# Patient Record
Sex: Female | Born: 1955
Health system: Southern US, Community
[De-identification: ages and names within clinical notes are randomized; demographics above are authoritative.]

## PROBLEM LIST (undated history)

## (undated) DIAGNOSIS — H3500 Unspecified background retinopathy: Secondary | ICD-10-CM

## (undated) DIAGNOSIS — I10 Essential (primary) hypertension: Secondary | ICD-10-CM

## (undated) DIAGNOSIS — E785 Hyperlipidemia, unspecified: Secondary | ICD-10-CM

## (undated) DIAGNOSIS — E119 Type 2 diabetes mellitus without complications: Secondary | ICD-10-CM

## (undated) DIAGNOSIS — M1990 Unspecified osteoarthritis, unspecified site: Secondary | ICD-10-CM

## (undated) DIAGNOSIS — Z8489 Family history of other specified conditions: Secondary | ICD-10-CM

## (undated) DIAGNOSIS — E049 Nontoxic goiter, unspecified: Secondary | ICD-10-CM

## (undated) DIAGNOSIS — Z9889 Other specified postprocedural states: Secondary | ICD-10-CM

## (undated) DIAGNOSIS — R112 Nausea with vomiting, unspecified: Secondary | ICD-10-CM

## (undated) HISTORY — PX: EYE SURGERY: SHX253

## (undated) HISTORY — PX: TUBAL LIGATION: SHX77

## (undated) HISTORY — PX: TONSILLECTOMY: SUR1361

## (undated) HISTORY — PX: ABDOMINAL HYSTERECTOMY: SHX81

## (undated) HISTORY — PX: BACK SURGERY: SHX140

## (undated) HISTORY — DX: Hyperlipidemia, unspecified: E78.5

---

## 2002-04-25 ENCOUNTER — Encounter: Admission: RE | Admit: 2002-04-25 | Discharge: 2002-04-25 | Payer: Self-pay | Admitting: Unknown Physician Specialty

## 2002-04-25 ENCOUNTER — Encounter: Payer: Self-pay | Admitting: Unknown Physician Specialty

## 2002-05-08 ENCOUNTER — Ambulatory Visit (HOSPITAL_COMMUNITY): Admission: RE | Admit: 2002-05-08 | Discharge: 2002-05-08 | Payer: Self-pay | Admitting: Neurosurgery

## 2002-05-08 ENCOUNTER — Encounter: Payer: Self-pay | Admitting: Neurosurgery

## 2012-04-03 HISTORY — PX: CATARACT EXTRACTION: SUR2

## 2013-11-03 ENCOUNTER — Other Ambulatory Visit (HOSPITAL_COMMUNITY): Payer: Self-pay

## 2013-11-14 ENCOUNTER — Inpatient Hospital Stay: Admit: 2013-11-14 | Payer: Self-pay | Admitting: Orthopedic Surgery

## 2013-11-14 SURGERY — ARTHROPLASTY, HIP, TOTAL,POSTERIOR APPROACH
Anesthesia: General | Laterality: Right

## 2013-11-20 ENCOUNTER — Other Ambulatory Visit (HOSPITAL_COMMUNITY): Payer: Self-pay | Admitting: Internal Medicine

## 2013-11-20 DIAGNOSIS — Z139 Encounter for screening, unspecified: Secondary | ICD-10-CM

## 2013-11-20 DIAGNOSIS — Z13820 Encounter for screening for osteoporosis: Secondary | ICD-10-CM

## 2013-11-26 ENCOUNTER — Other Ambulatory Visit (HOSPITAL_COMMUNITY): Payer: Self-pay

## 2014-04-02 NOTE — H&P (Signed)
Braylee Lal/[facility] [address] [place], [place] [address] [phone] A Division of [facility]  Ninetta Lights, M.D.   Robert A. Noemi Chapel, M.D.   Faythe Casa, M.D.   Johnny Bridge, M.D.   Almedia Balls, M.D Ernesta Amble. Percell Miller, M.D.  Joseph Pierini, M.D.  Lanier Prude, M.D.    Verner Chol, M.D. Mary L. Fenton Malling, PA-C  Kirstin A. Shepperson, PA-C  Josh Little [place], PA-C [place], [place]                                                                    RE: [name], [name]                                    [id number]      DOB: October 26, 1955 PROGRESS NOTE: 03-23-14 History of present illness: Patient presents for follow up of right hip and right low back pain.  She is a 58 year-old female with a history of low back diskectomy in 2004.  In the last several months she has been having right hip pain which started about a year ago.  In May she saw Dr. French Ana who referred for intraarticular right hip injections to treat and help control pain for her severe osteoarthritis of the hip.  Patient reports good clinical response to the injection in May with reduction of pain and improvement in function for at least three months.  She had a repeat injection in early November that she reports she did not get any clinical benefit from.  She had a reduction in some of the sharp pain in her right hip with this injection, but continues to have a dull ache which she localizes to her buttocks and lateral hip joint.  Patient is interested in pursuing total hip replacement as this was the plan over the summer and this was postponed.   Review of systems: Otherwise negative.   Please see associated documentation for this clinic visit for further past medical, family, surgical and social history, review of systems, and exam findings as this was reviewed by me.  EXAMINATION: Well-developed, well-nourished female.  Alert and oriented x 3.   Examination of her right lower extremity reveals pain with hip grind.  Mild decreased range of motion in flexion of the hip.  Normal in extension.  She has a negative straight leg raise with no radiculopathy.  No numbness or tingling into the leg.  Patient has global weakness bilaterally with hip flexion, extension, internal and external rotation, about 4/5.  No significant tenderness to palpation over the greater trochanter, buttocks or ischial tuberosity.  Neurovascularly intact with normal dorsal pedal pulses and sensation.  Although her muscles are globally weak from deconditioning, she has symmetric strength   X-RAYS: Review of patient' previous x-rays of her right hip reveal severe joint space narrowing and degenerative joint changes with CAM and pincer deformities.    ASSESSMENT: Subsequent visit for chronic non-traumatic primary right hip end stage degenerative joint disease.    PLAN: Discussed at length with patient that based on her clinical history and clinical diagnostic improvement from previous hip injection, this is  likely the source of her pain.  We discussed the process of going through hip replacement surgery and recovery.  Patient verbalized understanding of the risks and benefits of this procedure.  She will obtain clearance from her primary care physician before proceeding with surgery.  We will setup a surgery date today and patient will follow up for her pre-op visit in the next couple of months.    Ernesta Amble.  Percell Miller, M.D.  Electronically verified by Ernesta Amble. Percell Miller, M.D. TDM(DMD):jjh D 03-24-14 T 03-25-14

## 2014-04-10 ENCOUNTER — Encounter (HOSPITAL_COMMUNITY): Payer: Self-pay

## 2014-04-10 ENCOUNTER — Encounter (HOSPITAL_COMMUNITY)
Admission: RE | Admit: 2014-04-10 | Discharge: 2014-04-10 | Disposition: A | Discharge status: Home / Self Care | Payer: 59 | Source: Ambulatory Visit | Attending: Orthopedic Surgery | Admitting: Orthopedic Surgery

## 2014-04-10 DIAGNOSIS — I252 Old myocardial infarction: Secondary | ICD-10-CM | POA: Diagnosis not present

## 2014-04-10 DIAGNOSIS — R000 Tachycardia, unspecified: Secondary | ICD-10-CM | POA: Insufficient documentation

## 2014-04-10 DIAGNOSIS — E119 Type 2 diabetes mellitus without complications: Secondary | ICD-10-CM | POA: Diagnosis not present

## 2014-04-10 DIAGNOSIS — I10 Essential (primary) hypertension: Secondary | ICD-10-CM | POA: Diagnosis not present

## 2014-04-10 DIAGNOSIS — Z01818 Encounter for other preprocedural examination: Secondary | ICD-10-CM | POA: Diagnosis present

## 2014-04-10 DIAGNOSIS — N390 Urinary tract infection, site not specified: Secondary | ICD-10-CM | POA: Insufficient documentation

## 2014-04-10 HISTORY — DX: Essential (primary) hypertension: I10

## 2014-04-10 HISTORY — DX: Other specified postprocedural states: R11.2

## 2014-04-10 HISTORY — DX: Other specified postprocedural states: Z98.890

## 2014-04-10 HISTORY — DX: Unspecified osteoarthritis, unspecified site: M19.90

## 2014-04-10 HISTORY — DX: Nontoxic goiter, unspecified: E04.9

## 2014-04-10 HISTORY — DX: Type 2 diabetes mellitus without complications: E11.9

## 2014-04-10 LAB — PROTIME-INR
INR: 0.96 (ref 0.00–1.49)
Prothrombin Time: 12.8 seconds (ref 11.6–15.2)

## 2014-04-10 LAB — CBC
HEMATOCRIT: 41.7 % (ref 36.0–46.0)
Hemoglobin: 13.7 g/dL (ref 12.0–15.0)
MCH: 29.3 pg (ref 26.0–34.0)
MCHC: 32.9 g/dL (ref 30.0–36.0)
MCV: 89.3 fL (ref 78.0–100.0)
PLATELETS: 341 10*3/uL (ref 150–400)
RBC: 4.67 MIL/uL (ref 3.87–5.11)
RDW: 13.8 % (ref 11.5–15.5)
WBC: 10.1 10*3/uL (ref 4.0–10.5)

## 2014-04-10 LAB — URINALYSIS, ROUTINE W REFLEX MICROSCOPIC
Bilirubin Urine: NEGATIVE
GLUCOSE, UA: NEGATIVE mg/dL
HGB URINE DIPSTICK: NEGATIVE
Ketones, ur: 15 mg/dL — AB
NITRITE: POSITIVE — AB
PROTEIN: NEGATIVE mg/dL
Specific Gravity, Urine: 1.019 (ref 1.005–1.030)
Urobilinogen, UA: 0.2 mg/dL (ref 0.0–1.0)
pH: 5.5 (ref 5.0–8.0)

## 2014-04-10 LAB — BASIC METABOLIC PANEL
ANION GAP: 14 (ref 5–15)
BUN: 14 mg/dL (ref 6–23)
CO2: 27 mmol/L (ref 19–32)
Calcium: 9.9 mg/dL (ref 8.4–10.5)
Chloride: 97 mEq/L (ref 96–112)
Creatinine, Ser: 0.8 mg/dL (ref 0.50–1.10)
GFR calc Af Amer: >90 mL/min (ref >90)
GFR calc non Af Amer: 79 mL/min — ABNORMAL LOW (ref >90)
Glucose, Bld: 104 mg/dL — ABNORMAL HIGH (ref 70–99)
POTASSIUM: 3.6 mmol/L (ref 3.5–5.1)
Sodium: 138 mmol/L (ref 135–145)

## 2014-04-10 LAB — URINE MICROSCOPIC-ADD ON

## 2014-04-10 LAB — SURGICAL PCR SCREEN
MRSA, PCR: NEGATIVE
STAPHYLOCOCCUS AUREUS: NEGATIVE

## 2014-04-10 LAB — TYPE AND SCREEN
ABO/RH(D): A NEG
Antibody Screen: NEGATIVE

## 2014-04-10 LAB — APTT: aPTT: 30 seconds (ref 24–37)

## 2014-04-10 LAB — ABO/RH: ABO/RH(D): A NEG

## 2014-04-10 NOTE — Progress Notes (Signed)
Pcp is Dr Delphina Cahill Denies seeing a Cardiologist. Denies having a stress test, echo, or card cath. Denies having a recent Ekg or CXR Reports her cbg's run 88-120 fasting

## 2014-04-10 NOTE — Pre-Procedure Instructions (Signed)
[name]  04/10/2014   Your procedure is scheduled on:  Jan 19 at 730  Report to [facility] Admitting at 530 AM.  Call this number if you have problems the morning of surgery: [phone]   Remember:   Do not eat food or drink liquids after midnight.   Take these medicines the morning of surgery with A SIP OF WATER: Tylenol if needed for pain  Stop taking aspirin, Aleve, Ibuprofen, BC's, Goody's, Herbal medications, and Fish Oil   Do not wear jewelry, make-up or nail polish.  Do not wear lotions, powders, or perfumes. You may wear deodorant.  Do not shave 48 hours prior to surgery. Men may shave face and neck.  Do not bring valuables to the hospital.  [facility] is not responsible  for any belongings or valuables.               Contacts, dentures or bridgework may not be worn into surgery.  Leave suitcase in the car. After surgery it may be brought to your room.  For patients admitted to the hospital, discharge time is determined by your treatment team.               Patients discharged the day of surgery will not be allowed to drive home.    Special Instructions: [place] - Preparing for Surgery  Before surgery, you can play an important role.  Because skin is not sterile, your skin needs to be as free of germs as possible.  You can reduce the number of germs on you skin by washing with CHG (chlorahexidine gluconate) soap before surgery.  CHG is an antiseptic cleaner which kills germs and bonds with the skin to continue killing germs even after washing.  Please DO NOT use if you have an allergy to CHG or antibacterial soaps.  If your skin becomes reddened/irritated stop using the CHG and inform your nurse when you arrive at Short Stay.  Do not shave (including legs and underarms) for at least 48 hours prior to the first CHG shower.  You may shave your face.  Please follow these instructions carefully:   1.  Shower with CHG Soap the night before surgery and  the  morning of Surgery.  2.  If you choose to wash your hair, wash your hair first as usual with your normal shampoo.  3.  After you shampoo, rinse your hair and body thoroughly to remove the Shampoo.  4.  Use CHG as you would any other liquid soap.  You can apply chg directly to the skin and wash gently with scrungie or a clean washcloth.  5.  Apply the CHG Soap to your body ONLY FROM THE NECK DOWN.  Do not use on open wounds or open sores.  Avoid contact with your eyes, ears, mouth and genitals (private parts).  Wash genitals (private parts)  with your normal soap.  6.  Wash thoroughly, paying special attention to the area where your surgery  will be performed.  7.  Thoroughly rinse your body with warm water from the neck down.  8.  DO NOT shower/wash with your normal soap after using and rinsing off  the CHG Soap.  9.  Pat yourself dry with a clean towel.            10.  Wear clean pajamas.            11.  Place clean sheets on your bed the night of  your first shower and do not sleep with pets.  Day of Surgery  Do not apply any lotions/deoderants the morning of surgery.  Please wear clean clothes to the hospital/surgery center.      Please read over the following fact sheets that you were given: Pain Booklet, Coughing and Deep Breathing, Blood Transfusion Information, MRSA Information and Surgical Site Infection Prevention

## 2014-04-13 NOTE — Progress Notes (Addendum)
Anesthesia Chart Review:  Pt is 59 year old female scheduled for R total hip arthroplasty on 04/21/2014 with Dr. Alain Marion.   PCP is Dr. Delphina Cahill in [place].   PMH includes: DM, HTN.   Medication includes: lisinopril, glipizide, Janumet  Preoperative labs reviewed.  Pt appears to have UTI. Notified Kelly in Dr. Debroah Loop office.   EKG: Sinus tachycardia. Possible Left atrial enlargement. Possible Inferior infarct , age undetermined. No previous available in our system for comparison. Have requested EKG from PCP's office.   Willeen Cass, FNP-BC [facility] Short Stay Surgical Center/Anesthesiology Phone: [phone] 04/13/2014 4:36 PM  Addendum:   Called and spoke with pt. She denies any SOB or chest tightness/pain with activity.   No old EKG is available.   Pt has medical clearance from PCP on chart.   Discussed with Dr. Glennon Mac.  If no changes, I anticipate pt can proceed with surgery as scheduled.   Willeen Cass, FNP-BC [facility] Short Stay Surgical Center/Anesthesiology Phone: [phone] 04/15/2014 4:26 PM  Addendum:  Received 01/31/11 EKG from the [facility].  EKG from 01/31/11 also showed possible inferior infarct and poor anterior r wave progression.  George Hugh [facility] Short Stay Center/Anesthesiology Phone [phone] 04/17/2014 11:46 AM

## 2014-04-14 ENCOUNTER — Encounter (HOSPITAL_COMMUNITY): Payer: Self-pay

## 2014-04-20 MED ORDER — ACETAMINOPHEN 500 MG PO TABS
1000.0000 mg | ORAL_TABLET | Freq: Once | ORAL | Status: AC
  Administered 2014-04-21: 1000 mg via ORAL
  Filled 2014-04-20: qty 2

## 2014-04-20 MED ORDER — DEXTROSE-NACL 5-0.45 % IV SOLN: 100.0000 mL/h | INTRAVENOUS | Status: DC

## 2014-04-20 MED ORDER — CEFAZOLIN SODIUM-DEXTROSE 2-3 GM-% IV SOLR
2.0000 g | INTRAVENOUS | Status: AC
Start: 2014-04-21 — End: 2014-04-21
  Administered 2014-04-21: 2 g via INTRAVENOUS
  Filled 2014-04-20: qty 50

## 2014-04-20 NOTE — Anesthesia Preprocedure Evaluation (Addendum)
Anesthesia Evaluation  Patient identified by MRN, date of birth, ID band Patient awake    Reviewed: Allergy & Precautions, NPO status , Patient's Chart, lab work & pertinent test results, reviewed documented beta blocker date and time   History of Anesthesia Complications (+) PONV  Airway Mallampati: III  TM Distance: <3 FB Neck ROM: Limited  Mouth opening: Limited Mouth Opening  Dental  (+) Dental Advisory Given Pt with large goiter that causes tracheal deviation of the right- she has not received any work up or treatment since it was first diagnosed in 1998 as per note from [place] [facility] department pt declined soft tissue untrasound of neck 06/2013- discussed AW and anesthetic concerns with Dr Tresa Moore and Dr Percell Miller- both agree to proceed with case:   Pulmonary neg pulmonary ROS,          Cardiovascular hypertension, Pt. on medications Rhythm:Regular     Neuro/Psych negative neurological ROS     GI/Hepatic Neg liver ROS,   Endo/Other  diabetes, Type 2  Renal/GU UA possible UTI     Musculoskeletal   Abdominal (+)  Abdomen: soft.    Peds  Hematology   Anesthesia Other Findings   Reproductive/Obstetrics                          Anesthesia Physical Anesthesia Plan  ASA: II  Anesthesia Plan: Regional   Post-op Pain Management:    Induction:   Airway Management Planned:   Additional Equipment:   Intra-op Plan:   Post-operative Plan:   Informed Consent: I have reviewed the patients History and Physical, chart, labs and discussed the procedure including the risks, benefits and alternatives for the proposed anesthesia with the patient or authorized representative who has indicated his/her understanding and acceptance.     Plan Discussed with:   Anesthesia Plan Comments: (Pre op lab possible for UTI, need to see if it has been addressed- spinal as concerns -  difficult AW due to Goiter- pt reports finished ABX for UTI)       Anesthesia Quick Evaluation

## 2014-04-21 ENCOUNTER — Inpatient Hospital Stay (HOSPITAL_COMMUNITY)
Admission: RE | Admit: 2014-04-21 | Discharge: 2014-04-22 | DRG: 470 | Disposition: A | Discharge status: Home Health | Payer: 59 | Source: Ambulatory Visit | Attending: Orthopedic Surgery | Admitting: Orthopedic Surgery

## 2014-04-21 ENCOUNTER — Inpatient Hospital Stay (HOSPITAL_COMMUNITY): Payer: 59 | Admitting: Certified Registered"

## 2014-04-21 ENCOUNTER — Encounter (HOSPITAL_COMMUNITY): Payer: Self-pay | Admitting: *Deleted

## 2014-04-21 ENCOUNTER — Inpatient Hospital Stay (HOSPITAL_COMMUNITY): Payer: 59 | Admitting: Emergency Medicine

## 2014-04-21 ENCOUNTER — Encounter (HOSPITAL_COMMUNITY)
Admission: RE | Disposition: A | Discharge status: Home Health | Payer: Self-pay | Source: Ambulatory Visit | Attending: Orthopedic Surgery

## 2014-04-21 ENCOUNTER — Inpatient Hospital Stay (HOSPITAL_COMMUNITY): Payer: 59

## 2014-04-21 DIAGNOSIS — Z7982 Long term (current) use of aspirin: Secondary | ICD-10-CM

## 2014-04-21 DIAGNOSIS — M1611 Unilateral primary osteoarthritis, right hip: Principal | ICD-10-CM | POA: Diagnosis present

## 2014-04-21 DIAGNOSIS — I10 Essential (primary) hypertension: Secondary | ICD-10-CM | POA: Diagnosis present

## 2014-04-21 DIAGNOSIS — M1990 Unspecified osteoarthritis, unspecified site: Secondary | ICD-10-CM | POA: Diagnosis present

## 2014-04-21 DIAGNOSIS — Z79899 Other long term (current) drug therapy: Secondary | ICD-10-CM | POA: Diagnosis not present

## 2014-04-21 DIAGNOSIS — Z9889 Other specified postprocedural states: Secondary | ICD-10-CM

## 2014-04-21 DIAGNOSIS — E119 Type 2 diabetes mellitus without complications: Secondary | ICD-10-CM | POA: Diagnosis present

## 2014-04-21 DIAGNOSIS — M25551 Pain in right hip: Secondary | ICD-10-CM | POA: Diagnosis present

## 2014-04-21 HISTORY — PX: TOTAL HIP ARTHROPLASTY: SHX124

## 2014-04-21 HISTORY — DX: Unspecified background retinopathy: H35.00

## 2014-04-21 LAB — GLUCOSE, CAPILLARY
GLUCOSE-CAPILLARY: 135 mg/dL — AB (ref 70–99)
GLUCOSE-CAPILLARY: 308 mg/dL — AB (ref 70–99)
GLUCOSE-CAPILLARY: 230 mg/dL — AB (ref 70–99)
Glucose-Capillary: 144 mg/dL — ABNORMAL HIGH (ref 70–99)
Glucose-Capillary: 220 mg/dL — ABNORMAL HIGH (ref 70–99)

## 2014-04-21 SURGERY — ARTHROPLASTY, HIP, TOTAL, ANTERIOR APPROACH
Anesthesia: Spinal | Site: Hip | Laterality: Right

## 2014-04-21 MED ORDER — MEPERIDINE HCL 25 MG/ML IJ SOLN: 6.2500 mg | INTRAMUSCULAR | Status: DC | PRN

## 2014-04-21 MED ORDER — METOCLOPRAMIDE HCL 10 MG PO TABS: 5.0000 mg | ORAL_TABLET | Freq: Three times a day (TID) | ORAL | Status: DC | PRN

## 2014-04-21 MED ORDER — DEXAMETHASONE SODIUM PHOSPHATE 10 MG/ML IJ SOLN
10.0000 mg | Freq: Once | INTRAMUSCULAR | Status: AC
  Administered 2014-04-22: 10 mg via INTRAVENOUS
  Filled 2014-04-21: qty 1

## 2014-04-21 MED ORDER — METHOCARBAMOL 1000 MG/10ML IJ SOLN
500.0000 mg | INTRAVENOUS | Status: DC
  Filled 2014-04-21: qty 5

## 2014-04-21 MED ORDER — MENTHOL 3 MG MT LOZG: 1.0000 | LOZENGE | OROMUCOSAL | Status: DC | PRN

## 2014-04-21 MED ORDER — EPHEDRINE SULFATE 50 MG/ML IJ SOLN
INTRAMUSCULAR | Status: AC
  Filled 2014-04-21: qty 1

## 2014-04-21 MED ORDER — ASPIRIN EC 325 MG PO TBEC
325.0000 mg | DELAYED_RELEASE_TABLET | Freq: Every day | ORAL | Status: DC
  Administered 2014-04-22: 325 mg via ORAL
  Filled 2014-04-21 (×2): qty 1

## 2014-04-21 MED ORDER — METFORMIN HCL 500 MG PO TABS
1000.0000 mg | ORAL_TABLET | Freq: Two times a day (BID) | ORAL | Status: DC
  Administered 2014-04-21 – 2014-04-22 (×2): 1000 mg via ORAL
  Filled 2014-04-21 (×4): qty 2

## 2014-04-21 MED ORDER — LACTATED RINGERS IV SOLN
INTRAVENOUS | Status: DC | PRN
  Administered 2014-04-21 (×3): via INTRAVENOUS

## 2014-04-21 MED ORDER — SODIUM CHLORIDE 0.9 % IV SOLN
10.0000 mg | INTRAVENOUS | Status: DC | PRN
  Administered 2014-04-21: 20 ug/min via INTRAVENOUS

## 2014-04-21 MED ORDER — STERILE WATER FOR INJECTION IJ SOLN
INTRAMUSCULAR | Status: AC
  Filled 2014-04-21: qty 10

## 2014-04-21 MED ORDER — SCOPOLAMINE 1 MG/3DAYS TD PT72
1.0000 | MEDICATED_PATCH | TRANSDERMAL | Status: DC
  Administered 2014-04-21: 1.5 mg via TRANSDERMAL

## 2014-04-21 MED ORDER — PROPOFOL INFUSION 10 MG/ML OPTIME
INTRAVENOUS | Status: DC | PRN
  Administered 2014-04-21: 25 ug/kg/min via INTRAVENOUS

## 2014-04-21 MED ORDER — ROCURONIUM BROMIDE 50 MG/5ML IV SOLN
INTRAVENOUS | Status: AC
  Filled 2014-04-21: qty 1

## 2014-04-21 MED ORDER — LIDOCAINE HCL (CARDIAC) 20 MG/ML IV SOLN
INTRAVENOUS | Status: AC
  Filled 2014-04-21: qty 5

## 2014-04-21 MED ORDER — SODIUM CHLORIDE 0.9 % IV SOLN
1000.0000 mg | INTRAVENOUS | Status: DC
  Filled 2014-04-21: qty 10

## 2014-04-21 MED ORDER — 0.9 % SODIUM CHLORIDE (POUR BTL) OPTIME
TOPICAL | Status: DC | PRN
  Administered 2014-04-21: 1000 mL

## 2014-04-21 MED ORDER — DOCUSATE SODIUM 100 MG PO CAPS: 100.0000 mg | ORAL_CAPSULE | Freq: Two times a day (BID) | ORAL | Status: DC

## 2014-04-21 MED ORDER — MELOXICAM 15 MG PO TABS: 15.0000 mg | ORAL_TABLET | Freq: Every day | ORAL | Status: DC

## 2014-04-21 MED ORDER — MIDAZOLAM HCL 5 MG/5ML IJ SOLN
INTRAMUSCULAR | Status: DC | PRN
  Administered 2014-04-21 (×2): 1 mg via INTRAVENOUS

## 2014-04-21 MED ORDER — MIDAZOLAM HCL 2 MG/2ML IJ SOLN
INTRAMUSCULAR | Status: AC
  Filled 2014-04-21: qty 2

## 2014-04-21 MED ORDER — SUCCINYLCHOLINE CHLORIDE 20 MG/ML IJ SOLN
INTRAMUSCULAR | Status: AC
  Filled 2014-04-21: qty 1

## 2014-04-21 MED ORDER — LINAGLIPTIN 5 MG PO TABS
5.0000 mg | ORAL_TABLET | Freq: Every day | ORAL | Status: DC
  Filled 2014-04-21 (×2): qty 1

## 2014-04-21 MED ORDER — HYDROCODONE-ACETAMINOPHEN 5-325 MG PO TABS
1.0000 | ORAL_TABLET | ORAL | Status: DC | PRN
  Administered 2014-04-21: 2 via ORAL
  Administered 2014-04-21: 1 via ORAL
  Administered 2014-04-22 (×4): 2 via ORAL
  Filled 2014-04-21: qty 1
  Filled 2014-04-21 (×5): qty 2

## 2014-04-21 MED ORDER — ONDANSETRON HCL 4 MG PO TABS: 4.0000 mg | ORAL_TABLET | Freq: Four times a day (QID) | ORAL | Status: DC | PRN

## 2014-04-21 MED ORDER — FENTANYL CITRATE 0.05 MG/ML IJ SOLN
INTRAMUSCULAR | Status: DC | PRN
  Administered 2014-04-21 (×10): 25 ug via INTRAVENOUS

## 2014-04-21 MED ORDER — SITAGLIPTIN PHOS-METFORMIN HCL 50-1000 MG PO TABS: 1.0000 | ORAL_TABLET | Freq: Two times a day (BID) | ORAL | Status: DC

## 2014-04-21 MED ORDER — METOCLOPRAMIDE HCL 5 MG/ML IJ SOLN
5.0000 mg | Freq: Three times a day (TID) | INTRAMUSCULAR | Status: DC | PRN
Start: 2014-04-21 — End: 2014-04-22

## 2014-04-21 MED ORDER — LISINOPRIL 40 MG PO TABS
40.0000 mg | ORAL_TABLET | Freq: Every day | ORAL | Status: DC
  Administered 2014-04-21 – 2014-04-22 (×2): 40 mg via ORAL
  Filled 2014-04-21 (×2): qty 1

## 2014-04-21 MED ORDER — ONDANSETRON HCL 4 MG/2ML IJ SOLN: 4.0000 mg | Freq: Four times a day (QID) | INTRAMUSCULAR | Status: DC | PRN

## 2014-04-21 MED ORDER — ALBUMIN HUMAN 5 % IV SOLN
INTRAVENOUS | Status: DC | PRN
  Administered 2014-04-21: 10:00:00 via INTRAVENOUS

## 2014-04-21 MED ORDER — SODIUM CHLORIDE 0.9 % IJ SOLN
INTRAMUSCULAR | Status: DC | PRN
  Administered 2014-04-21: 20 mL

## 2014-04-21 MED ORDER — DEXAMETHASONE SODIUM PHOSPHATE 10 MG/ML IJ SOLN
INTRAMUSCULAR | Status: DC | PRN
  Administered 2014-04-21: 10 mg via INTRAVENOUS

## 2014-04-21 MED ORDER — PROPOFOL 10 MG/ML IV BOLUS
INTRAVENOUS | Status: AC
  Filled 2014-04-21: qty 20

## 2014-04-21 MED ORDER — ACETAMINOPHEN 325 MG PO TABS: 650.0000 mg | ORAL_TABLET | Freq: Four times a day (QID) | ORAL | Status: DC | PRN

## 2014-04-21 MED ORDER — FENTANYL CITRATE 0.05 MG/ML IJ SOLN
INTRAMUSCULAR | Status: AC
  Administered 2014-04-21: 50 ug via INTRAVENOUS
  Filled 2014-04-21: qty 2

## 2014-04-21 MED ORDER — FENTANYL CITRATE 0.05 MG/ML IJ SOLN
INTRAMUSCULAR | Status: AC
  Filled 2014-04-21: qty 5

## 2014-04-21 MED ORDER — CHLORHEXIDINE GLUCONATE 4 % EX LIQD
60.0000 mL | Freq: Once | CUTANEOUS | Status: DC
  Filled 2014-04-21: qty 60

## 2014-04-21 MED ORDER — FENTANYL CITRATE 0.05 MG/ML IJ SOLN
25.0000 ug | INTRAMUSCULAR | Status: DC | PRN
  Administered 2014-04-21 (×2): 50 ug via INTRAVENOUS

## 2014-04-21 MED ORDER — BUPIVACAINE LIPOSOME 1.3 % IJ SUSP
20.0000 mL | INTRAMUSCULAR | Status: AC
  Administered 2014-04-21: 20 mL
  Filled 2014-04-21: qty 20

## 2014-04-21 MED ORDER — PHENOL 1.4 % MT LIQD: 1.0000 | OROMUCOSAL | Status: DC | PRN

## 2014-04-21 MED ORDER — DEXTROSE 5 % IV SOLN: 500.0000 mg | Freq: Four times a day (QID) | INTRAVENOUS | Status: DC | PRN

## 2014-04-21 MED ORDER — CEFAZOLIN SODIUM-DEXTROSE 2-3 GM-% IV SOLR
2.0000 g | Freq: Four times a day (QID) | INTRAVENOUS | Status: AC
  Administered 2014-04-21 (×2): 2 g via INTRAVENOUS
  Filled 2014-04-21 (×2): qty 50

## 2014-04-21 MED ORDER — METHOCARBAMOL 500 MG PO TABS
ORAL_TABLET | ORAL | Status: AC
  Administered 2014-04-21: 500 mg via ORAL
  Filled 2014-04-21: qty 1

## 2014-04-21 MED ORDER — ONDANSETRON HCL 4 MG/2ML IJ SOLN
INTRAMUSCULAR | Status: DC | PRN
  Administered 2014-04-21: 4 mg via INTRAVENOUS

## 2014-04-21 MED ORDER — ONDANSETRON HCL 4 MG PO TABS: 4.0000 mg | ORAL_TABLET | Freq: Three times a day (TID) | ORAL | Status: DC | PRN

## 2014-04-21 MED ORDER — ACETAMINOPHEN 650 MG RE SUPP: 650.0000 mg | Freq: Four times a day (QID) | RECTAL | Status: DC | PRN

## 2014-04-21 MED ORDER — METHOCARBAMOL 500 MG PO TABS
500.0000 mg | ORAL_TABLET | Freq: Four times a day (QID) | ORAL | Status: DC | PRN
  Administered 2014-04-21 – 2014-04-22 (×2): 500 mg via ORAL
  Filled 2014-04-21: qty 1

## 2014-04-21 MED ORDER — MORPHINE SULFATE 2 MG/ML IJ SOLN: 2.0000 mg | INTRAMUSCULAR | Status: DC | PRN

## 2014-04-21 MED ORDER — PROMETHAZINE HCL 25 MG/ML IJ SOLN: 6.2500 mg | INTRAMUSCULAR | Status: DC | PRN

## 2014-04-21 MED ORDER — ASPIRIN EC 325 MG PO TBEC: 325.0000 mg | DELAYED_RELEASE_TABLET | Freq: Every day | ORAL | Status: DC

## 2014-04-21 MED ORDER — LINAGLIPTIN 5 MG PO TABS
5.0000 mg | ORAL_TABLET | Freq: Every day | ORAL | Status: DC
  Administered 2014-04-21: 5 mg via ORAL
  Filled 2014-04-21 (×2): qty 1

## 2014-04-21 MED ORDER — ONDANSETRON HCL 4 MG/2ML IJ SOLN
INTRAMUSCULAR | Status: AC
  Filled 2014-04-21: qty 2

## 2014-04-21 MED ORDER — DOCUSATE SODIUM 100 MG PO CAPS
100.0000 mg | ORAL_CAPSULE | Freq: Two times a day (BID) | ORAL | Status: DC
Start: 2014-04-21 — End: 2014-04-22
  Administered 2014-04-21 – 2014-04-22 (×3): 100 mg via ORAL
  Filled 2014-04-21 (×3): qty 1

## 2014-04-21 MED ORDER — TRANEXAMIC ACID 100 MG/ML IV SOLN
2000.0000 mg | Freq: Once | INTRAVENOUS | Status: AC
  Administered 2014-04-21: 2000 mg via TOPICAL
  Filled 2014-04-21: qty 20

## 2014-04-21 MED ORDER — GLIPIZIDE ER 2.5 MG PO TB24
2.5000 mg | ORAL_TABLET | Freq: Two times a day (BID) | ORAL | Status: DC
  Administered 2014-04-21 – 2014-04-22 (×3): 2.5 mg via ORAL
  Filled 2014-04-21 (×4): qty 1

## 2014-04-21 MED ORDER — SCOPOLAMINE 1 MG/3DAYS TD PT72
MEDICATED_PATCH | TRANSDERMAL | Status: AC
  Administered 2014-04-21: 1.5 mg via TRANSDERMAL
  Filled 2014-04-21: qty 1

## 2014-04-21 MED ORDER — HYDROCODONE-ACETAMINOPHEN 5-325 MG PO TABS: 1.0000 | ORAL_TABLET | ORAL | Status: DC | PRN

## 2014-04-21 MED ORDER — DEXTROSE-NACL 5-0.45 % IV SOLN
INTRAVENOUS | Status: DC
  Administered 2014-04-21: 18:00:00 via INTRAVENOUS

## 2014-04-21 SURGICAL SUPPLY — 64 items
BAG DECANTER FOR FLEXI CONT (MISCELLANEOUS) ×3 IMPLANT
BLADE SAW SGTL 18X1.27X75 (BLADE) ×2 IMPLANT
BLADE SAW SGTL 18X1.27X75MM (BLADE) ×1
CAPT HIP TOTAL 2 ×3 IMPLANT
COVER SURGICAL LIGHT HANDLE (MISCELLANEOUS) ×3 IMPLANT
DRAPE C-ARM 42X72 X-RAY (DRAPES) ×3 IMPLANT
DRAPE IMP U-DRAPE 54X76 (DRAPES) ×6 IMPLANT
DRAPE INCISE IOBAN 66X45 STRL (DRAPES) ×3 IMPLANT
DRAPE ORTHO SPLIT 77X108 STRL (DRAPES) ×4
DRAPE PROXIMA HALF (DRAPES) ×3 IMPLANT
DRAPE SURG 17X23 STRL (DRAPES) ×3 IMPLANT
DRAPE SURG ORHT 6 SPLT 77X108 (DRAPES) ×2 IMPLANT
DRAPE U-SHAPE 47X51 STRL (DRAPES) ×6 IMPLANT
DRSG AQUACEL AG ADV 3.5X10 (GAUZE/BANDAGES/DRESSINGS) ×3 IMPLANT
DURAPREP 26ML APPLICATOR (WOUND CARE) ×3 IMPLANT
ELECT BLADE 4.0 EZ CLEAN MEGAD (MISCELLANEOUS) ×6
ELECT CAUTERY BLADE 6.4 (BLADE) ×3 IMPLANT
ELECT REM PT RETURN 9FT ADLT (ELECTROSURGICAL) ×3
ELECTRODE BLDE 4.0 EZ CLN MEGD (MISCELLANEOUS) ×2 IMPLANT
ELECTRODE REM PT RTRN 9FT ADLT (ELECTROSURGICAL) ×1 IMPLANT
FACESHIELD WRAPAROUND (MASK) ×6 IMPLANT
GLOVE BIO SURGEON STRL SZ7 (GLOVE) ×3 IMPLANT
GLOVE BIO SURGEON STRL SZ7.5 (GLOVE) ×6 IMPLANT
GLOVE BIOGEL PI IND STRL 7.5 (GLOVE) ×1 IMPLANT
GLOVE BIOGEL PI IND STRL 8 (GLOVE) ×3 IMPLANT
GLOVE BIOGEL PI INDICATOR 7.5 (GLOVE) ×2
GLOVE BIOGEL PI INDICATOR 8 (GLOVE) ×6
GLOVE BIOGEL PI ORTHO PRO SZ7 (GLOVE) ×2
GLOVE BIOGEL PI ORTHO PRO SZ8 (GLOVE) ×2
GLOVE PI ORTHO PRO STRL SZ7 (GLOVE) ×1 IMPLANT
GLOVE PI ORTHO PRO STRL SZ8 (GLOVE) ×1 IMPLANT
GLOVE SURG ORTHO 8.0 STRL STRW (GLOVE) ×3 IMPLANT
GLOVE SURG SS PI 7.5 STRL IVOR (GLOVE) ×6 IMPLANT
GLOVE SURG SS PI 8.0 STRL IVOR (GLOVE) ×6 IMPLANT
GOWN STRL REUS W/ TWL LRG LVL3 (GOWN DISPOSABLE) ×3 IMPLANT
GOWN STRL REUS W/ TWL XL LVL3 (GOWN DISPOSABLE) ×1 IMPLANT
GOWN STRL REUS W/TWL LRG LVL3 (GOWN DISPOSABLE) ×6
GOWN STRL REUS W/TWL XL LVL3 (GOWN DISPOSABLE) ×2
KIT BASIN OR (CUSTOM PROCEDURE TRAY) ×3 IMPLANT
KIT ROOM TURNOVER OR (KITS) ×3 IMPLANT
LIQUID BAND (GAUZE/BANDAGES/DRESSINGS) ×3 IMPLANT
MANIFOLD NEPTUNE II (INSTRUMENTS) ×3 IMPLANT
MARKER SKIN DUAL TIP RULER LAB (MISCELLANEOUS) ×3 IMPLANT
NEEDLE 18GX1X1/2 (RX/OR ONLY) (NEEDLE) ×3 IMPLANT
NS IRRIG 1000ML POUR BTL (IV SOLUTION) ×3 IMPLANT
PACK TOTAL JOINT (CUSTOM PROCEDURE TRAY) ×3 IMPLANT
PACK UNIVERSAL I (CUSTOM PROCEDURE TRAY) ×3 IMPLANT
PAD ARMBOARD 7.5X6 YLW CONV (MISCELLANEOUS) ×6 IMPLANT
PENCIL BUTTON HOLSTER BLD 10FT (ELECTRODE) ×3 IMPLANT
SPONGE LAP 18X18 X RAY DECT (DISPOSABLE) IMPLANT
SUT MNCRL AB 4-0 PS2 18 (SUTURE) ×3 IMPLANT
SUT MON AB 2-0 CT1 36 (SUTURE) ×3 IMPLANT
SUT VIC AB 0 CT1 27 (SUTURE) ×2
SUT VIC AB 0 CT1 27XBRD ANBCTR (SUTURE) ×1 IMPLANT
SUT VIC AB 1 CT1 27 (SUTURE) ×2
SUT VIC AB 1 CT1 27XBRD ANBCTR (SUTURE) ×1 IMPLANT
SYR 50ML LL SCALE MARK (SYRINGE) ×3 IMPLANT
TOWEL OR 17X24 6PK STRL BLUE (TOWEL DISPOSABLE) ×3 IMPLANT
TOWEL OR 17X26 10 PK STRL BLUE (TOWEL DISPOSABLE) ×3 IMPLANT
TOWEL OR NON WOVEN STRL DISP B (DISPOSABLE) ×3 IMPLANT
TRAY CATH 16FR W/PLASTIC CATH (SET/KITS/TRAYS/PACK) ×3 IMPLANT
TUBE CONNECTING 12'X1/4 (SUCTIONS) ×1
TUBE CONNECTING 12X1/4 (SUCTIONS) ×2 IMPLANT
YANKAUER SUCT BULB TIP NO VENT (SUCTIONS) ×3 IMPLANT

## 2014-04-21 NOTE — Op Note (Signed)
04/21/2014  10:16 AM  PATIENT:  [name]   MRN: [id number]  PRE-OPERATIVE DIAGNOSIS:  Osteoarthritis Right HIp  POST-OPERATIVE DIAGNOSIS:  Osteoarthritis Right HIp  PROCEDURE:  Procedure(s): RIGHT TOTAL HIP ARTHROPLASTY ANTERIOR APPROACH  PREOPERATIVE INDICATIONS:    [name] is an 59 y.o. female who has a diagnosis of <principal problem not specified> and elected for surgical management after failing conservative treatment.  The risks benefits and alternatives were discussed with the patient including but not limited to the risks of nonoperative treatment, versus surgical intervention including infection, bleeding, nerve injury, periprosthetic fracture, the need for revision surgery, dislocation, leg length discrepancy, blood clots, cardiopulmonary complications, morbidity, mortality, among others, and they were willing to proceed.     OPERATIVE REPORT     SURGEON:   MURPHY, TIMOTHY, D, MD    ASSISTANT:  Brandon Parry, OPA, He was necessary for efficiency and safety of the case.     ANESTHESIA:  General    COMPLICATIONS:  None.     COMPONENTS:  Stryker acolade fit femur size 2 with a 36 mm -2.5 head ball and a PSL acetabular shell size 48 with a  polyethylene liner    PROCEDURE IN DETAIL:   The patient was met in the holding area and  identified.  The appropriate hip was identified and marked at the operative site.  The patient was then transported to the OR  and  placed under general anesthesia.  At that point, the patient was  placed in the supine position and  secured to the operating room table and all bony prominences padded. He received pre-operative antibiotics    The operative lower extremity was prepped from the iliac crest to the distal leg.  Sterile draping was performed.  Time out was performed prior to incision.      Skin incision was made just 2 cm lateral to the ASIS  extending in line with the tensor fascia lata. Electrocautery was used to control  all bleeders. I dissected down sharply to the fascia of the tensor fascia lata was confirmed that the muscle fibers beneath were running posteriorly. I then incised the fascia over the superficial tensor fascia lata in line with the incision. The fascia was elevated off the anterior aspect of the muscle the muscle was retracted posteriorly and protected throughout the case. I then used electrocautery to incise the tensor fascia lata fascia control and all bleeders. Immediately visible was the fat over top of the anterior neck and capsule.  I removed the anterior fat from the capsule and elevated the rectus muscle off of the anterior capsule. I then removed a large time of capsule. The retractors were then placed over the anterior acetabulum as well as around the superior and inferior neck.  I then removed a section of the femoral neck and a napkin ring fashion. Then used the power course to remove the femoral head from the acetabulum and thoroughly irrigated the acetabulum. I sized the femoral head.    I then exposed the deep acetabulum, cleared out any tissue including the ligamentum teres.   After adequate visualization, I excised the labrum, and then sequentially reamed.  I placed the trial acetabulum, which seated nicely, and then impacted the real cup into place.  Appropriate version and inclination was confirmed clinically matching their bony anatomy, and also with the use transverse acetabular ligament.  I placed 2 16 mm screw in the posterior/superio position with an excellent bite.    I then   placed the polyethylene liner in place  I then abducted the leg and released the external rotators from the posterior femur allowing it to be easily delivered up lateral and anterior to the acetabulum for preparation of the femoral canal.    I then prepared the proximal femur using the cookie-cutter and then sequentially reamed and broached.  A trial broach, neck, and head was utilized, and I reduced the  hip and it was found to have excellent stability with functional range of motion..  I then impacted the real femoral prosthesis into place into the appropriate version, slightly anteverted to the normal anatomy, and I impacted the real head ball into place. The hip was then reduced and taken through functional range of motion and found to have excellent stability. Leg lengths were restored.   I placed a TXA soaked sponge in her wound for 35mn then removed it  I then irrigated the hip copiously again with, and repaired the fascia with Vicryl, followed by monocryl for the subcutaneous tissue, Monocryl for the skin, Steri-Strips and sterile gauze. The wounds were injected. The patient was then awakened and returned to PACU in stable and satisfactory condition. There were no complications.  POST OPERATIVE PLAN: WBAT, DVT px: SCD's/TED and ASA 325  TEdmonia Lynch MD Orthopedic Surgeon [phone]  This note was generated using a template and dragon dictation system. In light of that, I have reviewed the note and all aspects of it are applicable to this case. Any dictation errors are due to the computerized dictation system.

## 2014-04-21 NOTE — Anesthesia Procedure Notes (Signed)
Spinal Patient location during procedure: OR Start time: 04/21/2014 8:17 AM End time: 04/21/2014 8:27 AM Staffing Anesthesiologist: Alexis Frock Performed by: anesthesiologist  Spinal Block Patient position: sitting Prep: Betadine Patient monitoring: continuous pulse ox, heart rate, cardiac monitor and blood pressure Location: L2-3 Injection technique: single-shot Needle Needle type: Pencil-Tip  Needle gauge: 24 G Needle length: 10 cm Assessment Sensory level: T10 Additional Notes Marcaine with dextrose 13mg 

## 2014-04-21 NOTE — OR Nursing (Signed)
Ky Barban as RNFA today.Marland Kitchen

## 2014-04-21 NOTE — Discharge Instructions (Signed)
Keep your dressings on and dry until follow up  [place] weight as tolerated

## 2014-04-21 NOTE — Progress Notes (Signed)
Utilization Review Completed.Santresa Levett T1/19/2016  

## 2014-04-21 NOTE — Interval H&P Note (Signed)
History and Physical Interval Note:  04/21/2014 7:43 AM  [name]  has presented today for surgery, with the diagnosis of OA RIGHT HIP  The various methods of treatment have been discussed with the patient and family. After consideration of risks, benefits and other options for treatment, the patient has consented to  Procedure(s): RIGHT TOTAL HIP ARTHROPLASTY ANTERIOR APPROACH (Right) as a surgical intervention .  The patient's history has been reviewed, patient examined, no change in status, stable for surgery.  I have reviewed the patient's chart and labs.  Questions were answered to the patient's satisfaction.     Veleria Barnhardt, D

## 2014-04-21 NOTE — Transfer of Care (Signed)
Immediate Anesthesia Transfer of Care Note  Patient: [name]  Procedure(s) Performed: Procedure(s): RIGHT TOTAL HIP ARTHROPLASTY ANTERIOR APPROACH (Right)  Patient Location: PACU  Anesthesia Type:Spinal  Level of Consciousness: awake and alert   Airway & Oxygen Therapy: Patient Spontanous Breathing and Patient connected to nasal cannula oxygen  Post-op Assessment: Report given to PACU RN and Post -op Vital signs reviewed and stable  Post vital signs: Reviewed and stable  Complications: No apparent anesthesia complications

## 2014-04-21 NOTE — Anesthesia Postprocedure Evaluation (Signed)
  Anesthesia Post-op Note  Patient: [name]  Procedure(s) Performed: Procedure(s): RIGHT TOTAL HIP ARTHROPLASTY ANTERIOR APPROACH (Right)  Patient Location: PACU  Anesthesia Type:General  Level of Consciousness: awake and alert   Airway and Oxygen Therapy: Patient Spontanous Breathing and Patient connected to nasal cannula oxygen  Post-op Pain: mild  Post-op Assessment: Post-op Vital signs reviewed, Patient's Cardiovascular Status Stable, Respiratory Function Stable, Patent Airway and No signs of Nausea or vomiting  Post-op Vital Signs: Reviewed and stable  Last Vitals:  Filed Vitals:   04/21/14 1155  BP: 147/66  Pulse: 96  Temp:   Resp: 13    Complications: No apparent anesthesia complications

## 2014-04-21 NOTE — Evaluation (Signed)
Physical Therapy Evaluation Patient Details Name: [name] MRN: [id number] DOB: 1955-07-18 Today's Date: 04/21/2014   History of Present Illness  59 y.o. female admitted to [facility] on 04/21/14 for elective R direct anterior THA.  Pt with significant PMHx of HTN, goiter, retinopathy, DM, and back surgery.    Clinical Impression  Pt is POD#0 and is moving well, overall min assist.  She will likely progress well enough to d/c home with family's assist and HHPT at discharge.   PT to follow acutely for deficits listed below.       Follow Up Recommendations Home health PT    Equipment Recommendations  Rolling walker with 5" wheels    Recommendations for Other Services   NA    Precautions / Restrictions Restrictions Weight Bearing Restrictions: Yes RLE Weight Bearing: Weight bearing as tolerated      Mobility  Bed Mobility Overal bed mobility: Needs Assistance Bed Mobility: Supine to Sit     Supine to sit: Min assist     General bed mobility comments: min assist to help progress right leg to EOB. Pt using bed rail for leverage to get to sitting.   Transfers Overall transfer level: Needs assistance Equipment used: Rolling walker (2 wheeled) Transfers: Sit to/from Stand Sit to Stand: Min assist         General transfer comment: Min assist to support trunk during transitions.  Verbal cues for safe hand placement.   Ambulation/Gait Ambulation/Gait assistance: Min assist Ambulation Distance (Feet): 20 Feet Assistive device: Rolling walker (2 wheeled) Gait Pattern/deviations: Step-to pattern;Antalgic     General Gait Details: Pt with normal post op antalgic gait pattern. Verbal cues for upright posture and correct LE sequencing.          Balance Overall balance assessment: Needs assistance Sitting-balance support: Feet supported;No upper extremity supported Sitting balance-Leahy Scale: Good     Standing balance support: No upper extremity supported;Bilateral  upper extremity supported;Single extremity supported Standing balance-Leahy Scale: Fair                               Pertinent Vitals/Pain Pain Assessment: 0-10 Pain Score: 3  Pain Location: right hip Pain Descriptors / Indicators: Aching Pain Intervention(s): Limited activity within patient's tolerance;Monitored during session;Repositioned    Home Living Family/patient expects to be discharged to:: Private residence Living Arrangements: Spouse/significant other Available Help at Discharge: Family;Available 24 hours/day Type of Home: House Home Access: Stairs to enter Entrance Stairs-Rails: None Entrance Stairs-Number of Steps: 2 (1, landing, 1) Home Layout: One level Home Equipment: Cane - single point      Prior Function Level of Independence: Independent         Comments: wants to recover qickly to be able to help her daughter with her triplets when they are born      Hand Dominance   Dominant Hand: Right    Extremity/Trunk Assessment   Upper Extremity Assessment: Overall WFL for tasks assessed           Lower Extremity Assessment: RLE deficits/detail RLE Deficits / Details: right leg with normal post op pain and weakness.  Pt with 4/5 ankle, 3/5 knee, 2+/5 hip    Cervical / Trunk Assessment: Other exceptions  Communication   Communication: No difficulties  Cognition Arousal/Alertness: Awake/alert Behavior During Therapy: WFL for tasks assessed/performed Overall Cognitive Status: Within Functional Limits for tasks assessed  Exercises Total Joint Exercises Ankle Circles/Pumps: AROM;Both;20 reps;Supine Quad Sets: AROM;Right;10 reps;Supine Heel Slides: AAROM;Right;10 reps;Supine      Assessment/Plan    PT Assessment Patient needs continued PT services  PT Diagnosis Abnormality of gait;Difficulty walking;Generalized weakness;Acute pain   PT Problem List Decreased strength;Decreased range of  motion;Decreased activity tolerance;Decreased balance;Decreased mobility;Decreased knowledge of use of DME;Pain  PT Treatment Interventions DME instruction;Gait training;Stair training;Functional mobility training;Therapeutic activities;Therapeutic exercise;Balance training;Neuromuscular re-education;Patient/family education;Manual techniques;Modalities   PT Goals (Current goals can be found in the Care Plan section) Acute Rehab PT Goals Patient Stated Goal: to get better so she can help take care of her daughter's triplets when they get here.  PT Goal Formulation: With patient Time For Goal Achievement: 04/28/14 Potential to Achieve Goals: Good    Frequency 7X/week           End of Session Equipment Utilized During Treatment: Gait belt Activity Tolerance: Patient tolerated treatment well Patient left: in chair;with call bell/phone within reach           Time: 1450-1525 PT Time Calculation (min) (ACUTE ONLY): 35 min   Charges:   PT Evaluation $Initial PT Evaluation Tier I: 1 Procedure PT Treatments $Gait Training: 8-22 mins        [name] B. [place], [place], DPT [phone]   04/21/2014, 4:11 PM

## 2014-04-22 ENCOUNTER — Encounter (HOSPITAL_COMMUNITY): Payer: Self-pay | Admitting: Orthopedic Surgery

## 2014-04-22 LAB — GLUCOSE, CAPILLARY
Glucose-Capillary: 169 mg/dL — ABNORMAL HIGH (ref 70–99)
Glucose-Capillary: 197 mg/dL — ABNORMAL HIGH (ref 70–99)

## 2014-04-22 NOTE — Plan of Care (Signed)
Problem: Consults Goal: Diagnosis- Total Joint Replacement Primary Total Hip Right     

## 2014-04-22 NOTE — Progress Notes (Signed)
Physical Therapy Treatment Patient Details Name: [name] MRN: [id number] DOB: [birth year] Today's Date: 04/22/2014    History of Present Illness 59 y.o. female admitted to [facility] on 04/21/14 for elective R direct anterior THA.  Pt with significant PMHx of HTN, goiter, retinopathy, DM, and back surgery.      PT Comments    Overall moving quite well; stair training complete; OK for dc home from PT standpoint   Follow Up Recommendations  Home health PT     Equipment Recommendations  Rolling walker with 5" wheels    Recommendations for Other Services       Precautions / Restrictions Precautions Precautions: Fall Restrictions Weight Bearing Restrictions: Yes RLE Weight Bearing: Weight bearing as tolerated    Mobility  Bed Mobility               General bed mobility comments: not assessed  Transfers Overall transfer level: Needs assistance Equipment used: Rolling walker (2 wheeled) Transfers: Sit to/from Stand Sit to Stand: Supervision         General transfer comment: cues to reinforce technique.  Ambulation/Gait Ambulation/Gait assistance: Supervision Ambulation Distance (Feet): 120 Feet Assistive device: Rolling walker (2 wheeled) Gait Pattern/deviations: Step-through pattern     General Gait Details: Continued cues for more normal gait pattern; managing quite well   Stairs Stairs: Yes Stairs assistance: Min guard Stair Management: No rails;Step to pattern;Forwards;Backwards;With walker Number of Stairs: 1 (x3) General stair comments: Cues for sequence and technique; simulated getting into their [facility] by going up on step backwards  Wheelchair Mobility    Modified Rankin (Stroke Patients Only)       Balance             Standing balance-Leahy Scale: Fair                      Cognition Arousal/Alertness: Awake/alert Behavior During Therapy: WFL for tasks assessed/performed Overall Cognitive Status: Within Functional Limits  for tasks assessed                      Exercises Total Joint Exercises Quad Sets: AROM;Right;10 reps;Supine Gluteal Sets: AROM;Both;10 reps Towel Squeeze: AROM;Both;10 reps    General Comments        Pertinent Vitals/Pain Pain Assessment: 0-10 Pain Score: 7  Pain Location: R hip Pain Descriptors / Indicators: Sore Pain Intervention(s): Repositioned    Home Living Family/patient expects to be discharged to:: Private residence Living Arrangements: Spouse/significant other Available Help at Discharge: Family;Available 24 hours/day Type of Home: House Home Access: Stairs to enter Entrance Stairs-Rails: None Home Layout: One level Home Equipment: Cane - single point      Prior Function Level of Independence: Independent      Comments: wants to recover qickly to be able to help her daughter with her triplets when they are born    PT Goals (current goals can now be found in the care plan section) Acute Rehab PT Goals Patient Stated Goal: to get better so she can help take care of her daughter's triplets when they get here.  PT Goal Formulation: With patient Time For Goal Achievement: 04/28/14 Potential to Achieve Goals: Good Progress towards PT goals: Progressing toward goals    Frequency  7X/week    PT Plan Current plan remains appropriate    Co-evaluation             End of Session Equipment Utilized During Treatment: Gait belt Activity Tolerance: Patient tolerated treatment  well Patient left: in chair;with call bell/phone within reach     Time: 0951-1010 PT Time Calculation (min) (ACUTE ONLY): 19 min  Charges:  $Gait Training: 8-22 mins                    G Codes:      [name] 04/22/2014, 12:13 PM  Roney Marion, [place] Pager [phone] Office [phone]

## 2014-04-22 NOTE — Progress Notes (Signed)
04/22/14 Set up with [facility] for HHPT by MD office.Spoke with patient, no change in d/c plan. Patient stated that she does not need a 3N1 but does need rolling walker. Contacted Frank with [employer] and requested rolling walker be delivered to patient's room.

## 2014-04-22 NOTE — Discharge Summary (Signed)
Physician Discharge Summary  Patient ID: [name] MRN: [id number] DOB/AGE: [birth year] 59 y.o.  Admit date: 04/21/2014 Discharge date: 04/22/2014  Admission Diagnoses:  <principal problem not specified>  Discharge Diagnoses:  Active Problems:   DJD (degenerative joint disease)   Past Medical History  Diagnosis Date  . PONV (postoperative nausea and vomiting)   . Hypertension   . Goiter   . Retinopathy   . Diabetes mellitus without complication     TYPE 2  . Arthritis     OSTEO    Surgeries: Procedure(s): RIGHT TOTAL HIP ARTHROPLASTY ANTERIOR APPROACH on 04/21/2014   Consultants (if any):    Discharged Condition: Improved  Hospital Course: [name] is an 59 y.o. female who was admitted 04/21/2014 with a diagnosis of <principal problem not specified> and went to the operating room on 04/21/2014 and underwent the above named procedures.    She was given perioperative antibiotics:  Anti-infectives    Start     Dose/Rate Route Frequency Ordered Stop   04/21/14 1400  ceFAZolin (ANCEF) IVPB 2 g/50 mL premix     2 g100 mL/hr over 30 Minutes Intravenous Every 6 hours 04/21/14 1238 04/21/14 2049   04/21/14 0600  ceFAZolin (ANCEF) IVPB 2 g/50 mL premix     2 g100 mL/hr over 30 Minutes Intravenous On call to O.R. 04/20/14 1324 04/21/14 0900    .  She was given sequential compression devices, early ambulation, and ASA 325 for DVT prophylaxis.  She benefited maximally from the hospital stay and there were no complications.    Recent vital signs:  Filed Vitals:   04/22/14 0515  BP: 91/47  Pulse: 94  Temp: 98.2 F (36.8 C)  Resp: 19    Recent laboratory studies:  Lab Results  Component Value Date   HGB 13.7 04/10/2014   Lab Results  Component Value Date   WBC 10.1 04/10/2014   PLT 341 04/10/2014   Lab Results  Component Value Date   INR 0.96 04/10/2014   Lab Results  Component Value Date   NA 138 04/10/2014   K 3.6 04/10/2014   CL 97  04/10/2014   CO2 27 04/10/2014   BUN 14 04/10/2014   CREATININE 0.80 04/10/2014   GLUCOSE 104* 04/10/2014    Discharge Medications:     Medication List    STOP taking these medications        acetaminophen 500 MG tablet  Commonly known as:  TYLENOL      TAKE these medications        aspirin EC 325 MG tablet  Take 1 tablet (325 mg total) by mouth daily.     docusate sodium 100 MG capsule  Commonly known as:  COLACE  Take 1 capsule (100 mg total) by mouth 2 (two) times daily. Continue this while taking narcotics to help with bowel movements     glipiZIDE 2.5 MG 24 hr tablet  Commonly known as:  GLUCOTROL XL  Take 2.5 mg by mouth 2 (two) times daily.     HYDROcodone-acetaminophen 5-325 MG per tablet  Commonly known as:  NORCO  Take 1-2 tablets by mouth every 4 (four) hours as needed for moderate pain.     lisinopril 40 MG tablet  Commonly known as:  PRINIVIL,ZESTRIL  Take 40 mg by mouth daily.     meloxicam 15 MG tablet  Commonly known as:  MOBIC  Take 1 tablet (15 mg total) by mouth daily.     ondansetron 4 MG  tablet  Commonly known as:  ZOFRAN  Take 1 tablet (4 mg total) by mouth every 8 (eight) hours as needed for nausea.     sitaGLIPtin-metformin 50-1000 MG per tablet  Commonly known as:  JANUMET  Take 1 tablet by mouth 2 (two) times daily with a meal.        Diagnostic Studies: Dg Pelvis Portable  04/21/2014   CLINICAL DATA:  Postop right total hip replacement.  EXAM: PORTABLE PELVIS 1-2 VIEWS  COMPARISON:  None.  FINDINGS: Examination demonstrates evidence of patient's recent right total hip arthroplasty with the prosthesis intact and normally located. Mild calcified plaque over the femoral arteries bilaterally.  IMPRESSION: Right total hip arthroplasty intact without complicating features.   Electronically Signed   By: Marin Olp M.D.   On: 04/21/2014 13:10    Disposition:         Follow-up Information    Follow up with Renette Butters, MD.    Specialty:  Orthopedic Surgery   Why:  as scheduled   Contact information:   [address] [place] [address] [phone]        Signed: Edmonia Lynch, D 04/22/2014, 8:11 AM

## 2014-04-22 NOTE — Evaluation (Signed)
Occupational Therapy Evaluation Patient Details Name: [name] MRN: [id number] DOB: 1955/10/24 Today's Date: 04/22/2014    History of Present Illness 59 y.o. female admitted to [facility] on 04/21/14 for elective R direct anterior THA.  Pt with significant PMHx of HTN, goiter, retinopathy, DM, and back surgery.     Clinical Impression   Pt s/p above. Education provided to pt. Pt moving well and feel pt is safe to d/c home, from OT standpoint, with spouse available to assist.     Follow Up Recommendations  No OT follow up;Supervision - Intermittent    Equipment Recommendations  None recommended by OT    Recommendations for Other Services       Precautions / Restrictions Precautions Precautions: Fall Restrictions Weight Bearing Restrictions: Yes RLE Weight Bearing: Weight bearing as tolerated      Mobility Bed Mobility               General bed mobility comments: not assessed  Transfers Overall transfer level: Needs assistance Equipment used: Rolling walker (2 wheeled) Transfers: Sit to/from Stand Sit to Stand: Supervision         General transfer comment: cues to reinforce technique.         ADL Overall ADL's : Needs assistance/impaired     Grooming: Wash/dry hands;Standing;Supervision/safety (would be supervision to gather items)       Lower Body Bathing: Supervison/ safety;Sit to/from stand       Lower Body Dressing: Supervision/safety;Sit to/from stand   Toilet Transfer: Supervision/safety;Ambulation;Comfort height toilet;RW   Toileting- Clothing Manipulation and Hygiene: Supervision/safety;Sit to/from stand   Tub/ Shower Transfer: Walk-in shower;Min guard;Ambulation;Rolling walker   Functional mobility during ADLs: Supervision/safety;Min guard;Rolling walker (Min guard for shower transfer)-cues for sequencing with ambulation General ADL Comments: Educated on LB dressing technique. Educated on shower and tub transfer techniques-pt plans to  use the walk in shower so practiced simulated shower transfer. Recommended someone be with her for shower transfer. Educated on safety such as sitting for LB ADLs, use of bag on walker, safe shoewear, rugs/items on floor. Discussed options for shower chair. Educated on use of reacher. Explained benefit of getting up and moving. Pt simulated LB bathing while standing, but unable to reach feet-states her husband can help-talked about using chair.     Vision  Retinopathy; pt wears glasses for reading at times                   Perception     Praxis      Pertinent Vitals/Pain Pain Assessment: 0-10 Pain Score: 4  Pain Location: Rt hip Pain Descriptors / Indicators: Aching;Sore Pain Intervention(s): Repositioned;Monitored during session     Hand Dominance Right   Extremity/Trunk Assessment Upper Extremity Assessment Upper Extremity Assessment: Overall WFL for tasks assessed   Lower Extremity Assessment Lower Extremity Assessment: Defer to PT evaluation       Communication Communication Communication: No difficulties   Cognition Arousal/Alertness: Awake/alert Behavior During Therapy: WFL for tasks assessed/performed Overall Cognitive Status: Within Functional Limits for tasks assessed                     General Comments       Exercises       Shoulder Instructions      Home Living Family/patient expects to be discharged to:: Private residence Living Arrangements: Spouse/significant other Available Help at Discharge: Family;Available 24 hours/day Type of Home: House Home Access: Stairs to enter [employer] of Steps: 2 (1, landing,  1) Entrance Stairs-Rails: None Home Layout: One level     Bathroom Shower/Tub: Tub only;Walk-in shower   Bathroom Toilet: Handicapped height     Home Equipment: Cane - single point          Prior Functioning/Environment Level of Independence: Independent        Comments: wants to recover qickly to be  able to help her daughter with her triplets when they are born     OT Diagnosis: Acute pain   OT Problem List:     OT Treatment/Interventions:      OT Goals(Current goals can be found in the care plan section)    OT Frequency:     Barriers to D/C:            Co-evaluation              End of Session Equipment Utilized During Treatment: Gait belt;Rolling walker  Activity Tolerance: Patient tolerated treatment well Patient left: in chair;with call bell/phone within reach   Time: 0926-0950 OT Time Calculation (min): 24 min Charges:  OT General Charges $OT Visit: 1 Procedure OT Evaluation $Initial OT Evaluation Tier I: 1 Procedure OT Treatments $Self Care/Home Management : 8-22 mins G-CodesBenito Mccreedy OTR/L [phone] 04/22/2014, 10:08 AM

## 2014-05-19 ENCOUNTER — Ambulatory Visit (HOSPITAL_COMMUNITY): Payer: 59 | Attending: Orthopedic Surgery | Admitting: Physical Therapy

## 2014-05-19 DIAGNOSIS — Z471 Aftercare following joint replacement surgery: Secondary | ICD-10-CM | POA: Insufficient documentation

## 2014-05-19 DIAGNOSIS — Z96641 Presence of right artificial hip joint: Secondary | ICD-10-CM | POA: Insufficient documentation

## 2014-05-22 ENCOUNTER — Ambulatory Visit (HOSPITAL_COMMUNITY): Payer: 59 | Admitting: Physical Therapy

## 2014-05-22 DIAGNOSIS — Z471 Aftercare following joint replacement surgery: Secondary | ICD-10-CM | POA: Diagnosis not present

## 2014-05-22 DIAGNOSIS — R29898 Other symptoms and signs involving the musculoskeletal system: Secondary | ICD-10-CM

## 2014-05-22 DIAGNOSIS — R262 Difficulty in walking, not elsewhere classified: Secondary | ICD-10-CM

## 2014-05-22 DIAGNOSIS — Z96641 Presence of right artificial hip joint: Secondary | ICD-10-CM | POA: Diagnosis not present

## 2014-05-22 DIAGNOSIS — R269 Unspecified abnormalities of gait and mobility: Secondary | ICD-10-CM

## 2014-05-22 NOTE — Therapy (Signed)
[place], [place], [address] Phone: [phone]   Fax:  [phone]  Physical Therapy Treatment  Patient Details  Name: [name] MRN: [id number] Date of Birth: May 22, 1955 Referring Provider:  Renette Butters, MD  Encounter Date: 05/22/2014      PT End of Session - 05/22/14 1854    Visit Number 1   Number of Visits 16   Date for PT Re-Evaluation 06/21/14   Authorization Type Medicare/Medicaid   Authorization - Visit Number 1   Authorization - Number of Visits 16   PT Start Time 1600   PT Stop Time 1645   PT Time Calculation (min) 45 min   Activity Tolerance Patient tolerated treatment well   Behavior During Therapy [facility] for tasks assessed/performed      Past Medical History  Diagnosis Date  . PONV (postoperative nausea and vomiting)   . Hypertension   . Goiter   . Retinopathy   . Diabetes mellitus without complication     TYPE 2  . Arthritis     OSTEO    Past Surgical History  Procedure Laterality Date  . Tonsillectomy    . Abdominal hysterectomy    . Cesarean section      x 2  . Eye surgery    . Back surgery    . Total hip arthroplasty Right 04/21/2014    dr Percell Miller  . Total hip arthroplasty Right 04/21/2014    Procedure: RIGHT TOTAL HIP ARTHROPLASTY ANTERIOR APPROACH;  Surgeon: Renette Butters, MD;  Location: [place];  Service: Orthopedics;  Laterality: Right;    There were no vitals taken for this visit.  Visit Diagnosis:  Weakness of right hip  Difficulty walking up stairs  Abnormality of gait      Subjective Assessment - 05/22/14 1615    Symptoms no pain just weakness.    Pertinent History Rt hip replacement 04/21/14, with HHPT for 3 weeks 3x a week. Patient had anterior approach, no precautions indicated. patient is a Psychiatric nurse.    How long can you walk comfortably? no difficulty just requires rest following prolonged actviity.    Patient Stated Goals strength and to be  able to walk normal.           [facility] PT Assessment - 05/22/14 0001    Assessment   Medical Diagnosis Rt TKA with anterior approach. difficulty walking and Rt hip weakness.    Onset Date 04/21/14   Next MD Visit [place] 06/03/14   Prior Therapy yes HHPT 3x 3 weeks.    Balance Screen   Has the patient fallen in the past 6 months No   Has the patient had a decrease in activity level because of a fear of falling?  No   Is the patient reluctant to leave their home because of a fear of falling?  No   Prior Function   Level of Independence Independent with basic ADLs   Observation/Other Assessments   Focus on Therapeutic Outcomes (FOTO)  58% limited.    Functional Tests   Functional tests Other;Other2   Other:   Other/ Comments Gait: limited toe in, excessive toe out, early heel rise   Other:   Other/Comments sit to stand 5x : 9.48 seconds from 20 inches unable to perform from 18"    AROM   Right Hip External Rotation  60   Right Hip Internal Rotation  40   Left Hip External Rotation  45  Left Hip Internal Rotation  20   Right Ankle Dorsiflexion -3   Left Ankle Dorsiflexion -2   Strength   Right Hip Flexion 2+/5   Right Hip Extension 4/5   Right Hip ABduction 2+/5   Left Hip Flexion 4/5   Left Hip Extension 4+/5   Left Hip ABduction 3+/5   Right Knee Flexion 3+/5   Right Knee Extension 4-/5   Left Knee Flexion 4/5   Left Knee Extension 5/5   Right Ankle Dorsiflexion 4/5   Left Ankle Dorsiflexion 4+/5           [facility] Adult PT Treatment/Exercise - 05/22/14 0001    Knee/Hip Exercises: Standing   Forward Lunges Limitations 10x to 6"   Functional Squat Limitations Neutral and split stance 5x each.            PT Education - 05/22/14 1652    Education provided Yes   Education Details Squats and anterior lunge   Person(s) Educated Patient   Methods Explanation;Demonstration;Handout   Comprehension Verbalized understanding;Returned demonstration          PT Short  Term Goals - 05/22/14 1858    PT SHORT TERM GOAL #1   Title Patient will be bale to perform sit to stand for a 18" surface withtou UE assistance.   Baseline requires UE assistance   Time 4   Period Weeks   Status New   PT SHORT TERM GOAL #2   Title Patient will be able to dmeosntrate increased bilateral ankle dorsiflexion of 5 degrees to increase stride length   Time 4   Period Weeks   Status New   PT SHORT TERM GOAL #3   Title Patient will demosntrate increased Lt hip internal toration to 40 degrees to improve deceleration mechanics.    Baseline 20 degrees   Time 4   Period Weeks   Status New   PT SHORT TERM GOAL #4   Title Patient will dmeonstrate increased hip flexion strength of 3+/5 to be able to ambulate up stairs   Time 4   Period Weeks   Status New   PT SHORT TERM GOAL #5   Title Patient will dmeosntrate increased Rt knee flexion strength of 4+/5 to be able to lunge on floor to pick up a bag of groceries.    Time 4   Period Weeks   Status New           PT Long Term Goals - 05/22/14 1901    PT LONG TERM GOAL #1   Title Patuient will demosntrate increased Rt hip abduction of 3+/5 MMT to be able to single leg stand indicating increased balance.   Baseline 2-/5   Time 8   Period Weeks   Status New   PT LONG TERM GOAL #2   Title Patient will dmeonstrate increased hip Extension of 4/5 MMT and be able to perform 5x sit to stand in 15 seconds indicating patient not at high risk of falls due to LE power.    Time 8   Period Weeks   Status New   PT LONG TERM GOAL #3   Title Patient will be able to dmeosntrate quadriceps and hamstring strength 5/5/ MMT to be able to ambulate up and down stairs without UE assistance.    Time 8   Period Weeks   Status New   PT LONG TERM GOAL #4   Title Patient will be independent with HEP.   Time 8   Period Weeks  Status New           Plan - 2014/05/28 1855    Clinical Impression Statement Patient displasy Rt LE weakness s/p Rt  THA resultign in difficulty walking up and down stairs, squatting to lift from the floor, and subsequent stiffness in Lt hip due to disuse. Patient will benefit from skilled physica therapy to returnt o prior level of fucntion including standing from a chair without UE assistance.    Pt will benefit from skilled therapeutic intervention in order to improve on the following deficits Abnormal gait;Decreased strength;Difficulty walking;Decreased activity tolerance;Decreased range of motion;Improper body mechanics   Rehab Potential Good   PT Frequency 2x / week   PT Duration 8 weeks   PT Treatment/Interventions Therapeutic exercise;Gait training;Therapeutic activities;Balance training   PT Next Visit Plan Introduce standign LE strengtheing exercises with focus on glutes, hamstrings and increasing flexibility of calfs and Lt hip internal rotation.    PT Home Exercise Plan squats and lunges   Consulted and Agree with Plan of Care Patient          G-Codes - 05/28/14 1904    Functional Assessment Tool Used FOTO 58% limited   Functional Limitation Mobility: Walking and moving around   Mobility: Walking and Moving Around Current Status [id number]) At least 40 percent but less than 60 percent impaired, limited or restricted   Mobility: Walking and Moving Around Goal Status [phone]) At least 20 percent but less than 40 percent impaired, limited or restricted      Problem List Patient Active Problem List   Diagnosis Date Noted  . DJD (degenerative joint disease) 04/21/2014    Devona Konig PT DPT [place], [place], [address] Phone: [phone]   Fax:  [phone]

## 2014-05-25 ENCOUNTER — Ambulatory Visit (HOSPITAL_COMMUNITY): Payer: 59 | Admitting: Physical Therapy

## 2014-05-25 ENCOUNTER — Other Ambulatory Visit: Payer: Self-pay | Admitting: Ophthalmology

## 2014-05-25 ENCOUNTER — Encounter (HOSPITAL_COMMUNITY): Payer: Self-pay | Admitting: *Deleted

## 2014-05-25 DIAGNOSIS — R262 Difficulty in walking, not elsewhere classified: Secondary | ICD-10-CM

## 2014-05-25 DIAGNOSIS — R29898 Other symptoms and signs involving the musculoskeletal system: Secondary | ICD-10-CM

## 2014-05-25 DIAGNOSIS — R269 Unspecified abnormalities of gait and mobility: Secondary | ICD-10-CM

## 2014-05-25 DIAGNOSIS — Z471 Aftercare following joint replacement surgery: Secondary | ICD-10-CM | POA: Diagnosis not present

## 2014-05-25 NOTE — Progress Notes (Signed)
Mrs Mak's PCP is Delphina Cahill , he manages her diabetes.  Patient reports last hgbA1C was 6.8 drawn in Dec. 2015, fasting CBG this am was 68.  "CBG has been low some lately, can I take Glucose tabs if CBG is low".  I instructed to take Glucose tabs if needed and to report it to  RN in am.

## 2014-05-25 NOTE — Therapy (Signed)
[place], [place], [address] Phone: [phone]   Fax:  [phone]  Physical Therapy Treatment  Patient Details  Name: [name] MRN: [id number] Date of Birth: [birth year] Referring Provider:  Renette Butters, MD  Encounter Date: 05/25/2014      PT End of Session - 05/25/14 1621    Visit Number 2   Number of Visits 16   Date for PT Re-Evaluation 06/21/14   Authorization Type Medicare/Medicaid   Authorization - Visit Number 2   Authorization - Number of Visits 16   PT Start Time 1430   PT Stop Time 1512   PT Time Calculation (min) 42 min   Activity Tolerance Patient tolerated treatment well   Behavior During Therapy [facility] for tasks assessed/performed      Past Medical History  Diagnosis Date  . PONV (postoperative nausea and vomiting)   . Hypertension   . Goiter   . Retinopathy   . Diabetes mellitus without complication     TYPE 2  . Arthritis     OSTEO    Past Surgical History  Procedure Laterality Date  . Tonsillectomy    . Abdominal hysterectomy    . Cesarean section      x 2  . Eye surgery    . Back surgery    . Total hip arthroplasty Right 04/21/2014    dr Percell Miller  . Total hip arthroplasty Right 04/21/2014    Procedure: RIGHT TOTAL HIP ARTHROPLASTY ANTERIOR APPROACH;  Surgeon: Renette Butters, MD;  Location: [place];  Service: Orthopedics;  Laterality: Right;    There were no vitals taken for this visit.  Visit Diagnosis:  Weakness of right hip  Difficulty walking up stairs  Abnormality of gait      Subjective Assessment - 05/25/14 1431    Symptoms No hip pain but patient does c/o general muscle soreness from walking around the store for a long time the other day   Pertinent History Rt hip replacement 04/21/14, with HHPT for 3 weeks 3x a week. Patient had anterior approach, no precautions indicated. patient is a Secondary school teacher Adult PT  Treatment/Exercise - 05/25/14 0001    Knee/Hip Exercises: Stretches   Active Hamstring Stretch 3 reps;30 seconds   Active Hamstring Stretch Limitations 14" box   Gastroc Stretch 3 reps;30 seconds   Gastroc Stretch Limitations slantboard   Knee/Hip Exercises: Standing   Heel Raises 15 reps   Forward Lunges 10 reps;1 set;Right   Forward Lunges Limitations 6 inch box   Side Lunges Both;1 set;10 reps   Side Lunges Limitations difficulty lunging to R due to weakness, no pain during activity   Functional Squat 1 set;10 reps   Functional Squat Limitations Sit to stand from low surface   Rocker Board 4 minutes   Other Standing Knee Exercises Sit to stand with overhead reach with green ball and bilateral ankle PF 1x10   Knee/Hip Exercises: Seated   Long Arc Quad Both;1 set;10 reps   Long Arc Quad Weight 0 lbs.   Knee/Hip Exercises: Supine   Bridges Both;2 sets;10 reps   Bridges Limitations To neutral   Straight Leg Raises AROM;Both;1 set;10 reps   Knee/Hip Exercises: Sidelying   Hip ABduction 1 set;10 reps;Both   Hip ABduction Limitations manual facilitation for proper form  PT Short Term Goals - 05/22/14 1858    PT SHORT TERM GOAL #1   Title Patient will be bale to perform sit to stand for a 18" surface withtou UE assistance.   Baseline requires UE assistance   Time 4   Period Weeks   Status New   PT SHORT TERM GOAL #2   Title Patient will be able to dmeosntrate increased bilateral ankle dorsiflexion of 5 degrees to increase stride length   Time 4   Period Weeks   Status New   PT SHORT TERM GOAL #3   Title Patient will demosntrate increased Lt hip internal toration to 40 degrees to improve deceleration mechanics.    Baseline 20 degrees   Time 4   Period Weeks   Status New   PT SHORT TERM GOAL #4   Title Patient will dmeonstrate increased hip flexion strength of 3+/5 to be able to ambulate up stairs   Time 4   Period Weeks   Status New   PT SHORT  TERM GOAL #5   Title Patient will dmeosntrate increased Rt knee flexion strength of 4+/5 to be able to lunge on floor to pick up a bag of groceries.    Time 4   Period Weeks   Status New           PT Long Term Goals - 05/22/14 1901    PT LONG TERM GOAL #1   Title Patuient will demosntrate increased Rt hip abduction of 3+/5 MMT to be able to single leg stand indicating increased balance.   Baseline 2-/5   Time 8   Period Weeks   Status New   PT LONG TERM GOAL #2   Title Patient will dmeonstrate increased hip Extension of 4/5 MMT and be able to perform 5x sit to stand in 15 seconds indicating patient not at high risk of falls due to LE power.    Time 8   Period Weeks   Status New   PT LONG TERM GOAL #3   Title Patient will be able to dmeosntrate quadriceps and hamstring strength 5/5/ MMT to be able to ambulate up and down stairs without UE assistance.    Time 8   Period Weeks   Status New   PT LONG TERM GOAL #4   Title Patient will be independent with HEP.   Time 8   Period Weeks   Status New               Plan - 05/25/14 1621    Clinical Impression Statement Patient demonstrates general weakness bilateral lower extremities especially with extended activity. Patient did show muscle fatigue quickly with supine and sidelying exercises on mat table, and did also require cues to prevent R hip from reaching an increased level of extension at this time in order to maintain general universal antterior hip precautions. Training regarding mobilty to prevent R hip from falling into extensive extension to protect hip. Impaired proprioception and thereby impaired balance and balance recovery skills noted.    Pt will benefit from skilled therapeutic intervention in order to improve on the following deficits Abnormal gait;Decreased strength;Difficulty walking;Decreased activity tolerance;Decreased range of motion;Improper body mechanics   Rehab Potential Good   PT Frequency 2x / week    PT Duration 8 weeks   PT Treatment/Interventions Therapeutic exercise;Gait training;Therapeutic activities;Balance training   PT Next Visit Plan Continue functional strengthening and functional stretches; continue to guide patient towards mobility strategies that will prevent excessive R hip  extension and protect hip; balance work and SLS   Consulted and Agree with Plan of Care Patient        Problem List Patient Active Problem List   Diagnosis Date Noted  . DJD (degenerative joint disease) 04/21/2014    Deniece Ree PT, DPT [facility] [address] [place], [place], [address] Phone: [phone]   Fax:  [phone]

## 2014-05-26 ENCOUNTER — Ambulatory Visit (HOSPITAL_COMMUNITY): Payer: 59 | Admitting: Anesthesiology

## 2014-05-26 ENCOUNTER — Encounter (HOSPITAL_COMMUNITY)
Admission: RE | Disposition: A | Discharge status: Home / Self Care | Payer: Self-pay | Source: Ambulatory Visit | Attending: Ophthalmology

## 2014-05-26 ENCOUNTER — Encounter (HOSPITAL_COMMUNITY): Payer: Self-pay | Admitting: *Deleted

## 2014-05-26 ENCOUNTER — Ambulatory Visit (HOSPITAL_COMMUNITY)
Admission: RE | Admit: 2014-05-26 | Discharge: 2014-05-26 | Disposition: A | Discharge status: Home / Self Care | Payer: 59 | Source: Ambulatory Visit | Attending: Ophthalmology | Admitting: Ophthalmology

## 2014-05-26 DIAGNOSIS — H4311 Vitreous hemorrhage, right eye: Secondary | ICD-10-CM | POA: Insufficient documentation

## 2014-05-26 DIAGNOSIS — E119 Type 2 diabetes mellitus without complications: Secondary | ICD-10-CM | POA: Insufficient documentation

## 2014-05-26 DIAGNOSIS — Z886 Allergy status to analgesic agent status: Secondary | ICD-10-CM | POA: Insufficient documentation

## 2014-05-26 DIAGNOSIS — I10 Essential (primary) hypertension: Secondary | ICD-10-CM | POA: Diagnosis not present

## 2014-05-26 DIAGNOSIS — E049 Nontoxic goiter, unspecified: Secondary | ICD-10-CM | POA: Diagnosis not present

## 2014-05-26 DIAGNOSIS — M1990 Unspecified osteoarthritis, unspecified site: Secondary | ICD-10-CM | POA: Insufficient documentation

## 2014-05-26 DIAGNOSIS — Z96641 Presence of right artificial hip joint: Secondary | ICD-10-CM | POA: Diagnosis not present

## 2014-05-26 DIAGNOSIS — E11359 Type 2 diabetes mellitus with proliferative diabetic retinopathy without macular edema: Secondary | ICD-10-CM | POA: Diagnosis not present

## 2014-05-26 HISTORY — DX: Family history of other specified conditions: Z84.89

## 2014-05-26 HISTORY — PX: PARS PLANA VITRECTOMY: SHX2166

## 2014-05-26 HISTORY — PX: LASER PHOTO ABLATION: SHX5942

## 2014-05-26 HISTORY — PX: INJECTION OF SILICONE OIL: SHX6422

## 2014-05-26 LAB — CBC
HCT: 33 % — ABNORMAL LOW (ref 36.0–46.0)
Hemoglobin: 10.4 g/dL — ABNORMAL LOW (ref 12.0–15.0)
MCH: 28.7 pg (ref 26.0–34.0)
MCHC: 31.5 g/dL (ref 30.0–36.0)
MCV: 91.2 fL (ref 78.0–100.0)
PLATELETS: 277 10*3/uL (ref 150–400)
RBC: 3.62 MIL/uL — ABNORMAL LOW (ref 3.87–5.11)
RDW: 14.6 % (ref 11.5–15.5)
WBC: 6.6 10*3/uL (ref 4.0–10.5)

## 2014-05-26 LAB — BASIC METABOLIC PANEL
ANION GAP: 6 (ref 5–15)
BUN: 24 mg/dL — ABNORMAL HIGH (ref 6–23)
CHLORIDE: 107 mmol/L (ref 96–112)
CO2: 26 mmol/L (ref 19–32)
CREATININE: 0.69 mg/dL (ref 0.50–1.10)
Calcium: 9.5 mg/dL (ref 8.4–10.5)
GFR calc non Af Amer: >90 mL/min (ref >90)
Glucose, Bld: 123 mg/dL — ABNORMAL HIGH (ref 70–99)
Potassium: 4.1 mmol/L (ref 3.5–5.1)
SODIUM: 139 mmol/L (ref 135–145)

## 2014-05-26 LAB — GLUCOSE, CAPILLARY
GLUCOSE-CAPILLARY: 115 mg/dL — AB (ref 70–99)
Glucose-Capillary: 100 mg/dL — ABNORMAL HIGH (ref 70–99)

## 2014-05-26 SURGERY — PARS PLANA VITRECTOMY 25 GAUGE FOR ENDOPHTHALMITIS
Anesthesia: Monitor Anesthesia Care | Site: Eye | Laterality: Right

## 2014-05-26 MED ORDER — BSS PLUS IO SOLN
INTRAOCULAR | Status: AC
  Filled 2014-05-26: qty 500

## 2014-05-26 MED ORDER — CEFTAZIDIME INTRAVITREAL INJECTION 2.25 MG/0.1 ML
2.2500 mg | INTRAVITREAL | Status: DC
  Filled 2014-05-26: qty 0.1

## 2014-05-26 MED ORDER — CYCLOPENTOLATE HCL 1 % OP SOLN
OPHTHALMIC | Status: AC
  Administered 2014-05-26: 1 [drp] via OPHTHALMIC
  Filled 2014-05-26: qty 2

## 2014-05-26 MED ORDER — GATIFLOXACIN 0.5 % OP SOLN
1.0000 [drp] | OPHTHALMIC | Status: AC | PRN
  Administered 2014-05-26 (×3): 1 [drp] via OPHTHALMIC

## 2014-05-26 MED ORDER — SODIUM CHLORIDE 0.9 % IJ SOLN
INTRAMUSCULAR | Status: AC
  Filled 2014-05-26: qty 10

## 2014-05-26 MED ORDER — POLYMYXIN B SULFATE 500000 UNITS IJ SOLR
INTRAMUSCULAR | Status: AC
  Filled 2014-05-26: qty 1

## 2014-05-26 MED ORDER — FENTANYL CITRATE 0.05 MG/ML IJ SOLN
INTRAMUSCULAR | Status: AC
  Filled 2014-05-26: qty 5

## 2014-05-26 MED ORDER — HYPROMELLOSE (GONIOSCOPIC) 2.5 % OP SOLN
OPHTHALMIC | Status: DC | PRN
  Administered 2014-05-26: 1 [drp] via OPHTHALMIC

## 2014-05-26 MED ORDER — LIDOCAINE HCL 2 % IJ SOLN
INTRAMUSCULAR | Status: AC
  Filled 2014-05-26: qty 20

## 2014-05-26 MED ORDER — HYPROMELLOSE (GONIOSCOPIC) 2.5 % OP SOLN
OPHTHALMIC | Status: AC
  Filled 2014-05-26: qty 15

## 2014-05-26 MED ORDER — LIDOCAINE HCL (CARDIAC) 20 MG/ML IV SOLN
INTRAVENOUS | Status: AC
  Filled 2014-05-26: qty 5

## 2014-05-26 MED ORDER — SODIUM HYALURONATE 10 MG/ML IO SOLN
INTRAOCULAR | Status: AC
  Filled 2014-05-26: qty 0.85

## 2014-05-26 MED ORDER — DEXAMETHASONE SODIUM PHOSPHATE 10 MG/ML IJ SOLN
INTRAMUSCULAR | Status: AC
  Filled 2014-05-26: qty 1

## 2014-05-26 MED ORDER — VANCOMYCIN INTRAVITREAL INJECTION 1 MG/0.1 ML
1.0000 mg | INTRAOCULAR | Status: DC
  Filled 2014-05-26: qty 0.1

## 2014-05-26 MED ORDER — MIDAZOLAM HCL 5 MG/5ML IJ SOLN
INTRAMUSCULAR | Status: DC | PRN
  Administered 2014-05-26: 2 mg via INTRAVENOUS

## 2014-05-26 MED ORDER — CYCLOPENTOLATE HCL 1 % OP SOLN
1.0000 [drp] | OPHTHALMIC | Status: AC | PRN
  Administered 2014-05-26 (×3): 1 [drp] via OPHTHALMIC

## 2014-05-26 MED ORDER — VANCOMYCIN HCL IN DEXTROSE 1-5 GM/200ML-% IV SOLN
1000.0000 mg | INTRAVENOUS | Status: DC
  Filled 2014-05-26: qty 200

## 2014-05-26 MED ORDER — GATIFLOXACIN 0.5 % OP SOLN
OPHTHALMIC | Status: AC
  Administered 2014-05-26: 1 [drp] via OPHTHALMIC
  Filled 2014-05-26: qty 2.5

## 2014-05-26 MED ORDER — TETRACAINE HCL 0.5 % OP SOLN
OPHTHALMIC | Status: AC
  Filled 2014-05-26: qty 2

## 2014-05-26 MED ORDER — ROCURONIUM BROMIDE 50 MG/5ML IV SOLN
INTRAVENOUS | Status: AC
  Filled 2014-05-26: qty 1

## 2014-05-26 MED ORDER — PHENYLEPHRINE HCL 2.5 % OP SOLN
OPHTHALMIC | Status: AC
  Administered 2014-05-26: 1 [drp] via OPHTHALMIC
  Filled 2014-05-26: qty 2

## 2014-05-26 MED ORDER — EPINEPHRINE HCL 1 MG/ML IJ SOLN
INTRAOCULAR | Status: DC | PRN
  Administered 2014-05-26: 500 mL

## 2014-05-26 MED ORDER — SUCCINYLCHOLINE CHLORIDE 20 MG/ML IJ SOLN
INTRAMUSCULAR | Status: AC
  Filled 2014-05-26: qty 1

## 2014-05-26 MED ORDER — LACTATED RINGERS IV SOLN
INTRAVENOUS | Status: DC | PRN
  Administered 2014-05-26: 07:00:00 via INTRAVENOUS

## 2014-05-26 MED ORDER — DEXAMETHASONE SODIUM PHOSPHATE 10 MG/ML IJ SOLN
INTRAMUSCULAR | Status: DC | PRN
  Administered 2014-05-26: 10 mg

## 2014-05-26 MED ORDER — PHENYLEPHRINE HCL 2.5 % OP SOLN
1.0000 [drp] | OPHTHALMIC | Status: AC | PRN
  Administered 2014-05-26 (×3): 1 [drp] via OPHTHALMIC

## 2014-05-26 MED ORDER — EPHEDRINE SULFATE 50 MG/ML IJ SOLN
INTRAMUSCULAR | Status: AC
  Filled 2014-05-26: qty 1

## 2014-05-26 MED ORDER — PROPOFOL 10 MG/ML IV BOLUS
INTRAVENOUS | Status: DC | PRN
  Administered 2014-05-26: 40 mg via INTRAVENOUS

## 2014-05-26 MED ORDER — FENTANYL CITRATE 0.05 MG/ML IJ SOLN
INTRAMUSCULAR | Status: DC | PRN
  Administered 2014-05-26: 50 ug via INTRAVENOUS

## 2014-05-26 MED ORDER — MIDAZOLAM HCL 2 MG/2ML IJ SOLN
INTRAMUSCULAR | Status: AC
  Filled 2014-05-26: qty 2

## 2014-05-26 MED ORDER — LIDOCAINE HCL 2 % IJ SOLN
INTRAMUSCULAR | Status: DC | PRN
  Administered 2014-05-26: 10 mL

## 2014-05-26 MED ORDER — BSS IO SOLN
INTRAOCULAR | Status: AC
  Filled 2014-05-26: qty 15

## 2014-05-26 MED ORDER — NA CHONDROIT SULF-NA HYALURON 40-30 MG/ML IO SOLN
INTRAOCULAR | Status: AC
  Filled 2014-05-26: qty 0.5

## 2014-05-26 MED ORDER — PROPOFOL 10 MG/ML IV BOLUS
INTRAVENOUS | Status: AC
  Filled 2014-05-26: qty 20

## 2014-05-26 MED ORDER — DEXTROSE 5 % IV SOLN
1.0000 g | INTRAVENOUS | Status: DC
  Filled 2014-05-26: qty 1

## 2014-05-26 MED ORDER — GENTAMICIN SULFATE 40 MG/ML IJ SOLN
INTRAMUSCULAR | Status: AC
  Filled 2014-05-26: qty 2

## 2014-05-26 MED ORDER — EPINEPHRINE HCL 1 MG/ML IJ SOLN
INTRAMUSCULAR | Status: AC
  Filled 2014-05-26: qty 1

## 2014-05-26 SURGICAL SUPPLY — 32 items
APPLICATOR COTTON TIP 6IN STRL (MISCELLANEOUS) ×3 IMPLANT
BLADE MVR KNIFE 20G (BLADE) ×3 IMPLANT
CANNULA TROCAR 25GA VLV (OPHTHALMIC) ×3 IMPLANT
CANNULA VLV SOFT TIP 25GA (OPHTHALMIC) ×3 IMPLANT
COVER MAYO STAND STRL (DRAPES) ×3 IMPLANT
DRAPE OPHTHALMIC 77X100 STRL (CUSTOM PROCEDURE TRAY) ×3 IMPLANT
FILTER STRAW FLUID ASPIR (MISCELLANEOUS) IMPLANT
FORCEPS ECKARDT ILM 25G SERR (OPHTHALMIC RELATED) ×3 IMPLANT
GLOVE BIO SURGEON STRL SZ7 (GLOVE) ×3 IMPLANT
GLOVE SS BIOGEL STRL SZ 8.5 (GLOVE) ×1 IMPLANT
GLOVE SUPERSENSE BIOGEL SZ 8.5 (GLOVE) ×2
GLOVE SURG SS PI 7.0 STRL IVOR (GLOVE) ×3 IMPLANT
GOWN STRL REUS W/ TWL LRG LVL3 (GOWN DISPOSABLE) ×2 IMPLANT
GOWN STRL REUS W/TWL LRG LVL3 (GOWN DISPOSABLE) ×4
KIT BASIN OR (CUSTOM PROCEDURE TRAY) ×3 IMPLANT
KIT ROOM TURNOVER OR (KITS) ×3 IMPLANT
LENS BIOM SUPER VIEW SET DISP (OPHTHALMIC RELATED) ×3 IMPLANT
NEEDLE 18GX1X1/2 (RX/OR ONLY) (NEEDLE) ×3 IMPLANT
NEEDLE 25GX 5/8IN NON SAFETY (NEEDLE) ×3 IMPLANT
NEEDLE HYPO 30X.5 LL (NEEDLE) ×6 IMPLANT
OIL SILICONE OPHTHALMIC 1000 (Ophthalmic Related) ×3 IMPLANT
PACK VITRECTOMY CUSTOM (CUSTOM PROCEDURE TRAY) ×3 IMPLANT
PAD ARMBOARD 7.5X6 YLW CONV (MISCELLANEOUS) ×6 IMPLANT
PAK PIK VITRECTOMY CVS 25GA (OPHTHALMIC) ×3 IMPLANT
PENCIL BIPOLAR 25GA STR DISP (OPHTHALMIC RELATED) IMPLANT
PROBE LASER ILLUM FLEX CVD 25G (OPHTHALMIC) ×3 IMPLANT
SET INJECTOR OIL FLUID CONSTEL (OPHTHALMIC) ×3 IMPLANT
STOCKINETTE IMPERVIOUS 9X36 MD (GAUZE/BANDAGES/DRESSINGS) ×6 IMPLANT
STOPCOCK 4 WAY LG BORE MALE ST (IV SETS) IMPLANT
SUT MERSILENE 5 0 RD 1 DA (SUTURE) IMPLANT
SYR TB 1ML LUER SLIP (SYRINGE) ×6 IMPLANT
TOWEL OR 17X24 6PK STRL BLUE (TOWEL DISPOSABLE) ×6 IMPLANT

## 2014-05-26 NOTE — Anesthesia Preprocedure Evaluation (Addendum)
Anesthesia Evaluation  Patient identified by MRN, date of birth, ID band Patient awake    Reviewed: Allergy & Precautions, NPO status , Patient's Chart, lab work & pertinent test results  History of Anesthesia Complications (+) PONV and Family history of anesthesia reactionHistory of anesthetic complications: family hx of PONV. Personal hx of N&V after IV injection of lidocaine for GA. Has had spinal anesthesia and retrobulbar blocks in the past with no issues.  Airway Mallampati: II  TM Distance: >3 FB Neck ROM: Full   Comment: Goiter present. Pt reports no difficulty with breathing or swallowing.  Dental  (+) Teeth Intact, Dental Advisory Given   Pulmonary          Cardiovascular hypertension, Pt. on medications     Neuro/Psych    GI/Hepatic   Endo/Other  diabetes, Well Controlled, Type 2, Oral Hypoglycemic AgentsGoiter present  Renal/GU      Musculoskeletal  (+) Arthritis -, Osteoarthritis,    Abdominal   Peds  Hematology   Anesthesia Other Findings   Reproductive/Obstetrics                           Anesthesia Physical Anesthesia Plan  ASA: III  Anesthesia Plan: MAC   Post-op Pain Management:    Induction:   Airway Management Planned: Nasal Cannula  Additional Equipment:   Intra-op Plan:   Post-operative Plan:   Informed Consent: I have reviewed the patients History and Physical, chart, labs and discussed the procedure including the risks, benefits and alternatives for the proposed anesthesia with the patient or authorized representative who has indicated his/her understanding and acceptance.   Dental advisory given  Plan Discussed with: CRNA and Anesthesiologist  Anesthesia Plan Comments:         Anesthesia Quick Evaluation

## 2014-05-26 NOTE — Transfer of Care (Signed)
Immediate Anesthesia Transfer of Care Note  Patient: [name]  Procedure(s) Performed: Procedure(s): PARS PLANA VITRECTOMY 25 GAUGE (Right) INJECTION OF SILICONE OIL (Right) LASER PHOTO ABLATION (Right)  Patient Location: PACU  Anesthesia Type:MAC  Level of Consciousness: awake, alert  and oriented  Airway & Oxygen Therapy: Patient Spontanous Breathing  Post-op Assessment: Report given to RN, Post -op Vital signs reviewed and stable and Patient moving all extremities  Post vital signs: Reviewed and stable  Last Vitals:  Filed Vitals:   05/26/14 0851  BP:   Pulse: 95  Temp:   Resp: 14    Complications: No apparent anesthesia complications

## 2014-05-26 NOTE — H&P (Signed)
[name] is an 59 y.o. female.   Chief Complaint: Vision loss right eye, nonclearing vitreous hemorrhage from recurrent proliferative diabetic retinopathy HPI: 6 months of nonclearing vitreous hemorrhage right eye profound vision loss with other systemic illnesses. She suffers with hand motion vision. There is no retinal detachment noted on preoperative B-scan ultrasonography of the right eye.  Past Medical History  Diagnosis Date  . PONV (postoperative nausea and vomiting)   . Hypertension   . Goiter   . Retinopathy   . Diabetes mellitus without complication     TYPE 2  . Arthritis     OSTEO  . Family history of adverse reaction to anesthesia     Mother - N/V    Past Surgical History  Procedure Laterality Date  . Tonsillectomy    . Abdominal hysterectomy    . Cesarean section      x 2  . Eye surgery    . Back surgery    . Total hip arthroplasty Right 04/21/2014    dr Percell Miller  . Total hip arthroplasty Right 04/21/2014    Procedure: RIGHT TOTAL HIP ARTHROPLASTY ANTERIOR APPROACH;  Surgeon: Renette Butters, MD;  Location: [facility];  Service: Orthopedics;  Laterality: Right;    History reviewed. No pertinent family history. Social History:  reports that she has never smoked. She has never used smokeless tobacco. She reports that she does not drink alcohol or use illicit drugs.  Allergies:  Allergies  Allergen Reactions  . Anesthetics, Amide Nausea And Vomiting  . Codeine Nausea And Vomiting    Medications Prior to Admission  Medication Sig Dispense Refill  . acetaminophen (TYLENOL) 500 MG tablet Take 1,000 mg by mouth every 6 (six) hours as needed.    Marland Kitchen glipiZIDE (GLUCOTROL XL) 2.5 MG 24 hr tablet Take 2.5 mg by mouth 2 (two) times daily.    Marland Kitchen lisinopril (PRINIVIL,ZESTRIL) 40 MG tablet Take 40 mg by mouth daily.    . Multiple Vitamins-Minerals (MULTIVITAMIN PO) Take 1 tablet by mouth daily.    . Multiple Vitamins-Minerals (OCUVITE ADULT 50+ PO) Take 1 tablet by mouth  daily.    . Omega-3 Fatty Acids (FISH OIL PO) Take 1 tablet by mouth daily.    . sitaGLIPtin-metformin (JANUMET) 50-1000 MG per tablet Take 1 tablet by mouth 2 (two) times daily with a meal.    . UNKNOWN TO PATIENT 2 eye drop prescribed by Dr. Zadie Rhine to be used prior to surgery    . aspirin EC 325 MG tablet Take 1 tablet (325 mg total) by mouth daily. (Patient not taking: Reported on 05/25/2014) 30 tablet 0  . docusate sodium (COLACE) 100 MG capsule Take 1 capsule (100 mg total) by mouth 2 (two) times daily. Continue this while taking narcotics to help with bowel movements (Patient not taking: Reported on 05/25/2014) 30 capsule 1  . glucose 4 GM chewable tablet Chew 1 tablet by mouth as needed for low blood sugar.    . HYDROcodone-acetaminophen (NORCO) 5-325 MG per tablet Take 1-2 tablets by mouth every 4 (four) hours as needed for moderate pain. (Patient not taking: Reported on 05/25/2014) 90 tablet 0  . meloxicam (MOBIC) 15 MG tablet Take 1 tablet (15 mg total) by mouth daily. (Patient not taking: Reported on 05/25/2014) 30 tablet 0  . ondansetron (ZOFRAN) 4 MG tablet Take 1 tablet (4 mg total) by mouth every 8 (eight) hours as needed for nausea. (Patient not taking: Reported on 05/25/2014) 60 tablet 0  Results for orders placed or performed during the hospital encounter of 05/26/14 (from the past 48 hour(s))  Glucose, capillary     Status: Abnormal   Collection Time: 05/26/14  6:04 AM  Result Value Ref Range   Glucose-Capillary 115 (H) 70 - 99 mg/dL  CBC     Status: Abnormal   Collection Time: 05/26/14  6:36 AM  Result Value Ref Range   WBC 6.6 4.0 - 10.5 K/uL   RBC 3.62 (L) 3.87 - 5.11 MIL/uL   Hemoglobin 10.4 (L) 12.0 - 15.0 g/dL   HCT 33.0 (L) 36.0 - 46.0 %   MCV 91.2 78.0 - 100.0 fL   MCH 28.7 26.0 - 34.0 pg   MCHC 31.5 30.0 - 36.0 g/dL   RDW 14.6 11.5 - 15.5 %   Platelets 277 150 - 400 K/uL   No results found.  Review of Systems  Constitutional: Negative.   HENT: Negative.     Eyes: Positive for blurred vision.  Respiratory: Negative.   Cardiovascular: Negative.   Gastrointestinal: Negative.   Musculoskeletal: Negative.   Skin: Negative.   Neurological: Negative.   Psychiatric/Behavioral: Negative.     Blood pressure 129/69, pulse 94, temperature 98.1 F (36.7 C), temperature source Oral, resp. rate 18, height 5\' 5"  (1.651 m), weight 76.204 kg (168 lb), SpO2 100 %. Physical Exam  Vitals reviewed. Constitutional: She is oriented to person, place, and time. She appears well-developed and well-nourished.  HENT:  Head: Normocephalic.  Eyes: Conjunctivae, EOM and lids are normal. Pupils are equal, round, and reactive to light.    VISION OD:  HAND MOTION,, NON CLEARING VITREOUS HEMORRHAGE FOR 6 MONTHS...  OS:  20/100 STABLE  Neck: Normal range of motion.  Cardiovascular: Normal rate, regular rhythm, normal heart sounds and normal pulses.   Respiratory: Effort normal and breath sounds normal.  GI: Soft. Normal appearance and bowel sounds are normal.  Musculoskeletal: Normal range of motion.  Neurological: She is alert and oriented to person, place, and time. She has normal strength and normal reflexes.  Skin: Skin is warm, dry and intact.  Psychiatric: She has a normal mood and affect. Her speech is normal and behavior is normal. Judgment and thought content normal. Cognition and memory are normal.     Assessment/Plan 1. Dense vitreous hemorrhage right eye, recurrent, nonclearing. 2. Proliferative diabetic diabetic retinopathy progressive. 3. No retinal detachment right eye. 4. Type 2 diabetes mellitus, with severe end-stage organ disease. Plan 1. Posterior vitrectomy with endolaser panretinal for evaluation right eye, with installation of silicone oral right eye to regain ambulatory vision and stabilize the intraocular condition of progressive proliferative diabetic retinopathy and recurrent vitreous hemorrhage. Surgery today under local monitored anesthesia  .  Arlicia Paquette A 05/26/2014, 7:36 AM

## 2014-05-26 NOTE — Anesthesia Procedure Notes (Signed)
Procedure Name: MAC Date/Time: 05/26/2014 7:55 AM Performed by: Trixie Deis A Pre-anesthesia Checklist: Patient identified, Timeout performed, Emergency Drugs available, Suction available and Patient being monitored Oxygen Delivery Method: Nasal cannula Placement Confirmation: positive ETCO2

## 2014-05-26 NOTE — Anesthesia Postprocedure Evaluation (Signed)
  Anesthesia Post-op Note  Patient: [name]  Procedure(s) Performed: Procedure(s): PARS PLANA VITRECTOMY 25 GAUGE (Right) INJECTION OF SILICONE OIL (Right) LASER PHOTO ABLATION (Right)  Patient Location: PACU  Anesthesia Type:General  Level of Consciousness: awake, alert  and oriented  Airway and Oxygen Therapy: Patient Spontanous Breathing  Post-op Pain: none  Post-op Assessment: Post-op Vital signs reviewed, Patient's Cardiovascular Status Stable, Respiratory Function Stable, Patent Airway, No signs of Nausea or vomiting and Pain level controlled  Post-op Vital Signs: stable  Last Vitals:  Filed Vitals:   05/26/14 0920  BP:   Pulse:   Temp: 36.7 C  Resp:     Complications: No apparent anesthesia complications

## 2014-05-26 NOTE — Brief Op Note (Signed)
05/26/2014  9:01 AM  PATIENT:  [name]  59 y.o. female  PRE-OPERATIVE DIAGNOSIS:  Vitreous hemorrhage right eye,, dense nonclearing,,, proliferative diabetic retinopathy progressive  POST-OPERATIVE DIAGNOSIS:  Vitreous hemorrhage right eye,,same  PROCEDURE:  Procedure(s): PARS PLANA VITRECTOMY 25 GAUGE (Right) INJECTION OF SILICONE OIL (Right) LASER PHOTO ABLATION (Right)  SURGEON:  Surgeon(s) and Role:    * Hurman Horn, MD - Primary  PHYSICIAN ASSISTANT:   ASSISTANTS: none   ANESTHESIA:   MAC  EBL:  Total I/O In: 400 [I.V.:400] Out: -   BLOOD ADMINISTERED:none  DRAINS: none   LOCAL MEDICATIONS USED:  LIDOCAINE 2%,,10 cc  SPECIMEN:  No Specimen  DISPOSITION OF SPECIMEN:  N/A  COUNTS:  YES  TOURNIQUET:  * No tourniquets in log *  DICTATION: .Other Dictation: Dictation Number [id number]  PLAN OF CARE: Discharge to home after PACU  PATIENT DISPOSITION:  PACU - hemodynamically stable.   Delay start of Pharmacological VTE agent (>24hrs) due to surgical blood loss or risk of bleeding: not applicable

## 2014-05-27 ENCOUNTER — Telehealth (HOSPITAL_COMMUNITY): Payer: Self-pay

## 2014-05-27 ENCOUNTER — Ambulatory Visit (HOSPITAL_COMMUNITY): Payer: 59 | Admitting: Physical Therapy

## 2014-05-27 NOTE — Telephone Encounter (Signed)
Pt had surgery on her eye and was told by her MD to hold PT for 2 weeks.pt will return on 06-17-14

## 2014-05-27 NOTE — Op Note (Signed)
NAMEMarland Kitchen  BRIGETT, [name] NO.:  [id number]  MEDICAL RECORD NO.:  [id number]  LOCATION:  [facility]                         FACILITY:  [facility]  PHYSICIAN:  Clent Demark. Ameri Cahoon, M.D.   DATE OF BIRTH:  [birth year]  DATE OF PROCEDURE:  05/26/2014 DATE OF DISCHARGE:  05/26/2014                              OPERATIVE REPORT   PREOPERATIVE DIAGNOSES: 1. Dense nonclearing vitreous hemorrhage of the right eye. 2. Progressive proliferative diabetic retinopathy, right eye.  POSTOPERATIVE DIAGNOSES: 1. Dense nonclearing vitreous hemorrhage of the right eye. 2. Progressive proliferative diabetic retinopathy, right eye.  PROCEDURES: 1. Posterior vitrectomy with panretinal endolaser photocoagulation,     right eye. 2. Instillation of vitreous substitute - silicone oil 5284     [place], right eye.  SURGEON:  Clent Demark. Amenda Duclos, M.D.  ANESTHESIA:  Local retrobulbar and monitored anesthesia control, lidocaine 2%, 10 mL total.  INDICATIONS FOR PROCEDURE:  The patient is a 59 year old woman who has profound vision loss on the basis of dense nonclearing vitreous hemorrhage for the last 6 months to the level of __________ vision.  The patient understands this is an attempt to clear the vitreous hemorrhage so as to allow for instillation of silicone oil which will induce final quiescence of proliferative diabetic retinopathy and try to decrease recurrences of vitreous hemorrhage.  Endolaser photocoagulation would also be applied.  The patient understands the risks of anesthesia, rare recurrence of death, loss of the eye from the underlying condition, including but not limited to hemorrhage, infection, scarring, need for another surgery, change in vision, loss of vision, progressive disease despite intervention.  Appropriate signed consent was obtained.  DESCRIPTION OF PROCEDURE:  The patient was taken to the operating room. In the operating room, appropriate monitors were followed by  mild sedation.  Proper site selection had been confirmed as the right eye with the surgical staff and surgeon.  Thereafter, under mild sedation, 2% Xylocaine 5 mL injected retrobulbar with additional 5 mL of lidocaine advanced in the fashion of modified Kirk Ruths.  The right periocular region was prepped and draped in usual ophthalmic fashion.  Lid speculum applied.  A 25-gauge trocar placed in the inferotemporal quadrant, placement verified visually and fusion turned on.  Superior trocars were applied.  Core vitrectomy was then begun.  Notable findings were dense dispersed vitreous hemorrhage throughout the vitreous cavity.  I was able to mobilize this preretinal vitreous hemorrhage without difficulty. Endolaser photocoagulation was placed anteriorly, but also around an apparent site of leakage and a flat neovascularization elsewhere along the superotemporal arcade.  These areas were treated with direct ablation.  Hemostasis was maintained spontaneously.  A 20-gauge MVR blade was then used to enlarge the superotemporal sclerotomy after fluid- air exchange had been completed.  Through this sclerotomy, a 20-gauge silastic cannula was then used to inject the silicone oil under direct visualization.  Excellent oil fill was obtained.  A small bubble of air was left in place.  The superotemporal sclerotomy was closed with 7-0 Vicryl suture.  The infusion site was then also closed with 7-0 Vicryl suture, as was the superonasal trocar site.  Wounds were secured. Excellent intraocular pressure and formation of the globe was  confirmed. The conjunctiva was closed with 7-0 Vicryl suture, knots were buried. Subconjunctival Decadron applied.  The intraocular pressure was assessed and found to be adequate.  Subconjunctival Decadron applied.  Sterile patch and Fox shield applied.  The patient tolerated the procedure without complication and taken to PACU in good and stable condition.     Clent Demark  Mattilynn Forrer, M.D.     GAR/MEDQ  D:  05/26/2014  T:  05/27/2014  Job:  [id number]

## 2014-05-28 ENCOUNTER — Encounter (HOSPITAL_COMMUNITY): Payer: Self-pay | Admitting: Ophthalmology

## 2014-06-03 ENCOUNTER — Encounter (HOSPITAL_COMMUNITY): Payer: 59

## 2014-06-05 ENCOUNTER — Encounter (HOSPITAL_COMMUNITY): Payer: 59 | Admitting: Physical Therapy

## 2014-06-09 ENCOUNTER — Encounter (HOSPITAL_COMMUNITY): Payer: 59 | Admitting: Physical Therapy

## 2014-06-11 ENCOUNTER — Encounter (HOSPITAL_COMMUNITY): Payer: 59

## 2014-06-15 ENCOUNTER — Encounter (HOSPITAL_COMMUNITY): Payer: 59 | Admitting: Physical Therapy

## 2014-06-17 ENCOUNTER — Encounter (HOSPITAL_COMMUNITY): Payer: 59 | Admitting: Physical Therapy

## 2014-06-22 ENCOUNTER — Encounter (HOSPITAL_COMMUNITY): Payer: 59 | Admitting: Physical Therapy

## 2014-06-24 ENCOUNTER — Encounter (HOSPITAL_COMMUNITY): Payer: 59 | Admitting: Physical Therapy

## 2014-06-29 ENCOUNTER — Encounter (HOSPITAL_COMMUNITY): Payer: 59 | Admitting: Physical Therapy

## 2014-07-01 ENCOUNTER — Encounter (HOSPITAL_COMMUNITY): Payer: 59

## 2014-07-06 ENCOUNTER — Encounter (HOSPITAL_COMMUNITY): Payer: 59 | Admitting: Physical Therapy

## 2014-07-08 ENCOUNTER — Encounter (HOSPITAL_COMMUNITY): Payer: 59 | Admitting: Physical Therapy

## 2014-11-24 ENCOUNTER — Other Ambulatory Visit: Payer: Self-pay | Admitting: Ophthalmology

## 2014-11-27 ENCOUNTER — Encounter (HOSPITAL_COMMUNITY): Payer: Self-pay | Admitting: *Deleted

## 2014-11-30 ENCOUNTER — Encounter (HOSPITAL_COMMUNITY)
Admission: RE | Disposition: A | Discharge status: Home / Self Care | Payer: Self-pay | Source: Ambulatory Visit | Attending: Ophthalmology

## 2014-11-30 ENCOUNTER — Ambulatory Visit (HOSPITAL_COMMUNITY): Payer: 59 | Admitting: Anesthesiology

## 2014-11-30 ENCOUNTER — Encounter (HOSPITAL_COMMUNITY): Payer: Self-pay | Admitting: *Deleted

## 2014-11-30 ENCOUNTER — Ambulatory Visit (HOSPITAL_COMMUNITY)
Admission: RE | Admit: 2014-11-30 | Discharge: 2014-11-30 | Disposition: A | Discharge status: Home / Self Care | Payer: 59 | Source: Ambulatory Visit | Attending: Ophthalmology | Admitting: Ophthalmology

## 2014-11-30 DIAGNOSIS — E11359 Type 2 diabetes mellitus with proliferative diabetic retinopathy without macular edema: Secondary | ICD-10-CM | POA: Diagnosis not present

## 2014-11-30 DIAGNOSIS — H4312 Vitreous hemorrhage, left eye: Secondary | ICD-10-CM | POA: Insufficient documentation

## 2014-11-30 DIAGNOSIS — I10 Essential (primary) hypertension: Secondary | ICD-10-CM | POA: Insufficient documentation

## 2014-11-30 HISTORY — PX: AIR/FLUID EXCHANGE: SHX6494

## 2014-11-30 HISTORY — PX: INJECTION OF SILICONE OIL: SHX6422

## 2014-11-30 HISTORY — PX: PARS PLANA VITRECTOMY: SHX2166

## 2014-11-30 LAB — BASIC METABOLIC PANEL
Anion gap: 8 (ref 5–15)
BUN: 18 mg/dL (ref 6–20)
CALCIUM: 9.4 mg/dL (ref 8.9–10.3)
CO2: 24 mmol/L (ref 22–32)
CREATININE: 0.7 mg/dL (ref 0.44–1.00)
Chloride: 109 mmol/L (ref 101–111)
GFR calc non Af Amer: >60 mL/min (ref >60)
Glucose, Bld: 120 mg/dL — ABNORMAL HIGH (ref 65–99)
Potassium: 4.3 mmol/L (ref 3.5–5.1)
SODIUM: 141 mmol/L (ref 135–145)

## 2014-11-30 LAB — CBC
HCT: 41.3 % (ref 36.0–46.0)
Hemoglobin: 13.6 g/dL (ref 12.0–15.0)
MCH: 29.3 pg (ref 26.0–34.0)
MCHC: 32.9 g/dL (ref 30.0–36.0)
MCV: 89 fL (ref 78.0–100.0)
PLATELETS: 235 10*3/uL (ref 150–400)
RBC: 4.64 MIL/uL (ref 3.87–5.11)
RDW: 14 % (ref 11.5–15.5)
WBC: 8 10*3/uL (ref 4.0–10.5)

## 2014-11-30 LAB — GLUCOSE, CAPILLARY
GLUCOSE-CAPILLARY: 121 mg/dL — AB (ref 65–99)
GLUCOSE-CAPILLARY: 80 mg/dL (ref 65–99)
GLUCOSE-CAPILLARY: 62 mg/dL — AB (ref 65–99)
GLUCOSE-CAPILLARY: 70 mg/dL (ref 65–99)

## 2014-11-30 SURGERY — PARS PLANA VITRECTOMY WITH 25 GAUGE
Anesthesia: Monitor Anesthesia Care | Site: Eye | Laterality: Left

## 2014-11-30 MED ORDER — SODIUM CHLORIDE 0.9 % IV SOLN
INTRAVENOUS | Status: DC
  Administered 2014-11-30 (×2): via INTRAVENOUS

## 2014-11-30 MED ORDER — LIDOCAINE HCL (CARDIAC) 20 MG/ML IV SOLN
INTRAVENOUS | Status: DC | PRN
  Administered 2014-11-30: 60 mg via INTRAVENOUS

## 2014-11-30 MED ORDER — BSS PLUS IO SOLN
INTRAOCULAR | Status: AC
  Filled 2014-11-30: qty 500

## 2014-11-30 MED ORDER — POLYMYXIN B SULFATE 500000 UNITS IJ SOLR
INTRAMUSCULAR | Status: AC
  Filled 2014-11-30: qty 1

## 2014-11-30 MED ORDER — PROPOFOL 10 MG/ML IV BOLUS
INTRAVENOUS | Status: DC | PRN
  Administered 2014-11-30: 60 mg via INTRAVENOUS

## 2014-11-30 MED ORDER — LIDOCAINE HCL 2 % IJ SOLN
INTRAMUSCULAR | Status: AC
  Filled 2014-11-30: qty 20

## 2014-11-30 MED ORDER — 0.9 % SODIUM CHLORIDE (POUR BTL) OPTIME
TOPICAL | Status: DC | PRN
  Administered 2014-11-30: 200 mL

## 2014-11-30 MED ORDER — NA CHONDROIT SULF-NA HYALURON 40-30 MG/ML IO SOLN
INTRAOCULAR | Status: AC
  Filled 2014-11-30: qty 0.5

## 2014-11-30 MED ORDER — HYPROMELLOSE (GONIOSCOPIC) 2.5 % OP SOLN
OPHTHALMIC | Status: DC | PRN
  Administered 2014-11-30: 2 [drp] via OPHTHALMIC

## 2014-11-30 MED ORDER — TETRACAINE HCL 0.5 % OP SOLN
OPHTHALMIC | Status: AC
  Filled 2014-11-30: qty 2

## 2014-11-30 MED ORDER — EPINEPHRINE HCL 1 MG/ML IJ SOLN
INTRAMUSCULAR | Status: AC
  Filled 2014-11-30: qty 1

## 2014-11-30 MED ORDER — DEXAMETHASONE SODIUM PHOSPHATE 10 MG/ML IJ SOLN
INTRAMUSCULAR | Status: DC | PRN
  Administered 2014-11-30: 10 mg via INTRAVENOUS

## 2014-11-30 MED ORDER — CYCLOPENTOLATE HCL 1 % OP SOLN
OPHTHALMIC | Status: AC
  Administered 2014-11-30: 1 [drp] via OPHTHALMIC
  Filled 2014-11-30: qty 2

## 2014-11-30 MED ORDER — SODIUM CHLORIDE 0.9 % IJ SOLN
INTRAMUSCULAR | Status: AC
  Filled 2014-11-30: qty 10

## 2014-11-30 MED ORDER — MEPERIDINE HCL 25 MG/ML IJ SOLN: 6.2500 mg | INTRAMUSCULAR | Status: DC | PRN

## 2014-11-30 MED ORDER — SODIUM HYALURONATE 10 MG/ML IO SOLN
INTRAOCULAR | Status: AC
  Filled 2014-11-30: qty 0.85

## 2014-11-30 MED ORDER — LIDOCAINE HCL 2 % IJ SOLN
INTRAMUSCULAR | Status: DC | PRN
  Administered 2014-11-30: 20 mL

## 2014-11-30 MED ORDER — STERILE WATER FOR INJECTION IJ SOLN
INTRAMUSCULAR | Status: DC | PRN
  Administered 2014-11-30: 200 mL

## 2014-11-30 MED ORDER — GENTAMICIN SULFATE 40 MG/ML IJ SOLN
INTRAMUSCULAR | Status: AC
  Filled 2014-11-30: qty 2

## 2014-11-30 MED ORDER — HYDROMORPHONE HCL 1 MG/ML IJ SOLN: 0.2500 mg | INTRAMUSCULAR | Status: DC | PRN

## 2014-11-30 MED ORDER — CYCLOPENTOLATE HCL 1 % OP SOLN
1.0000 [drp] | OPHTHALMIC | Status: AC | PRN
  Administered 2014-11-30 (×3): 1 [drp] via OPHTHALMIC

## 2014-11-30 MED ORDER — HYPROMELLOSE (GONIOSCOPIC) 2.5 % OP SOLN
OPHTHALMIC | Status: AC
  Filled 2014-11-30: qty 15

## 2014-11-30 MED ORDER — PROMETHAZINE HCL 25 MG/ML IJ SOLN: 6.2500 mg | INTRAMUSCULAR | Status: DC | PRN

## 2014-11-30 MED ORDER — PHENYLEPHRINE HCL 2.5 % OP SOLN
1.0000 [drp] | OPHTHALMIC | Status: AC | PRN
  Administered 2014-11-30 (×3): 1 [drp] via OPHTHALMIC

## 2014-11-30 MED ORDER — DEXAMETHASONE SODIUM PHOSPHATE 10 MG/ML IJ SOLN
INTRAMUSCULAR | Status: AC
  Filled 2014-11-30: qty 1

## 2014-11-30 MED ORDER — EPINEPHRINE HCL 1 MG/ML IJ SOLN
INTRAMUSCULAR | Status: DC | PRN
  Administered 2014-11-30: 13:00:00

## 2014-11-30 MED ORDER — PHENYLEPHRINE HCL 2.5 % OP SOLN
OPHTHALMIC | Status: AC
  Administered 2014-11-30: 1 [drp] via OPHTHALMIC
  Filled 2014-11-30: qty 2

## 2014-11-30 SURGICAL SUPPLY — 60 items
APPLICATOR COTTON TIP 6IN STRL (MISCELLANEOUS) ×3 IMPLANT
APPLICATOR DR MATTHEWS STRL (MISCELLANEOUS) IMPLANT
BLADE 10 SAFETY STRL DISP (BLADE) IMPLANT
BLADE MVR KNIFE 20G (BLADE) ×3 IMPLANT
CANNULA ANT CHAM MAIN (OPHTHALMIC RELATED) IMPLANT
CANNULA VLV SOFT TIP 25GA (OPHTHALMIC) ×3 IMPLANT
CORDS BIPOLAR (ELECTRODE) IMPLANT
COVER MAYO STAND STRL (DRAPES) IMPLANT
DRAPE INCISE 51X51 W/FILM STRL (DRAPES) IMPLANT
DRAPE OPHTHALMIC 77X100 STRL (CUSTOM PROCEDURE TRAY) ×3 IMPLANT
FILTER BLUE MILLIPORE (MISCELLANEOUS) IMPLANT
FORCEPS ECKARDT ILM 25G SERR (OPHTHALMIC RELATED) IMPLANT
FORCEPS GRIESHABER ILM 25G A (INSTRUMENTS) IMPLANT
FORCEPS HORIZONTAL 25G DISP (OPHTHALMIC RELATED) IMPLANT
FORCEPS ILM 25G DSP TIP (MISCELLANEOUS) IMPLANT
GAS AUTO FILL CONSTEL (OPHTHALMIC)
GAS AUTO FILL CONSTELLATION (OPHTHALMIC) IMPLANT
GLOVE BIO SURGEON STRL SZ8.5 (GLOVE) ×6 IMPLANT
GLOVE BIOGEL PI IND STRL 8 (GLOVE) ×2 IMPLANT
GLOVE BIOGEL PI INDICATOR 8 (GLOVE) ×4
GLOVE SS BIOGEL STRL SZ 8.5 (GLOVE) ×1 IMPLANT
GLOVE SUPERSENSE BIOGEL SZ 8.5 (GLOVE) ×2
GOWN STRL REUS W/ TWL LRG LVL3 (GOWN DISPOSABLE) ×1 IMPLANT
GOWN STRL REUS W/ TWL XL LVL3 (GOWN DISPOSABLE) ×3 IMPLANT
GOWN STRL REUS W/TWL LRG LVL3 (GOWN DISPOSABLE) ×2
GOWN STRL REUS W/TWL XL LVL3 (GOWN DISPOSABLE) ×6
KIT BASIN OR (CUSTOM PROCEDURE TRAY) ×3 IMPLANT
KNIFE CRESCENT 2.5 55 ANG (BLADE) IMPLANT
LENS BIOM SUPER VIEW SET DISP (OPHTHALMIC RELATED) ×3 IMPLANT
MICROPICK 25G (MISCELLANEOUS)
NEEDLE 18GX1X1/2 (RX/OR ONLY) (NEEDLE) ×3 IMPLANT
NEEDLE 25GX 5/8IN NON SAFETY (NEEDLE) ×3 IMPLANT
NEEDLE FILTER BLUNT 18X 1/2SAF (NEEDLE) ×2
NEEDLE FILTER BLUNT 18X1 1/2 (NEEDLE) ×1 IMPLANT
NEEDLE HYPO 25GX1X1/2 BEV (NEEDLE) IMPLANT
NS IRRIG 1000ML POUR BTL (IV SOLUTION) ×3 IMPLANT
OIL SILICONE OPHTHALMIC 1000 (Ophthalmic Related) ×3 IMPLANT
PACK FRAGMATOME (OPHTHALMIC) IMPLANT
PACK VITRECTOMY CUSTOM (CUSTOM PROCEDURE TRAY) ×3 IMPLANT
PAD ARMBOARD 7.5X6 YLW CONV (MISCELLANEOUS) ×3 IMPLANT
PAK PIK VITRECTOMY CVS 25GA (OPHTHALMIC) ×3 IMPLANT
PENCIL BIPOLAR 25GA STR DISP (OPHTHALMIC RELATED) IMPLANT
PICK MICROPICK 25G (MISCELLANEOUS) IMPLANT
PROBE LASER ILLUM FLEX CVD 25G (OPHTHALMIC) ×3 IMPLANT
ROLLS DENTAL (MISCELLANEOUS) IMPLANT
SCRAPER DIAMOND 25GA (OPHTHALMIC RELATED) IMPLANT
SET INJECTOR OIL FLUID CONSTEL (OPHTHALMIC) ×3 IMPLANT
STOCKINETTE IMPERVIOUS 9X36 MD (GAUZE/BANDAGES/DRESSINGS) ×6 IMPLANT
STOPCOCK 4 WAY LG BORE MALE ST (IV SETS) IMPLANT
SUT ETHILON 10 0 CS140 6 (SUTURE) IMPLANT
SUT ETHILON 8 0 BV130 4 (SUTURE) IMPLANT
SUT MERSILENE 5 0 RD 1 DA (SUTURE) IMPLANT
SUT PROLENE 10 0 CIF 4 DA (SUTURE) IMPLANT
SUT VICRYL 7 0 TG140 8 (SUTURE) IMPLANT
SYR 30ML SLIP (SYRINGE) IMPLANT
SYR 5ML LL (SYRINGE) IMPLANT
SYR TB 1ML LUER SLIP (SYRINGE) IMPLANT
SYRINGE 10CC LL (SYRINGE) ×3 IMPLANT
WATER STERILE IRR 1000ML POUR (IV SOLUTION) ×3 IMPLANT
WIPE INSTRUMENT VISIWIPE 73X73 (MISCELLANEOUS) IMPLANT

## 2014-11-30 NOTE — Anesthesia Postprocedure Evaluation (Signed)
  Anesthesia Post-op Note  Patient: [name]  Procedure(s) Performed: Procedure(s): PARS PLANA VITRECTOMY WITH 25 GAUGE with endolaser (Left) INJECTION OF SILICONE OIL (Left) AIR/FLUID EXCHANGE LEFT EYE (Left)  Patient Location: PACU  Anesthesia Type:MAC  Level of Consciousness: awake and alert   Airway and Oxygen Therapy: Patient Spontanous Breathing  Post-op Pain: none  Post-op Assessment: Post-op Vital signs reviewed              Post-op Vital Signs: Reviewed  Last Vitals:  Filed Vitals:   11/30/14 1600  BP:   Pulse: 86  Temp: 36.4 C  Resp: 22    Complications: No apparent anesthesia complications

## 2014-11-30 NOTE — H&P (Signed)
59 year old woman with long-standing history of diabetes mellitus with advanced diabetic retinopathy of each eye with a notable history of dense recurrent vitreous hemorrhages in each eye in the past. She has new profound vision loss in left eye and the basis of dense recurrent non-vitreous hemorrhage in the pseudophakic left eye. She has recurrent vitreous hemorrhages has necessitated use of intravitreal silicone oil removal of vitreous substitute placement so as to diminish the likelihood and chance of loss of ambulatory vision from recurrent vitreous hemorrhage she understands that the nature of advanced ischemic of advanced ischemic diseases. These issues and may require similar vitrectomy with endolaser photo coagulation of the left eye, with instillation of intravitreal silicone oil left eye.  Alert awake oriented 3 is after name. She understands the risk of anesthesia including rare occurrence death loss of the eye including not limited to hemorrhage, infection, scarring, need another surgery, no change of vision, loss of vision and progression of disease by intervention. After a lengthy signed consent I will had been discussed in the office and also in the preoperative setting patient signs wished proceed with surgical intervention    she is alert awake and oriented. Chest examination normal. Sinus rhythm on the EKG.  There is no m edema.  Impression 1 dense recurrent vitreous hemorrhage left eye #2 proliferative retinopathy left eye #3 advanced ischemic retinal last disease, diabetic retinopathy left eye  Plan 1 surgical intervention via vitrectomy, endolaser and instillation of silicone oil left eye under local retrobulbar with monitored anesthesia control.

## 2014-11-30 NOTE — Transfer of Care (Signed)
Immediate Anesthesia Transfer of Care Note  Patient: [name]  Procedure(s) Performed: Procedure(s): PARS PLANA VITRECTOMY WITH 25 GAUGE with endolaser (Left) INJECTION OF SILICONE OIL (Left) AIR/FLUID EXCHANGE LEFT EYE (Left)  Patient Location: PACU  Anesthesia Type:MAC  Level of Consciousness: awake, alert , oriented and patient cooperative  Airway & Oxygen Therapy: Patient Spontanous Breathing and Patient connected to nasal cannula oxygen  Post-op Assessment: Report given to RN, Post -op Vital signs reviewed and stable and Patient moving all extremities  Post vital signs: Reviewed and stable  Last Vitals:  Filed Vitals:   11/30/14 1036  BP: 178/74  Pulse: 90  Temp: 36.1 C  Resp: 20    Complications: No apparent anesthesia complications

## 2014-11-30 NOTE — Anesthesia Procedure Notes (Signed)
Procedure Name: MAC Date/Time: 11/30/2014 2:22 PM Performed by: Willeen Cass P Pre-anesthesia Checklist: Patient identified, Timeout performed, Emergency Drugs available, Suction available and Patient being monitored Patient Re-evaluated:Patient Re-evaluated prior to inductionOxygen Delivery Method: Nasal cannula Intubation Type: IV induction Dental Injury: Teeth and Oropharynx as per pre-operative assessment

## 2014-11-30 NOTE — Op Note (Signed)
59 year old with profound recurrent vision loss left eye on the basis of dense recurrent vitreous hemorrhage with proliferative diabetic retinopathy,, progressive despite panretinal photocoagulation. The circumflex this is a surgical temp to remove the vitreous opacification and to deliver media clarification via vitrectomy and installation of silicone oil  of the left eye.  Preoperative diagnoses #1 dense recurrent vitreous hemorrhage left eye #2 is proliferative never get number the left eye #3 ischemic maculopathy left eye long-standing #4 pseudophakia left eye postoperative diagnoses 12 for the same  Procedure #1 posterior vitrectomy of the left eye with endolaser panretinal photic regulation and focal laser photo coagulation. #2 fluid exchange #3 installation silicone oil 9628 sent T Stokes left eye    anesthesia local with retrobulbar and monitored anesthesia control  Surgeon Hurman Horn M.D.  Patient for procedure patient is a 12-year-old woman who has probably lost basis of dense nonclearing vitreous hemorrhage left eye affecting her activities daily living laboratory vision. They have the media clear done 1. place silicone oil so as to stabilize her overall condition of the eye. She has had associated arcus death.   procedure in detail.  Patient taken to the operating room and appropriate monitoring instituted. [The left eye was done for surgical cyanosis or timeout thereafter no mild sedation 2% Xylocaine 5 mL injected retrobulbar with additional 5 mL laterally fashion modified Kirk Ruths. [Region sterilely prepped draped usual fashion. Lid speculum applied. 25-gauge trocar placed in the inferotemporal quadrant placement verified visually. Infusion turned on. Spear trochars applied. Cortectomy was then begun. Vitreous skirt circumcised that the previously completed and now has some shaving inferiorly is some inspissated vitreous hemorrhage is identified. Preretinal hemorrhage was aspirated.  Fluid fluid air exchange completed. Under air endolaser photo regulation placed a focal peripheral pattern although is limited because of nearly wall-to-wall laser.  An air-silicone oil exchange carried out active injection of silicone oil 3662 CS was then injected. Excellent oil fill was obtained with appropriate intractable pressures. Trochars removed after placement of 70 Vicryls suture to close the sclerotomy is. Infusion removed and similarly closed. The intraocular pressure assessment be adequate. Wounds are secure. Subconjunctival Decadron applied. Sterile patch and Fox shield applied. Patient taken to the recovery room in good and stable condition hemodynamically.

## 2014-11-30 NOTE — Anesthesia Preprocedure Evaluation (Signed)
Anesthesia Evaluation  Patient identified by MRN, date of birth, ID band Patient awake    Reviewed: Allergy & Precautions, NPO status , Patient's Chart, lab work & pertinent test results  History of Anesthesia Complications (+) PONV, Family history of anesthesia reaction and history of anesthetic complications (family hx of PONV. Personal hx of N&V after IV injection of lidocaine for GA. Has had spinal anesthesia and retrobulbar blocks in the past with no issues)  Airway Mallampati: II  TM Distance: >3 FB Neck ROM: Full   Comment: Goiter present. Pt reports no difficulty with breathing or swallowing.  Dental  (+) Teeth Intact, Dental Advisory Given   Pulmonary  breath sounds clear to auscultation        Cardiovascular hypertension, Pt. on medications Rhythm:Regular Rate:Normal     Neuro/Psych    GI/Hepatic   Endo/Other  diabetes, Well Controlled, Type 2, Oral Hypoglycemic AgentsGoiter present  Renal/GU      Musculoskeletal  (+) Arthritis -, Osteoarthritis,    Abdominal   Peds  Hematology   Anesthesia Other Findings   Reproductive/Obstetrics                             Anesthesia Physical  Anesthesia Plan  ASA: III  Anesthesia Plan: MAC   Post-op Pain Management:    Induction:   Airway Management Planned: Nasal Cannula  Additional Equipment:   Intra-op Plan:   Post-operative Plan:   Informed Consent: I have reviewed the patients History and Physical, chart, labs and discussed the procedure including the risks, benefits and alternatives for the proposed anesthesia with the patient or authorized representative who has indicated his/her understanding and acceptance.   Dental advisory given  Plan Discussed with: CRNA  Anesthesia Plan Comments:         Anesthesia Quick Evaluation                                  Anesthesia Evaluation  Patient identified by MRN, date of  birth, ID band Patient awake    Reviewed: Allergy & Precautions, NPO status , Patient's Chart, lab work & pertinent test results  History of Anesthesia Complications (+) PONV and Family history of anesthesia reactionHistory of anesthetic complications: family hx of PONV. Personal hx of N&V after IV injection of lidocaine for GA. Has had spinal anesthesia and retrobulbar blocks in the past with no issues.  Airway Mallampati: II  TM Distance: >3 FB Neck ROM: Full   Comment: Goiter present. Pt reports no difficulty with breathing or swallowing.  Dental  (+) Teeth Intact, Dental Advisory Given   Pulmonary          Cardiovascular hypertension, Pt. on medications     Neuro/Psych    GI/Hepatic   Endo/Other  diabetes, Well Controlled, Type 2, Oral Hypoglycemic AgentsGoiter present  Renal/GU      Musculoskeletal  (+) Arthritis -, Osteoarthritis,    Abdominal   Peds  Hematology   Anesthesia Other Findings   Reproductive/Obstetrics                           Anesthesia Physical Anesthesia Plan  ASA: III  Anesthesia Plan: MAC   Post-op Pain Management:    Induction:   Airway Management Planned: Nasal Cannula  Additional Equipment:   Intra-op Plan:   Post-operative Plan:   Informed Consent:  I have reviewed the patients History and Physical, chart, labs and discussed the procedure including the risks, benefits and alternatives for the proposed anesthesia with the patient or authorized representative who has indicated his/her understanding and acceptance.   Dental advisory given  Plan Discussed with: CRNA and Anesthesiologist  Anesthesia Plan Comments:         Anesthesia Quick Evaluation

## 2014-12-01 ENCOUNTER — Encounter (HOSPITAL_COMMUNITY): Payer: Self-pay | Admitting: Ophthalmology

## 2015-02-22 NOTE — Therapy (Signed)
[place], [place], [address] Phone: [phone]   Fax:  [phone]  Patient Details  Name: REGENA DELUCCHI MRN: [id number] Date of Birth: [birth year] Referring Provider:  Renette Butters, MD  Encounter Date: 02/22/2015  PHYSICAL THERAPY DISCHARGE SUMMARY  Visits from Start of Care: 2  Current functional level related to goals / functional outcomes: Patient has not returned to skilled PT services since last session in February    Remaining deficits: Unable to assess    Education / Equipment: N/A  Plan: Patient agrees to discharge.  Patient goals were not met. Patient is being discharged due to not returning since the last visit.  ?????       Deniece Ree PT, DPT [place] [address] [place], [place], [address] Phone: [phone]   Fax:  [phone]

## 2015-05-03 ENCOUNTER — Other Ambulatory Visit (HOSPITAL_COMMUNITY): Payer: Self-pay | Admitting: Internal Medicine

## 2015-05-03 DIAGNOSIS — Z1231 Encounter for screening mammogram for malignant neoplasm of breast: Secondary | ICD-10-CM

## 2015-05-12 ENCOUNTER — Ambulatory Visit (HOSPITAL_COMMUNITY): Payer: Self-pay

## 2016-12-21 ENCOUNTER — Ambulatory Visit (HOSPITAL_COMMUNITY): Admit: 2016-12-21 | Payer: Self-pay | Admitting: Ophthalmology

## 2016-12-21 ENCOUNTER — Encounter (HOSPITAL_COMMUNITY): Payer: Self-pay

## 2016-12-21 SURGERY — PARS PLANA VITRECTOMY WITH 25 GAUGE
Anesthesia: Monitor Anesthesia Care | Laterality: Left

## 2019-04-20 ENCOUNTER — Other Ambulatory Visit: Payer: Self-pay

## 2019-04-20 ENCOUNTER — Emergency Department (HOSPITAL_COMMUNITY)
Admission: EM | Admit: 2019-04-20 | Discharge: 2019-04-20 | Disposition: A | Discharge status: Home / Self Care | Payer: Self-pay | Attending: Emergency Medicine | Admitting: Emergency Medicine

## 2019-04-20 ENCOUNTER — Telehealth: Payer: Self-pay | Admitting: Adult Health

## 2019-04-20 ENCOUNTER — Ambulatory Visit
Admission: EM | Admit: 2019-04-20 | Discharge: 2019-04-20 | Disposition: A | Discharge status: Home / Self Care | Payer: HRSA Program

## 2019-04-20 ENCOUNTER — Encounter (HOSPITAL_COMMUNITY): Payer: Self-pay | Admitting: *Deleted

## 2019-04-20 ENCOUNTER — Emergency Department (HOSPITAL_COMMUNITY): Payer: Self-pay

## 2019-04-20 DIAGNOSIS — Z20822 Contact with and (suspected) exposure to covid-19: Secondary | ICD-10-CM | POA: Diagnosis not present

## 2019-04-20 DIAGNOSIS — Z96641 Presence of right artificial hip joint: Secondary | ICD-10-CM | POA: Insufficient documentation

## 2019-04-20 DIAGNOSIS — E119 Type 2 diabetes mellitus without complications: Secondary | ICD-10-CM | POA: Insufficient documentation

## 2019-04-20 DIAGNOSIS — Z79899 Other long term (current) drug therapy: Secondary | ICD-10-CM | POA: Insufficient documentation

## 2019-04-20 DIAGNOSIS — U071 COVID-19: Secondary | ICD-10-CM

## 2019-04-20 DIAGNOSIS — Z7984 Long term (current) use of oral hypoglycemic drugs: Secondary | ICD-10-CM | POA: Insufficient documentation

## 2019-04-20 DIAGNOSIS — I10 Essential (primary) hypertension: Secondary | ICD-10-CM | POA: Insufficient documentation

## 2019-04-20 LAB — COMPREHENSIVE METABOLIC PANEL
ALT: 47 U/L — ABNORMAL HIGH (ref 0–44)
AST: 53 U/L — ABNORMAL HIGH (ref 15–41)
Albumin: 3.2 g/dL — ABNORMAL LOW (ref 3.5–5.0)
Alkaline Phosphatase: 59 U/L (ref 38–126)
Anion gap: 13 (ref 5–15)
BUN: 14 mg/dL (ref 8–23)
CO2: 24 mmol/L (ref 22–32)
Calcium: 8.9 mg/dL (ref 8.9–10.3)
Chloride: 95 mmol/L — ABNORMAL LOW (ref 98–111)
Creatinine, Ser: 0.9 mg/dL (ref 0.44–1.00)
GFR calc Af Amer: >60 mL/min (ref >60)
GFR calc non Af Amer: >60 mL/min (ref >60)
Glucose, Bld: 312 mg/dL — ABNORMAL HIGH (ref 70–99)
Potassium: 4.3 mmol/L (ref 3.5–5.1)
Sodium: 132 mmol/L — ABNORMAL LOW (ref 135–145)
Total Bilirubin: 0.8 mg/dL (ref 0.3–1.2)
Total Protein: 6.8 g/dL (ref 6.5–8.1)

## 2019-04-20 LAB — CBC WITH DIFFERENTIAL/PLATELET
Abs Immature Granulocytes: 0.06 10*3/uL (ref 0.00–0.07)
Basophils Absolute: 0 10*3/uL (ref 0.0–0.1)
Basophils Relative: 0 %
Eosinophils Absolute: 0 10*3/uL (ref 0.0–0.5)
Eosinophils Relative: 0 %
HCT: 41.8 % (ref 36.0–46.0)
Hemoglobin: 13.6 g/dL (ref 12.0–15.0)
Immature Granulocytes: 1 %
Lymphocytes Relative: 8 %
Lymphs Abs: 0.5 10*3/uL — ABNORMAL LOW (ref 0.7–4.0)
MCH: 29.4 pg (ref 26.0–34.0)
MCHC: 32.5 g/dL (ref 30.0–36.0)
MCV: 90.5 fL (ref 80.0–100.0)
Monocytes Absolute: 0.4 10*3/uL (ref 0.1–1.0)
Monocytes Relative: 6 %
Neutro Abs: 5.3 10*3/uL (ref 1.7–7.7)
Neutrophils Relative %: 85 %
Platelets: 226 10*3/uL (ref 150–400)
RBC: 4.62 MIL/uL (ref 3.87–5.11)
RDW: 13.6 % (ref 11.5–15.5)
WBC: 6.3 10*3/uL (ref 4.0–10.5)
nRBC: 0 % (ref 0.0–0.2)

## 2019-04-20 LAB — POC SARS CORONAVIRUS 2 AG -  ED: SARS Coronavirus 2 Ag: POSITIVE — AB

## 2019-04-20 LAB — POCT FASTING CBG KUC MANUAL ENTRY: POCT Glucose (KUC): 265 mg/dL — AB (ref 70–99)

## 2019-04-20 MED ORDER — SODIUM CHLORIDE 0.9 % IV BOLUS
1000.0000 mL | Freq: Once | INTRAVENOUS | Status: AC
  Administered 2019-04-20: 1000 mL via INTRAVENOUS

## 2019-04-20 MED ORDER — ALBUTEROL SULFATE HFA 108 (90 BASE) MCG/ACT IN AERS
4.0000 | INHALATION_SPRAY | RESPIRATORY_TRACT | Status: DC | PRN
  Administered 2019-04-20: 15:00:00 4 via RESPIRATORY_TRACT
  Filled 2019-04-20: qty 6.7

## 2019-04-20 MED ORDER — ONDANSETRON 4 MG PO TBDP: ORAL_TABLET | ORAL | 0 refills | Status: DC

## 2019-04-20 MED ORDER — ALBUTEROL SULFATE HFA 108 (90 BASE) MCG/ACT IN AERS: 2.0000 | INHALATION_SPRAY | RESPIRATORY_TRACT | 0 refills | Status: DC | PRN

## 2019-04-20 NOTE — ED Triage Notes (Signed)
Pt was sent to er from urgent care after testing positive for covid, pt reports that she had one episode of diarrhea and vomiting yesterday, chills, main complaint in triage is generalized weakness.

## 2019-04-20 NOTE — Discharge Instructions (Addendum)
Your point-of-care COVID-19 test was positive Patient was advised to go to ED due to low oxygen saturation and increased heart rate and weakness.

## 2019-04-20 NOTE — ED Provider Notes (Signed)
[facility]    CSN: [id number] Arrival date & time: 04/20/19  1125      History   Chief Complaint Chief Complaint  Patient presents with  . weakness    HPI [name] is a 64 y.o. female.   [name] 64 years old female with history of diabetes presented to the urgent care with a complaint of sinus pressure sinus pain for the past 11 days.  Report dry mouth, generalized weakness and diarrhea since yesterday.  Has 1 episode of vomiting.  Denies sick exposure to COVID, flu or strep.  Denies recent travel.  Denies aggravating or alleviating symptoms.  Denies previous COVID infection.   Denies fever, chills, fatigue, nasal congestion, rhinorrhea, sore throat, cough, SOB, wheezing, chest pain, nausea, vomiting, changes in bowel or bladder habits.       Past Medical History:  Diagnosis Date  . Arthritis    OSTEO  . Diabetes mellitus without complication ([place])    TYPE 2  . Family history of adverse reaction to anesthesia    Mother - N/V  . Goiter   . Hypertension   . PONV (postoperative nausea and vomiting)   . Retinopathy     Patient Active Problem List   Diagnosis Date Noted  . DJD (degenerative joint disease) 04/21/2014    Past Surgical History:  Procedure Laterality Date  . ABDOMINAL HYSTERECTOMY    . AIR/FLUID EXCHANGE Left 11/30/2014   Procedure: AIR/FLUID EXCHANGE LEFT EYE;  Surgeon: Hurman Horn, MD;  Location: [facility];  Service: Ophthalmology;  Laterality: Left;  . BACK SURGERY    . CESAREAN SECTION     x 2  . EYE SURGERY    . INJECTION OF SILICONE OIL Right [id number]   Procedure: INJECTION OF SILICONE OIL;  Surgeon: Hurman Horn, MD;  Location: [facility];  Service: Ophthalmology;  Laterality: Right;  . INJECTION OF SILICONE OIL Left [id number]   Procedure: INJECTION OF SILICONE OIL;  Surgeon: Hurman Horn, MD;  Location: [facility];  Service: Ophthalmology;  Laterality: Left;  . LASER PHOTO ABLATION Right 05/26/2014   Procedure: LASER PHOTO  ABLATION;  Surgeon: Hurman Horn, MD;  Location: [facility];  Service: Ophthalmology;  Laterality: Right;  . PARS PLANA VITRECTOMY Right 05/26/2014   Procedure: PARS PLANA VITRECTOMY 25 GAUGE;  Surgeon: Hurman Horn, MD;  Location: [facility];  Service: Ophthalmology;  Laterality: Right;  . PARS PLANA VITRECTOMY Left 11/30/2014   Procedure: PARS PLANA VITRECTOMY WITH 25 GAUGE with endolaser;  Surgeon: Hurman Horn, MD;  Location: [facility];  Service: Ophthalmology;  Laterality: Left;  . TONSILLECTOMY    . TOTAL HIP ARTHROPLASTY Right 04/21/2014   dr Percell Miller  . TOTAL HIP ARTHROPLASTY Right 04/21/2014   Procedure: RIGHT TOTAL HIP ARTHROPLASTY ANTERIOR APPROACH;  Surgeon: Renette Butters, MD;  Location: [facility];  Service: Orthopedics;  Laterality: Right;    OB History   No obstetric history on file.      Home Medications    Prior to Admission medications   Medication Sig Start Date End Date Taking? Authorizing Provider  atorvastatin (LIPITOR) 10 MG tablet Take 10 mg by mouth daily.   Yes [provider]  metFORMIN (GLUCOPHAGE) 1000 MG tablet Take 1,000 mg by mouth 2 (two) times daily with a meal.   Yes [provider]  pioglitazone (ACTOS) 45 MG tablet Take 40 mg by mouth daily.   Yes [provider]  acetaminophen (TYLENOL) 500 MG tablet  Take 1,000 mg by mouth every 6 (six) hours as needed.    [provider]  b complex vitamins tablet Take 1 tablet by mouth daily.    [provider]  glipiZIDE (GLUCOTROL XL) 2.5 MG 24 hr tablet Take 10 mg by mouth 2 (two) times daily.     [provider]  Glucos-Chond-Hyal Ac-Ca Fructo (MOVE FREE JOINT HEALTH ADVANCE PO) Take 1 tablet by mouth daily.    [provider]  glucose 4 GM chewable tablet Chew 1 tablet by mouth as needed for low blood sugar.    [provider]  JENTADUETO 2.08-998 MG TABS Take 1 tablet by mouth 2 (two) times daily. 10/26/14   [provider]  lisinopril  (PRINIVIL,ZESTRIL) 40 MG tablet Take 40 mg by mouth daily.    [provider]  Multiple Vitamins-Minerals (MULTIVITAMIN PO) Take 1 tablet by mouth daily.    [provider]  Multiple Vitamins-Minerals (OCUVITE ADULT 50+ PO) Take 1 tablet by mouth daily.    [provider]  Omega 3-6-9 Fatty Acids (OMEGA 3-6-9 COMPLEX PO) Take 1 capsule by mouth 2 (two) times daily.    [provider]  sitaGLIPtin-metformin (JANUMET) 50-1000 MG per tablet Take 1 tablet by mouth 2 (two) times daily with a meal.    [provider]  UNKNOWN TO PATIENT 2 eye drop prescribed by Dr. Zadie Rhine to be used prior to surgery    [provider]    Family History Family History  Problem Relation Age of Onset  . Healthy Mother   . Healthy Father     Social History Social History   Tobacco Use  . Smoking status: Never Smoker  . Smokeless tobacco: Never Used  Substance Use Topics  . Alcohol use: No  . Drug use: No     Allergies   Anesthetics, amide and Codeine   Review of Systems Review of Systems  Constitutional: Positive for fatigue.  HENT: Positive for sinus pressure and sinus pain.   Respiratory: Negative.   Cardiovascular: Negative.   Gastrointestinal: Positive for diarrhea and vomiting.  Neurological: Negative.   All other systems reviewed and are negative.    Physical Exam Triage Vital Signs ED Triage Vitals  Enc Vitals Group     BP 04/20/19 1144 115/77     Pulse Rate 04/20/19 1144 (!) 113     Resp 04/20/19 1144 18     Temp 04/20/19 1144 99.7 F (37.6 C)     Temp Source 04/20/19 1144 Oral     SpO2 04/20/19 1144 (!) 88 %     Weight --      Height --      Head Circumference --      Peak Flow --      Pain Score 04/20/19 1148 0     Pain Loc --      Pain Edu? --      Excl. in [place]? --    No data found.  Updated Vital Signs BP 115/77 (BP Location: Right Arm)   Pulse (!) 113   Temp 99.7 F (37.6 C) (Oral)   Resp 18   SpO2 (!) 88%     Visual Acuity Right Eye Distance:   Left Eye Distance:   Bilateral Distance:    Right Eye Near:   Left Eye Near:    Bilateral Near:     Physical Exam Vitals and nursing note reviewed.  Constitutional:      General: She is not  in acute distress.    Appearance: Normal appearance. She is normal weight. She is not ill-appearing or toxic-appearing.  HENT:     Head: Normocephalic.     Right Ear: Tympanic membrane, ear canal and external ear normal. There is no impacted cerumen.     Left Ear: Tympanic membrane, ear canal and external ear normal. There is no impacted cerumen.     Nose: Nose normal. No congestion.     Mouth/Throat:     Mouth: Mucous membranes are moist.     Pharynx: Oropharynx is clear. No oropharyngeal exudate or posterior oropharyngeal erythema.  Cardiovascular:     Rate and Rhythm: Normal rate and regular rhythm.     Pulses: Normal pulses.     Heart sounds: Normal heart sounds. No murmur.  Pulmonary:     Effort: Pulmonary effort is normal. No respiratory distress.     Breath sounds: Normal breath sounds. No wheezing or rhonchi.  Chest:     Chest wall: No tenderness.  Abdominal:     General: Abdomen is flat. Bowel sounds are normal. There is no distension.     Palpations: There is no mass.     Tenderness: There is no abdominal tenderness.  Skin:    Capillary Refill: Capillary refill takes less than 2 seconds.  Neurological:     General: No focal deficit present.     Mental Status: She is alert and oriented to person, place, and time.      UC Treatments / Results  Labs (all labs ordered are listed, but only abnormal results are displayed) Labs Reviewed  POCT FASTING CBG [facility] MANUAL ENTRY - Abnormal; Notable for the following components:      Result Value   POCT Glucose ([facility]) 265 (*)    All other components within normal limits  POC SARS CORONAVIRUS 2 AG -  ED - Abnormal; Notable for the following components:   SARS Coronavirus 2 Ag Positive (*)    All  other components within normal limits    EKG   Radiology No results found.  Procedures Procedures (including critical care time)  Medications Ordered in UC Medications - No data to display  Initial Impression / Assessment and Plan / UC Course  I have reviewed the triage vital signs and the nursing notes.  Pertinent labs & imaging results that were available during my care of the patient were reviewed by me and considered in my medical decision making (see chart for details).   Point-of-care COVID-19 test was positive Patient was advised to go to ED due to low oxygen saturation, increased heart rate and generalized weakness.  Final Clinical Impressions(s) / UC Diagnoses   Final diagnoses:  [id number] virus infection     Discharge Instructions     Your point-of-care COVID-19 test was positive Patient was advised to go to ED due to low oxygen saturation and increased heart rate and weakness.    ED Prescriptions    None     PDMP not reviewed this encounter.   Emerson Monte, [place] 04/20/19 1219

## 2019-04-20 NOTE — ED Triage Notes (Signed)
Pt presents to UC w/ c/o sinus symptoms 11 days ago. Pt states sinus symptoms have improved. Pt states yesterday she started to have dry mouth, feeling weak, diarrhea, and one instance of vomiting.

## 2019-04-20 NOTE — ED Provider Notes (Signed)
[facility] EMERGENCY DEPARTMENT Provider Note   CSN: [id number] Arrival date & time: 04/20/19  1236     History Chief Complaint  Patient presents with  . Fatigue    [name] is a 64 y.o. female.  Patient complains of weakness and fever for a number day with nausea.  She was seen at the urgent care today and had a positive Covid test  The history is provided by the patient. No language interpreter was used.  Fever Temp source:  Oral Severity:  Moderate Onset quality:  Sudden Timing:  Constant Progression:  Waxing and waning Chronicity:  New Relieved by:  Nothing Worsened by:  Nothing Ineffective treatments:  None tried Associated symptoms: vomiting   Associated symptoms: no chest pain, no congestion, no cough, no diarrhea, no headaches and no rash        Past Medical History:  Diagnosis Date  . Arthritis    OSTEO  . Diabetes mellitus without complication ([place])    TYPE 2  . Family history of adverse reaction to anesthesia    Mother - N/V  . Goiter   . Hypertension   . PONV (postoperative nausea and vomiting)   . Retinopathy     Patient Active Problem List   Diagnosis Date Noted  . DJD (degenerative joint disease) 04/21/2014    Past Surgical History:  Procedure Laterality Date  . ABDOMINAL HYSTERECTOMY    . AIR/FLUID EXCHANGE Left 11/30/2014   Procedure: AIR/FLUID EXCHANGE LEFT EYE;  Surgeon: Hurman Horn, MD;  Location: [place];  Service: Ophthalmology;  Laterality: Left;  . BACK SURGERY    . CESAREAN SECTION     x 2  . EYE SURGERY    . INJECTION OF SILICONE OIL Right [id number]   Procedure: INJECTION OF SILICONE OIL;  Surgeon: Hurman Horn, MD;  Location: [place];  Service: Ophthalmology;  Laterality: Right;  . INJECTION OF SILICONE OIL Left [id number]   Procedure: INJECTION OF SILICONE OIL;  Surgeon: Hurman Horn, MD;  Location: [place];  Service: Ophthalmology;  Laterality: Left;  . LASER PHOTO ABLATION Right 05/26/2014   Procedure: LASER PHOTO  ABLATION;  Surgeon: Hurman Horn, MD;  Location: [place];  Service: Ophthalmology;  Laterality: Right;  . PARS PLANA VITRECTOMY Right 05/26/2014   Procedure: PARS PLANA VITRECTOMY 25 GAUGE;  Surgeon: Hurman Horn, MD;  Location: [place];  Service: Ophthalmology;  Laterality: Right;  . PARS PLANA VITRECTOMY Left 11/30/2014   Procedure: PARS PLANA VITRECTOMY WITH 25 GAUGE with endolaser;  Surgeon: Hurman Horn, MD;  Location: [place];  Service: Ophthalmology;  Laterality: Left;  . TONSILLECTOMY    . TOTAL HIP ARTHROPLASTY Right 04/21/2014   dr Percell Miller  . TOTAL HIP ARTHROPLASTY Right 04/21/2014   Procedure: RIGHT TOTAL HIP ARTHROPLASTY ANTERIOR APPROACH;  Surgeon: Renette Butters, MD;  Location: [place];  Service: Orthopedics;  Laterality: Right;     OB History   No obstetric history on file.     Family History  Problem Relation Age of Onset  . Healthy Mother   . Healthy Father     Social History   Tobacco Use  . Smoking status: Never Smoker  . Smokeless tobacco: Never Used  Substance Use Topics  . Alcohol use: No  . Drug use: No    Home Medications Prior to Admission medications   Medication Sig Start Date End Date Taking? Authorizing Provider  acetaminophen (TYLENOL) 500 MG tablet Take 1,000 mg by mouth  every 6 (six) hours as needed.    [provider]  albuterol (VENTOLIN HFA) 108 (90 Base) MCG/ACT inhaler Inhale 2 puffs into the lungs every 4 (four) hours as needed for wheezing or shortness of breath. 04/20/19   Milton Ferguson, MD  atorvastatin (LIPITOR) 10 MG tablet Take 10 mg by mouth daily.    [provider]  b complex vitamins tablet Take 1 tablet by mouth daily.    [provider]  glipiZIDE (GLUCOTROL XL) 2.5 MG 24 hr tablet Take 10 mg by mouth 2 (two) times daily.     [provider]  Glucos-Chond-Hyal Ac-Ca Fructo (MOVE FREE JOINT HEALTH ADVANCE PO) Take 1 tablet by mouth daily.    [provider]  glucose 4 GM chewable tablet  Chew 1 tablet by mouth as needed for low blood sugar.    [provider]  JENTADUETO 2.08-998 MG TABS Take 1 tablet by mouth 2 (two) times daily. 10/26/14   [provider]  lisinopril (PRINIVIL,ZESTRIL) 40 MG tablet Take 40 mg by mouth daily.    [provider]  metFORMIN (GLUCOPHAGE) 1000 MG tablet Take 1,000 mg by mouth 2 (two) times daily with a meal.    [provider]  Multiple Vitamins-Minerals (MULTIVITAMIN PO) Take 1 tablet by mouth daily.    [provider]  Multiple Vitamins-Minerals (OCUVITE ADULT 50+ PO) Take 1 tablet by mouth daily.    [provider]  Omega 3-6-9 Fatty Acids (OMEGA 3-6-9 COMPLEX PO) Take 1 capsule by mouth 2 (two) times daily.    [provider]  ondansetron (ZOFRAN ODT) 4 MG disintegrating tablet 4mg  ODT q4 hours prn nausea/vomit 04/20/19   Milton Ferguson, MD  pioglitazone (ACTOS) 45 MG tablet Take 40 mg by mouth daily.    [provider]  sitaGLIPtin-metformin (JANUMET) 50-1000 MG per tablet Take 1 tablet by mouth 2 (two) times daily with a meal.    [provider]  UNKNOWN TO PATIENT 2 eye drop prescribed by Dr. Zadie Rhine to be used prior to surgery    [provider]    Allergies    Anesthetics, amide and Codeine  Review of Systems   Review of Systems  Constitutional: Positive for fever. Negative for appetite change and fatigue.  HENT: Negative for congestion, ear discharge and sinus pressure.   Eyes: Negative for discharge.  Respiratory: Negative for cough.   Cardiovascular: Negative for chest pain.  Gastrointestinal: Positive for vomiting. Negative for abdominal pain and diarrhea.  Genitourinary: Negative for frequency and hematuria.  Musculoskeletal: Negative for back pain.  Skin: Negative for rash.  Neurological: Negative for seizures and headaches.  Psychiatric/Behavioral: Negative for hallucinations.    Physical Exam Updated Vital Signs BP 129/68   Pulse (!)  103   Temp 99.3 F (37.4 C)   Resp 20   Ht 5\' 6"  (1.676 m)   Wt 88.5 kg   SpO2 94%   BMI 31.47 kg/m   Physical Exam Vitals and nursing note reviewed.  Constitutional:      Appearance: She is well-developed.  HENT:     Head: Normocephalic.     Mouth/Throat:     Comments: Dry mucous membrane Eyes:     General: No scleral icterus.    Conjunctiva/sclera: Conjunctivae normal.  Neck:     Thyroid: No thyromegaly.  Cardiovascular:     Rate and Rhythm: Normal rate and regular rhythm.     Heart sounds: No murmur. No friction rub. No gallop.  Pulmonary:     Breath sounds: No stridor. No wheezing or rales.  Chest:     Chest wall: No tenderness.  Abdominal:     General: There is no distension.     Tenderness: There is no abdominal tenderness. There is no rebound.  Musculoskeletal:        General: Normal range of motion.     Cervical back: Neck supple.  Lymphadenopathy:     Cervical: No cervical adenopathy.  Skin:    Findings: No erythema or rash.  Neurological:     Mental Status: She is alert and oriented to person, place, and time.     Motor: No abnormal muscle tone.     Coordination: Coordination normal.  Psychiatric:        Behavior: Behavior normal.     ED Results / Procedures / Treatments   Labs (all labs ordered are listed, but only abnormal results are displayed) Labs Reviewed  CBC WITH DIFFERENTIAL/PLATELET - Abnormal; Notable for the following components:      Result Value   Lymphs Abs 0.5 (*)    All other components within normal limits  COMPREHENSIVE METABOLIC PANEL - Abnormal; Notable for the following components:   Sodium 132 (*)    Chloride 95 (*)    Glucose, Bld 312 (*)    Albumin 3.2 (*)    AST 53 (*)    ALT 47 (*)    All other components within normal limits    EKG None  Radiology DG Chest Portable 1 View  Result Date: 04/20/2019 CLINICAL DATA:  64 year old female with history of diarrhea and vomiting yesterday. Chills. Weakness. Tested  positive for COVID-19. EXAM: PORTABLE CHEST 1 VIEW COMPARISON:  No priors. FINDINGS: Patchy subtle ill-defined opacities and areas of interstitial prominence noted throughout the lungs bilaterally. No pneumothorax. No pleural effusions. No evidence of pulmonary edema. Heart size is normal. Upper mediastinal contours are within normal limits. Aortic atherosclerosis. Old healed bilateral rib fractures. IMPRESSION: 1. The appearance the chest is concerning for developing bilateral multilobar pneumonia, as can be seen in the setting of viral infection with COVID-19. 2. Aortic atherosclerosis. Electronically Signed   By: Vinnie Langton M.D.   On: 04/20/2019 14:15    Procedures Procedures (including critical care time)  Medications Ordered in ED Medications  albuterol (VENTOLIN HFA) 108 (90 Base) MCG/ACT inhaler 4 puff (4 puffs Inhalation Given 04/20/19 1455)  sodium chloride 0.9 % bolus 1,000 mL (0 mLs Intravenous Stopped 04/20/19 1454)    ED Course  I have reviewed the triage vital signs and the nursing notes.  Pertinent labs & imaging results that were available during my care of the patient were reviewed by me and considered in my medical decision making (see chart for details).    MDM Rules/Calculators/A&P                     Patient with hyperglycemia dehydration.  Patient is nontoxic.  She was given a liter of fluids.  She will be discharged home with nausea medicine and albuterol.  Zacarias Pontes has been contacted and given her phone number for possible monoclonal antibody treatment Final Clinical Impression(s) / ED Diagnoses Final diagnoses:  [id number]    Rx / DC Orders ED Discharge Orders         Ordered    ondansetron (ZOFRAN ODT) 4 MG disintegrating tablet     04/20/19 1457    albuterol (VENTOLIN HFA) 108 (90 Base) MCG/ACT inhaler  Every 4  hours PRN     04/20/19 1457           Milton Ferguson, MD 04/20/19 1500

## 2019-04-20 NOTE — Telephone Encounter (Signed)
Received referral for patient regarding consideration of monoclonal antibody treatment for her positive COVID 19 test that was completed today.  Date Tested: 04/20/2019  Date of Symptom onset: 04/09/2019   Symptoms: congestion, weakness, muscle aches/fatigue      [name] is unfortunately not eligible for treatment, as she is no longer in the 10 day window of her symptom onset.  I reviewed with her that we have no data that the infusion could be helpful to her this late, and may actually make her feel worse.  She understands the above and voiced appreciation for the call.  Reviewed supportive care and ER precautions.   Wilber Bihari, NP

## 2019-04-20 NOTE — Discharge Instructions (Addendum)
Drink plenty of fluids and rest.  Take Tylenol or Motrin and follow-up with your family doctor later this week.  Your phone number has been given to [facility] and they should call about possible treatment of an antibody if you qualify

## 2019-04-21 ENCOUNTER — Telehealth: Payer: Self-pay | Admitting: Nurse Practitioner

## 2019-04-21 NOTE — Telephone Encounter (Signed)
Called to Discuss with patient about Covid symptoms and the use of bamlanivimab, a monoclonal antibody infusion for those with mild to moderate Covid symptoms and at a high risk of hospitalization.     Patient states that symptoms started more than 10 days ago and therefore would not meet criteria for infusion.

## 2020-01-13 DIAGNOSIS — E012 Iodine-deficiency related (endemic) goiter, unspecified: Secondary | ICD-10-CM | POA: Diagnosis not present

## 2020-01-13 DIAGNOSIS — H3500 Unspecified background retinopathy: Secondary | ICD-10-CM | POA: Diagnosis not present

## 2020-01-13 DIAGNOSIS — E782 Mixed hyperlipidemia: Secondary | ICD-10-CM | POA: Diagnosis not present

## 2020-01-13 DIAGNOSIS — I10 Essential (primary) hypertension: Secondary | ICD-10-CM | POA: Diagnosis not present

## 2020-01-13 DIAGNOSIS — E11319 Type 2 diabetes mellitus with unspecified diabetic retinopathy without macular edema: Secondary | ICD-10-CM | POA: Diagnosis not present

## 2020-01-16 DIAGNOSIS — H3500 Unspecified background retinopathy: Secondary | ICD-10-CM | POA: Diagnosis not present

## 2020-01-16 DIAGNOSIS — E782 Mixed hyperlipidemia: Secondary | ICD-10-CM | POA: Diagnosis not present

## 2020-01-16 DIAGNOSIS — I10 Essential (primary) hypertension: Secondary | ICD-10-CM | POA: Diagnosis not present

## 2020-01-16 DIAGNOSIS — E11319 Type 2 diabetes mellitus with unspecified diabetic retinopathy without macular edema: Secondary | ICD-10-CM | POA: Diagnosis not present

## 2020-01-16 DIAGNOSIS — E048 Other specified nontoxic goiter: Secondary | ICD-10-CM | POA: Diagnosis not present

## 2020-03-09 DIAGNOSIS — S91102A Unspecified open wound of left great toe without damage to nail, initial encounter: Secondary | ICD-10-CM | POA: Diagnosis not present

## 2020-03-16 ENCOUNTER — Other Ambulatory Visit: Payer: Self-pay

## 2020-03-16 DIAGNOSIS — S91109A Unspecified open wound of unspecified toe(s) without damage to nail, initial encounter: Secondary | ICD-10-CM

## 2020-03-30 ENCOUNTER — Other Ambulatory Visit: Payer: Self-pay

## 2020-03-30 ENCOUNTER — Ambulatory Visit (HOSPITAL_COMMUNITY)
Admission: RE | Admit: 2020-03-30 | Discharge: 2020-03-30 | Disposition: A | Discharge status: Home / Self Care | Payer: Medicare Other | Source: Ambulatory Visit | Attending: Vascular Surgery | Admitting: Vascular Surgery

## 2020-03-30 ENCOUNTER — Encounter: Payer: Self-pay | Admitting: Vascular Surgery

## 2020-03-30 ENCOUNTER — Ambulatory Visit (INDEPENDENT_AMBULATORY_CARE_PROVIDER_SITE_OTHER): Payer: Medicare Other | Admitting: Vascular Surgery

## 2020-03-30 ENCOUNTER — Other Ambulatory Visit (HOSPITAL_COMMUNITY)
Admission: RE | Admit: 2020-03-30 | Discharge: 2020-03-30 | Disposition: A | Discharge status: Home / Self Care | Payer: Medicare Other | Source: Ambulatory Visit | Attending: Vascular Surgery | Admitting: Vascular Surgery

## 2020-03-30 DIAGNOSIS — I70249 Atherosclerosis of native arteries of left leg with ulceration of unspecified site: Secondary | ICD-10-CM | POA: Insufficient documentation

## 2020-03-30 DIAGNOSIS — E119 Type 2 diabetes mellitus without complications: Secondary | ICD-10-CM | POA: Diagnosis not present

## 2020-03-30 DIAGNOSIS — I10 Essential (primary) hypertension: Secondary | ICD-10-CM | POA: Diagnosis not present

## 2020-03-30 DIAGNOSIS — Z20822 Contact with and (suspected) exposure to covid-19: Secondary | ICD-10-CM | POA: Insufficient documentation

## 2020-03-30 DIAGNOSIS — S91109A Unspecified open wound of unspecified toe(s) without damage to nail, initial encounter: Secondary | ICD-10-CM | POA: Diagnosis not present

## 2020-03-30 DIAGNOSIS — Z01812 Encounter for preprocedural laboratory examination: Secondary | ICD-10-CM | POA: Insufficient documentation

## 2020-03-30 DIAGNOSIS — I70245 Atherosclerosis of native arteries of left leg with ulceration of other part of foot: Secondary | ICD-10-CM

## 2020-03-30 DIAGNOSIS — X58XXXA Exposure to other specified factors, initial encounter: Secondary | ICD-10-CM | POA: Insufficient documentation

## 2020-03-30 NOTE — Progress Notes (Signed)
ASSESSMENT & PLAN:  64 y.o. female with atherosclerosis of native arteries of left lower extremity with great toe ulceration.  Recommend:  Complete cessation from all tobacco products. Blood glucose control with goal A1c < 7%. Blood pressure control with goal blood pressure < 140/90 mmHg. Lipid reduction therapy with goal LDL-C <100 mg/dL (<66 if symptomatic from PAD).  Aspirin 81mg  PO QD.  Atorvastatin 40-80mg  PO QD (or other "high intensity" statin therapy).  For LLE angiogram via RCFA access 03/31/20.  CHIEF COMPLAINT:   Left great toe ulceration  HISTORY:  HISTORY OF PRESENT ILLNESS: [name] is a 64 y.o. female referred to clinic for evaluation of left great toe ulceration.  This began in the beginning of December.  His progress.  The foot is not painful.  She reports a several year history of numbness described to lumbar stenosis.  She reports no burning or electric discomfort consistent with diabetic neuropathy.  She is ambulatory.  She can walk as far she wants without pain.  She has no symptoms consistent with ischemic rest pain.  She reports history of ulceration on her right fifth toe which healed spontaneously, but took greater than 2 months to heal.  Past Medical History:  Diagnosis Date  . Arthritis    OSTEO  . Diabetes mellitus without complication ([facility])    TYPE 2  . Family history of adverse reaction to anesthesia    Mother - N/V  . Goiter   . Hyperlipidemia   . Hypertension   . PONV (postoperative nausea and vomiting)   . Retinopathy     Past Surgical History:  Procedure Laterality Date  . ABDOMINAL HYSTERECTOMY    . AIR/FLUID EXCHANGE Left 11/30/2014   Procedure: AIR/FLUID EXCHANGE LEFT EYE;  Surgeon: 12/02/2014, MD;  Location: [facility] OR;  Service: Ophthalmology;  Laterality: Left;  . BACK SURGERY    . CESAREAN SECTION     x 2  . EYE SURGERY    . INJECTION OF SILICONE OIL Right 05/26/2014   Procedure: INJECTION OF SILICONE OIL;  Surgeon: 05/28/2014, MD;  Location: [facility] OR;  Service: Ophthalmology;  Laterality: Right;  . INJECTION OF SILICONE OIL Left 11/30/2014   Procedure: INJECTION OF SILICONE OIL;  Surgeon: 12/02/2014, MD;  Location: [facility] OR;  Service: Ophthalmology;  Laterality: Left;  . LASER PHOTO ABLATION Right 05/26/2014   Procedure: LASER PHOTO ABLATION;  Surgeon: 05/28/2014, MD;  Location: [facility] OR;  Service: Ophthalmology;  Laterality: Right;  . PARS PLANA VITRECTOMY Right 05/26/2014   Procedure: PARS PLANA VITRECTOMY 25 GAUGE;  Surgeon: 05/28/2014, MD;  Location: [facility] OR;  Service: Ophthalmology;  Laterality: Right;  . PARS PLANA VITRECTOMY Left 11/30/2014   Procedure: PARS PLANA VITRECTOMY WITH 25 GAUGE with endolaser;  Surgeon: 12/02/2014, MD;  Location: [place] OR;  Service: Ophthalmology;  Laterality: Left;  . TONSILLECTOMY    . TOTAL HIP ARTHROPLASTY Right 04/21/2014   dr 04/23/2014  . TOTAL HIP ARTHROPLASTY Right 04/21/2014   Procedure: RIGHT TOTAL HIP ARTHROPLASTY ANTERIOR APPROACH;  Surgeon: 04/23/2014, MD;  Location: [facility] OR;  Service: Orthopedics;  Laterality: Right;    Family History  Problem Relation Age of Onset  . Healthy Mother   . Healthy Father     Social History   Socioeconomic History  . Marital status: Married    Spouse name: Not on file  . Number of children: Not on file  . Years of  education: Not on file  . Highest education level: Not on file  Occupational History  . Not on file  Tobacco Use  . Smoking status: Never Smoker  . Smokeless tobacco: Never Used  Vaping Use  . Vaping Use: Never used  Substance and Sexual Activity  . Alcohol use: No  . Drug use: No  . Sexual activity: Not on file  Other Topics Concern  . Not on file  Social History Narrative  . Not on file   Social Determinants of Health   Financial Resource Strain: Not on file  Food Insecurity: Not on file  Transportation Needs: Not on file  Physical Activity: Not on file  Stress: Not on file  Social  Connections: Not on file  Intimate Partner Violence: Not on file    Allergies  Allergen Reactions  . Anesthetics, Amide Nausea And Vomiting  . Codeine Nausea And Vomiting    Current Outpatient Medications  Medication Sig Dispense Refill  . acetaminophen (TYLENOL) 500 MG tablet Take 1,000 mg by mouth every 6 (six) hours as needed.    Marland Kitchen atorvastatin (LIPITOR) 10 MG tablet Take 10 mg by mouth daily.    Marland Kitchen b complex vitamins tablet Take 1 tablet by mouth daily.    Marland Kitchen glipiZIDE (GLUCOTROL) 10 MG tablet Take 10 mg by mouth 2 (two) times daily.    . Glucos-Chond-Hyal Ac-Ca Fructo (MOVE FREE JOINT HEALTH ADVANCE PO) Take 1 tablet by mouth daily.    Marland Kitchen lisinopril-hydrochlorothiazide (ZESTORETIC) 20-12.5 MG tablet Take 2 tablets by mouth daily.    . Multiple Vitamins-Minerals (MULTIVITAMIN PO) Take 1 tablet by mouth daily.    . Multiple Vitamins-Minerals (OCUVITE ADULT 50+ PO) Take 1 tablet by mouth daily.    . Omega 3-6-9 Fatty Acids (OMEGA 3-6-9 COMPLEX PO) Take 1 capsule by mouth 2 (two) times daily.    . sitaGLIPtin-metformin (JANUMET) 50-1000 MG per tablet Take 1 tablet by mouth 2 (two) times daily with a meal.    . glucose 4 GM chewable tablet Chew 1 tablet by mouth as needed for low blood sugar. (Patient not taking: Reported on 03/30/2020)     No current facility-administered medications for this visit.    REVIEW OF SYSTEMS:  [X]  denotes positive finding, [ ]  denotes negative finding Cardiac  Comments:  Chest pain or chest pressure:    Shortness of breath upon exertion:    Short of breath when lying flat:    Irregular heart rhythm:        Vascular    Pain in calf, thigh, or hip brought on by ambulation:    Pain in feet at night that wakes you up from your sleep:     Blood clot in your veins:    Leg swelling:         Pulmonary    Oxygen at home:    Productive cough:     Wheezing:         Neurologic    Sudden weakness in arms or legs:     Sudden numbness in arms or legs:      Sudden onset of difficulty speaking or slurred speech:    Temporary loss of vision in one eye:     Problems with dizziness:         Gastrointestinal    Blood in stool:     Vomited blood:         Genitourinary    Burning when urinating:     Blood in urine:  Psychiatric    Major depression:         Hematologic    Bleeding problems:    Problems with blood clotting too easily:        Skin    Rashes or ulcers:        Constitutional    Fever or chills:     PHYSICAL EXAM:   Vitals:   03/30/20 1023  BP: (!) 178/85  Pulse: 100  Resp: 20  Temp: 98.2 F (36.8 C)  Weight: 191 lb (86.6 kg)  Height: 5\' 6"  (1.676 m)   Constitutional: Well appearing in no distress. Appears well nourished.  Neurologic: Normal gait and station. CN intact. No weakness. No sensory loss. Psychiatric: Mood and affect symmetric and appropriate. Eyes: No icterus. No conjunctival pallor. Ears, nose, throat: mucous membranes moist. Midline trachea. Cardiac: regular rate and rhythm.  Respiratory: unlabored. Abdominal: soft, non-tender, non-distended. No palpable pulsatile abdominal mass. Peripheral vascular:  Femoral pulse: L 1+ / R 1+  No palpable pedal pulses  Extremity: No edema. No cyanosis. No pallor.  Skin: No gangrene. + ulceration of left distal great toe.  Lymphatic: No Stemmer's sign. No palpable lymphadenopathy.  DATA REVIEW:    Most recent CBC CBC Latest Ref Rng & Units 04/20/2019 11/30/2014 05/26/2014  WBC 4.0 - 10.5 K/uL 6.3 8.0 6.6  Hemoglobin 12.0 - 15.0 g/dL 13.6 13.6 10.4(L)  Hematocrit 36.0 - 46.0 % 41.8 41.3 33.0(L)  Platelets 150 - 400 K/uL 226 235 277     Most recent CMP CMP Latest Ref Rng & Units 04/20/2019 11/30/2014 05/26/2014  Glucose 70 - 99 mg/dL 312(H) 120(H) 123(H)  BUN 8 - 23 mg/dL 14 18 24(H)  Creatinine 0.44 - 1.00 mg/dL 0.90 0.70 0.69  Sodium 135 - 145 mmol/L 132(L) 141 139  Potassium 3.5 - 5.1 mmol/L 4.3 4.3 4.1  Chloride 98 - 111 mmol/L 95(L) 109 107   CO2 22 - 32 mmol/L 24 24 26   Calcium 8.9 - 10.3 mg/dL 8.9 9.4 9.5  Total Protein 6.5 - 8.1 g/dL 6.8 - -  Total Bilirubin 0.3 - 1.2 mg/dL 0.8 - -  Alkaline Phos 38 - 126 U/L 59 - -  AST 15 - 41 U/L 53(H) - -  ALT 0 - 44 U/L 47(H) - -     ABI 03/30/20   Yevonne Aline. Stanford Breed, MD [facility] Phone Number: [phone] 03/30/2020 10:33 AM

## 2020-03-31 ENCOUNTER — Ambulatory Visit (HOSPITAL_COMMUNITY)
Admission: RE | Admit: 2020-03-31 | Discharge: 2020-03-31 | Disposition: A | Discharge status: Home / Self Care | Payer: Medicare Other | Attending: Vascular Surgery | Admitting: Vascular Surgery

## 2020-03-31 ENCOUNTER — Other Ambulatory Visit (HOSPITAL_COMMUNITY): Payer: Self-pay | Admitting: Vascular Surgery

## 2020-03-31 ENCOUNTER — Other Ambulatory Visit: Payer: Self-pay

## 2020-03-31 ENCOUNTER — Encounter (HOSPITAL_COMMUNITY)
Admission: RE | Disposition: A | Discharge status: Home / Self Care | Payer: Self-pay | Source: Home / Self Care | Attending: Vascular Surgery

## 2020-03-31 DIAGNOSIS — I70249 Atherosclerosis of native arteries of left leg with ulceration of unspecified site: Secondary | ICD-10-CM | POA: Diagnosis not present

## 2020-03-31 DIAGNOSIS — I70245 Atherosclerosis of native arteries of left leg with ulceration of other part of foot: Secondary | ICD-10-CM | POA: Diagnosis not present

## 2020-03-31 DIAGNOSIS — Z7984 Long term (current) use of oral hypoglycemic drugs: Secondary | ICD-10-CM | POA: Insufficient documentation

## 2020-03-31 DIAGNOSIS — E1151 Type 2 diabetes mellitus with diabetic peripheral angiopathy without gangrene: Secondary | ICD-10-CM | POA: Insufficient documentation

## 2020-03-31 DIAGNOSIS — L97529 Non-pressure chronic ulcer of other part of left foot with unspecified severity: Secondary | ICD-10-CM | POA: Insufficient documentation

## 2020-03-31 DIAGNOSIS — Z79899 Other long term (current) drug therapy: Secondary | ICD-10-CM | POA: Insufficient documentation

## 2020-03-31 DIAGNOSIS — E11621 Type 2 diabetes mellitus with foot ulcer: Secondary | ICD-10-CM | POA: Insufficient documentation

## 2020-03-31 HISTORY — PX: PERIPHERAL VASCULAR INTERVENTION: CATH118257

## 2020-03-31 HISTORY — PX: ABDOMINAL AORTOGRAM W/LOWER EXTREMITY: CATH118223

## 2020-03-31 LAB — GLUCOSE, CAPILLARY
Glucose-Capillary: 191 mg/dL — ABNORMAL HIGH (ref 70–99)
Glucose-Capillary: 116 mg/dL — ABNORMAL HIGH (ref 70–99)

## 2020-03-31 LAB — POCT I-STAT, CHEM 8
BUN: 22 mg/dL (ref 8–23)
Calcium, Ion: 1.23 mmol/L (ref 1.15–1.40)
Chloride: 105 mmol/L (ref 98–111)
Creatinine, Ser: 0.6 mg/dL (ref 0.44–1.00)
Glucose, Bld: 201 mg/dL — ABNORMAL HIGH (ref 70–99)
HCT: 42 % (ref 36.0–46.0)
Hemoglobin: 14.3 g/dL (ref 12.0–15.0)
Potassium: 4 mmol/L (ref 3.5–5.1)
Sodium: 139 mmol/L (ref 135–145)
TCO2: 24 mmol/L (ref 22–32)

## 2020-03-31 LAB — SARS CORONAVIRUS 2 (TAT 6-24 HRS): SARS Coronavirus 2: NEGATIVE

## 2020-03-31 SURGERY — ABDOMINAL AORTOGRAM W/LOWER EXTREMITY
Anesthesia: LOCAL

## 2020-03-31 MED ORDER — LIDOCAINE HCL (PF) 1 % IJ SOLN
INTRAMUSCULAR | Status: DC | PRN
  Administered 2020-03-31: 15 mL via INTRADERMAL

## 2020-03-31 MED ORDER — HYDRALAZINE HCL 20 MG/ML IJ SOLN
5.0000 mg | INTRAMUSCULAR | Status: DC | PRN
Start: 2020-03-31 — End: 2020-03-31

## 2020-03-31 MED ORDER — ONDANSETRON HCL 4 MG/2ML IJ SOLN: 4.0000 mg | Freq: Four times a day (QID) | INTRAMUSCULAR | Status: DC | PRN

## 2020-03-31 MED ORDER — FENTANYL CITRATE (PF) 100 MCG/2ML IJ SOLN
INTRAMUSCULAR | Status: DC | PRN
  Administered 2020-03-31: 50 ug via INTRAVENOUS

## 2020-03-31 MED ORDER — ACETAMINOPHEN 325 MG PO TABS: 650.0000 mg | ORAL_TABLET | ORAL | Status: DC | PRN

## 2020-03-31 MED ORDER — LABETALOL HCL 5 MG/ML IV SOLN: 10.0000 mg | INTRAVENOUS | Status: DC | PRN

## 2020-03-31 MED ORDER — ONDANSETRON HCL 4 MG/2ML IJ SOLN
INTRAMUSCULAR | Status: AC
  Filled 2020-03-31: qty 2

## 2020-03-31 MED ORDER — SODIUM CHLORIDE 0.9 % WEIGHT BASED INFUSION: 1.0000 mL/kg/h | INTRAVENOUS | Status: DC

## 2020-03-31 MED ORDER — HEPARIN (PORCINE) IN NACL 1000-0.9 UT/500ML-% IV SOLN
INTRAVENOUS | Status: DC | PRN
  Administered 2020-03-31 (×2): 500 mL

## 2020-03-31 MED ORDER — SODIUM CHLORIDE 0.9% FLUSH: 3.0000 mL | INTRAVENOUS | Status: DC | PRN

## 2020-03-31 MED ORDER — SODIUM CHLORIDE 0.9 % IV SOLN: INTRAVENOUS | Status: DC

## 2020-03-31 MED ORDER — HEPARIN SODIUM (PORCINE) 1000 UNIT/ML IJ SOLN
INTRAMUSCULAR | Status: AC
  Filled 2020-03-31: qty 1

## 2020-03-31 MED ORDER — CLOPIDOGREL BISULFATE 75 MG PO TABS: 75.0000 mg | ORAL_TABLET | Freq: Every day | ORAL | 11 refills | Status: AC

## 2020-03-31 MED ORDER — FENTANYL CITRATE (PF) 100 MCG/2ML IJ SOLN
INTRAMUSCULAR | Status: AC
  Filled 2020-03-31: qty 2

## 2020-03-31 MED ORDER — HEPARIN SODIUM (PORCINE) 1000 UNIT/ML IJ SOLN
INTRAMUSCULAR | Status: DC | PRN
  Administered 2020-03-31: 9000 [IU] via INTRAVENOUS

## 2020-03-31 MED ORDER — SODIUM CHLORIDE 0.9 % IV SOLN: 250.0000 mL | INTRAVENOUS | Status: DC | PRN

## 2020-03-31 MED ORDER — ASPIRIN EC 81 MG PO TBEC: 81.0000 mg | DELAYED_RELEASE_TABLET | Freq: Every day | ORAL | 2 refills | Status: AC

## 2020-03-31 MED ORDER — ASPIRIN EC 81 MG PO TBEC: 81.0000 mg | DELAYED_RELEASE_TABLET | Freq: Every day | ORAL | Status: DC

## 2020-03-31 MED ORDER — MIDAZOLAM HCL 2 MG/2ML IJ SOLN
INTRAMUSCULAR | Status: DC | PRN
  Administered 2020-03-31: 1 mg via INTRAVENOUS

## 2020-03-31 MED ORDER — CLOPIDOGREL BISULFATE 300 MG PO TABS
ORAL_TABLET | ORAL | Status: AC
  Filled 2020-03-31: qty 1

## 2020-03-31 MED ORDER — IODIXANOL 320 MG/ML IV SOLN
INTRAVENOUS | Status: DC | PRN
  Administered 2020-03-31: 15:00:00 165 mL via INTRA_ARTERIAL

## 2020-03-31 MED ORDER — CLOPIDOGREL BISULFATE 75 MG PO TABS: 300.0000 mg | ORAL_TABLET | Freq: Once | ORAL | Status: DC

## 2020-03-31 MED ORDER — MIDAZOLAM HCL 2 MG/2ML IJ SOLN
INTRAMUSCULAR | Status: AC
  Filled 2020-03-31: qty 2

## 2020-03-31 MED ORDER — CLOPIDOGREL BISULFATE 300 MG PO TABS
ORAL_TABLET | ORAL | Status: DC | PRN
  Administered 2020-03-31: 300 mg via ORAL

## 2020-03-31 MED ORDER — CLOPIDOGREL BISULFATE 75 MG PO TABS: 75.0000 mg | ORAL_TABLET | Freq: Every day | ORAL | Status: DC

## 2020-03-31 MED ORDER — HEPARIN (PORCINE) IN NACL 1000-0.9 UT/500ML-% IV SOLN
INTRAVENOUS | Status: AC
  Filled 2020-03-31: qty 1000

## 2020-03-31 MED ORDER — SODIUM CHLORIDE 0.9% FLUSH: 3.0000 mL | Freq: Two times a day (BID) | INTRAVENOUS | Status: DC

## 2020-03-31 MED ORDER — LIDOCAINE HCL (PF) 1 % IJ SOLN
INTRAMUSCULAR | Status: AC
  Filled 2020-03-31: qty 30

## 2020-03-31 MED ORDER — ONDANSETRON HCL 4 MG/2ML IJ SOLN
INTRAMUSCULAR | Status: DC | PRN
  Administered 2020-03-31: 4 mg via INTRAVENOUS

## 2020-03-31 MED FILL — CLOPIDOGREL 75 MG TABLET: 75 | 30 days supply | Qty: 30 | Fill #0

## 2020-03-31 MED FILL — ASPIRIN LOW DOSE 81 MG TBEC: 81 | 90 days supply | Qty: 90 | Fill #0

## 2020-03-31 SURGICAL SUPPLY — 22 items
BALLN MUSTANG 4X40X135 (BALLOONS) ×3
BALLOON MUSTANG 4X40X135 (BALLOONS) ×2 IMPLANT
CATH OMNI FLUSH 5F 65CM (CATHETERS) ×3 IMPLANT
CLOSURE PERCLOSE PROSTYLE (VASCULAR PRODUCTS) ×3 IMPLANT
DCB RANGER 4.0X150 150 (BALLOONS) ×4 IMPLANT
DEVICE CLOSURE MYNXGRIP 6/7F (Vascular Products) ×3 IMPLANT
DEVICE TORQUE .025-.038 (MISCELLANEOUS) ×3 IMPLANT
DEVICE TORQUE H2O (MISCELLANEOUS) ×3 IMPLANT
GLIDEWIRE ADV .035X260CM (WIRE) ×3 IMPLANT
KIT ENCORE 26 ADVANTAGE (KITS) ×3 IMPLANT
KIT MICROPUNCTURE NIT STIFF (SHEATH) ×3 IMPLANT
KIT PV (KITS) ×3 IMPLANT
RANGER DCB 4.0X150 150 (BALLOONS) ×6
SHEATH PINNACLE 5F 10CM (SHEATH) ×3 IMPLANT
SHEATH PINNACLE 6F 10CM (SHEATH) ×3 IMPLANT
SHEATH PINNACLE MP 6F 45CM (SHEATH) ×3 IMPLANT
SHEATH PROBE COVER 6X72 (BAG) ×3 IMPLANT
STENT INNOVA 5X40X130 (Permanent Stent) ×3 IMPLANT
SYR MEDRAD MARK V 150ML (SYRINGE) ×3 IMPLANT
TRANSDUCER W/STOPCOCK (MISCELLANEOUS) ×3 IMPLANT
TRAY PV CATH (CUSTOM PROCEDURE TRAY) ×3 IMPLANT
WIRE G V18X300CM (WIRE) ×6 IMPLANT

## 2020-03-31 NOTE — Op Note (Signed)
DATE OF SERVICE: 03/31/2020  PATIENT:  [name]  64 y.o. female  PRE-OPERATIVE DIAGNOSIS:  Atherosclerosis of native arteries of left lower extremity with ulceration  POST-OPERATIVE DIAGNOSIS:  same  PROCEDURE:   1) US guided RCFA access 2) Aortogram 3) LLE angiogram with third order cannulation ( contrast) 4) SFA stenting (5x4mm Innova) 5) Popliteal drug-coated angioplasty (4x165mm Ranger) 6) Conscious sedation (79 min)  SURGEON:  Surgeon(s) and Role:    * Leonie Douglas, MD - Primary  ASSISTANT: none  ANESTHESIA:   local and IV sedation  EBL: min  BLOOD ADMINISTERED:none  DRAINS: none   LOCAL MEDICATIONS USED:  LIDOCAINE   SPECIMEN:  none  COUNTS: confirmed correct.  TOURNIQUET:  * No tourniquets in log *  PATIENT DISPOSITION:  PACU - hemodynamically stable.   Delay start of Pharmacological VTE agent (>24hrs) due to surgical blood loss or risk of bleeding: no  INDICATION FOR PROCEDURE: [name] is a 64 y.o. female with left great toe ulcer with evidence of PAD on non-invasive vascular exam. After careful discussion of risks, benefits, and alternatives the patient was offered angiography. We specifically discussed access site complications. The patient understood and wished to proceed.  OPERATIVE FINDINGS:  No evidence of flow limiting stenosis in terminal aorta or iliac vessels Severe focal L SFA stenosis >90% Severe diffuse distal L SFA and popliteal stenosis ~75% AT and Peroneal runoff to the left foot Pedal / digital disease noted in the left foot  DESCRIPTION OF PROCEDURE: After identification of the patient in the pre-operative holding area, the patient was transferred to the operating room. The patient was positioned supine on the operating room table. Anesthesia was induced. The groins was prepped and draped in standard fashion. A surgical pause was performed confirming correct patient, procedure, and operative location.  The right  groin was anesthetized with subcutaneous injection of 1% lidocaine. Using ultrasound guidance, the right common femoral artery was accessed with micropuncture technique. Fluoroscopy was used to confirm cannulation over the femoral head. Sheathogram was not performed. The 99F sheath was upsized to 33F.   An 035 glidewire advantage was advanced into the distal aorta. Over the wire an omni flush catheter was advanced to the level of L2. Aortogram was performed - see above for details.   The left common iliac artery was selected with the 035 glidewire advantage. The wire was advanced into the superficial femoral artery. Over the wire the omni flush catheter was advanced into the external iliac artery. Selective angiography was performed - see above for details.   The decision was made to intervene. The patient was heparinized with 9000 units of heparin. The sheath was exchanged for a 13F x 45 cm sheath. Selective angiography of the left lower extremity was performed prior to intervention. The lesions were treated with SFA stenting (5x33mm innova stent) and drug-coated angioplasty of the popliteal artery (4x132mm Ranger).  Completion angiography revealed <20% residual stenosis.  The sheath was exchanged for a short 13F sheath. A mynx device was used to close the arteriotomy. Hemostasis was excellent upon completion.  Upon completion of the case instrument and sharps counts were confirmed correct. The patient was transferred to the  PACU in good condition. I was present for all portions of the procedure.  Rande Brunt. Lenell Antu, MD [facility] Phone Number: [phone] 03/31/2020 2:37 PM

## 2020-03-31 NOTE — H&P (Signed)
See below for H&P details. No changes to H&P.     ASSESSMENT & PLAN:  64 y.o. female with atherosclerosis of native arteries of left lower extremity with great toe ulceration.  Recommend:  Complete cessation from all tobacco products. Blood glucose control with goal A1c < 7%. Blood pressure control with goal blood pressure < 140/90 mmHg. Lipid reduction therapy with goal LDL-C <100 mg/dL (<56 if symptomatic from PAD).  Aspirin 81mg  PO QD.  Atorvastatin 40-80mg  PO QD (or other "high intensity" statin therapy).  For LLE angiogram via [facility] access 03/31/20.  CHIEF COMPLAINT:   Left great toe ulceration  HISTORY:  HISTORY OF PRESENT ILLNESS: [name] is a 64 y.o. female referred to clinic for evaluation of left great toe ulceration.  This began in the beginning of December.  His progress.  The foot is not painful.  She reports a several year history of numbness described to lumbar stenosis.  She reports no burning or electric discomfort consistent with diabetic neuropathy.  She is ambulatory.  She can walk as far she wants without pain.  She has no symptoms consistent with ischemic rest pain.  She reports history of ulceration on her right fifth toe which healed spontaneously, but took greater than 2 months to heal.      Past Medical History:  Diagnosis Date  . Arthritis    OSTEO  . Diabetes mellitus without complication (HCC)    TYPE 2  . Family history of adverse reaction to anesthesia    Mother - N/V  . Goiter   . Hyperlipidemia   . Hypertension   . PONV (postoperative nausea and vomiting)   . Retinopathy          Past Surgical History:  Procedure Laterality Date  . ABDOMINAL HYSTERECTOMY    . AIR/FLUID EXCHANGE Left 11/30/2014   Procedure: AIR/FLUID EXCHANGE LEFT EYE;  Surgeon: 12/02/2014, MD;  Location: [facility] OR;  Service: Ophthalmology;  Laterality: Left;  . BACK SURGERY    . CESAREAN SECTION     x 2  . EYE SURGERY    .  INJECTION OF SILICONE OIL Right 05/26/2014   Procedure: INJECTION OF SILICONE OIL;  Surgeon: 05/28/2014, MD;  Location: [facility] OR;  Service: Ophthalmology;  Laterality: Right;  . INJECTION OF SILICONE OIL Left 11/30/2014   Procedure: INJECTION OF SILICONE OIL;  Surgeon: 12/02/2014, MD;  Location: [facility] OR;  Service: Ophthalmology;  Laterality: Left;  . LASER PHOTO ABLATION Right 05/26/2014   Procedure: LASER PHOTO ABLATION;  Surgeon: 05/28/2014, MD;  Location: [facility] OR;  Service: Ophthalmology;  Laterality: Right;  . PARS PLANA VITRECTOMY Right 05/26/2014   Procedure: PARS PLANA VITRECTOMY 25 GAUGE;  Surgeon: 05/28/2014, MD;  Location: [facility] OR;  Service: Ophthalmology;  Laterality: Right;  . PARS PLANA VITRECTOMY Left 11/30/2014   Procedure: PARS PLANA VITRECTOMY WITH 25 GAUGE with endolaser;  Surgeon: 12/02/2014, MD;  Location: [facility] OR;  Service: Ophthalmology;  Laterality: Left;  . TONSILLECTOMY    . TOTAL HIP ARTHROPLASTY Right 04/21/2014   dr 04/23/2014  . TOTAL HIP ARTHROPLASTY Right 04/21/2014   Procedure: RIGHT TOTAL HIP ARTHROPLASTY ANTERIOR APPROACH;  Surgeon: 04/23/2014, MD;  Location: [facility] OR;  Service: Orthopedics;  Laterality: Right;         Family History  Problem Relation Age of Onset  . Healthy Mother   . Healthy Father     Social History  Socioeconomic History  . Marital status: Married    Spouse name: Not on file  . Number of children: Not on file  . Years of education: Not on file  . Highest education level: Not on file  Occupational History  . Not on file  Tobacco Use  . Smoking status: Never Smoker  . Smokeless tobacco: Never Used  Vaping Use  . Vaping Use: Never used  Substance and Sexual Activity  . Alcohol use: No  . Drug use: No  . Sexual activity: Not on file  Other Topics Concern  . Not on file  Social History Narrative  . Not on file   Social Determinants of Health   Financial Resource Strain: Not on file  Food  Insecurity: Not on file  Transportation Needs: Not on file  Physical Activity: Not on file  Stress: Not on file  Social Connections: Not on file  Intimate Partner Violence: Not on file        Allergies  Allergen Reactions  . Anesthetics, Amide Nausea And Vomiting  . Codeine Nausea And Vomiting          Current Outpatient Medications  Medication Sig Dispense Refill  . acetaminophen (TYLENOL) 500 MG tablet Take 1,000 mg by mouth every 6 (six) hours as needed.    Marland Kitchen atorvastatin (LIPITOR) 10 MG tablet Take 10 mg by mouth daily.    Marland Kitchen b complex vitamins tablet Take 1 tablet by mouth daily.    Marland Kitchen glipiZIDE (GLUCOTROL) 10 MG tablet Take 10 mg by mouth 2 (two) times daily.    . Glucos-Chond-Hyal Ac-Ca Fructo (MOVE FREE JOINT HEALTH ADVANCE PO) Take 1 tablet by mouth daily.    Marland Kitchen lisinopril-hydrochlorothiazide (ZESTORETIC) 20-12.5 MG tablet Take 2 tablets by mouth daily.    . Multiple Vitamins-Minerals (MULTIVITAMIN PO) Take 1 tablet by mouth daily.    . Multiple Vitamins-Minerals (OCUVITE ADULT 50+ PO) Take 1 tablet by mouth daily.    . Omega 3-6-9 Fatty Acids (OMEGA 3-6-9 COMPLEX PO) Take 1 capsule by mouth 2 (two) times daily.    . sitaGLIPtin-metformin (JANUMET) 50-1000 MG per tablet Take 1 tablet by mouth 2 (two) times daily with a meal.    . glucose 4 GM chewable tablet Chew 1 tablet by mouth as needed for low blood sugar. (Patient not taking: Reported on 03/30/2020)     No current facility-administered medications for this visit.    REVIEW OF SYSTEMS:  [X]  denotes positive finding, [ ]  denotes negative finding Cardiac  Comments:  Chest pain or chest pressure:    Shortness of breath upon exertion:    Short of breath when lying flat:    Irregular heart rhythm:        Vascular    Pain in calf, thigh, or hip brought on by ambulation:    Pain in feet at night that wakes you up from your sleep:     Blood clot in your veins:    Leg  swelling:         Pulmonary    Oxygen at home:    Productive cough:     Wheezing:         Neurologic    Sudden weakness in arms or legs:     Sudden numbness in arms or legs:     Sudden onset of difficulty speaking or slurred speech:    Temporary loss of vision in one eye:     Problems with dizziness:  Gastrointestinal    Blood in stool:     Vomited blood:         Genitourinary    Burning when urinating:     Blood in urine:        Psychiatric    Major depression:         Hematologic    Bleeding problems:    Problems with blood clotting too easily:        Skin    Rashes or ulcers:        Constitutional    Fever or chills:     PHYSICAL EXAM:      Vitals:   03/30/20 1023  BP: (!) 178/85  Pulse: 100  Resp: 20  Temp: 98.2 F (36.8 C)  Weight: 191 lb (86.6 kg)  Height: 5\' 6"  (1.676 m)   Constitutional: Well appearing in no distress. Appears well nourished.  Neurologic: Normal gait and station. CN intact. No weakness. No sensory loss. Psychiatric: Mood and affect symmetric and appropriate. Eyes: No icterus. No conjunctival pallor. Ears, nose, throat: mucous membranes moist. Midline trachea. Cardiac: regular rate and rhythm.  Respiratory: unlabored. Abdominal: soft, non-tender, non-distended. No palpable pulsatile abdominal mass. Peripheral vascular:             Femoral pulse: L 1+ / R 1+             No palpable pedal pulses        Extremity: No edema. No cyanosis. No pallor.  Skin: No gangrene. + ulceration of left distal great toe.  Lymphatic: No Stemmer's sign. No palpable lymphadenopathy.  DATA REVIEW:    Most recent CBC CBC Latest Ref Rng & Units 04/20/2019 11/30/2014 05/26/2014  WBC 4.0 - 10.5 K/uL 6.3 8.0 6.6  Hemoglobin 12.0 - 15.0 g/dL 13.6 13.6 10.4(L)  Hematocrit 36.0 - 46.0 % 41.8 41.3 33.0(L)  Platelets 150 - 400 K/uL 226 235 277     Most recent CMP CMP  Latest Ref Rng & Units 04/20/2019 11/30/2014 05/26/2014  Glucose 70 - 99 mg/dL 312(H) 120(H) 123(H)  BUN 8 - 23 mg/dL 14 18 24(H)  Creatinine 0.44 - 1.00 mg/dL 0.90 0.70 0.69  Sodium 135 - 145 mmol/L 132(L) 141 139  Potassium 3.5 - 5.1 mmol/L 4.3 4.3 4.1  Chloride 98 - 111 mmol/L 95(L) 109 107  CO2 22 - 32 mmol/L 24 24 26   Calcium 8.9 - 10.3 mg/dL 8.9 9.4 9.5  Total Protein 6.5 - 8.1 g/dL 6.8 - -  Total Bilirubin 0.3 - 1.2 mg/dL 0.8 - -  Alkaline Phos 38 - 126 U/L 59 - -  AST 15 - 41 U/L 53(H) - -  ALT 0 - 44 U/L 47(H) - -     ABI 03/30/20   Yevonne Aline. Stanford Breed, MD [facility] Phone Number: [phone] 03/30/2020 10:33 AM

## 2020-03-31 NOTE — Discharge Instructions (Signed)

## 2020-03-31 NOTE — Progress Notes (Signed)
Pt ambulated without difficulty or bleeding.   Discharged home with husband who will drive and stay with pt x 24 hrs  

## 2020-04-01 ENCOUNTER — Encounter (HOSPITAL_COMMUNITY): Payer: Self-pay | Admitting: Vascular Surgery

## 2020-04-16 ENCOUNTER — Other Ambulatory Visit: Payer: Self-pay

## 2020-04-16 DIAGNOSIS — I70245 Atherosclerosis of native arteries of left leg with ulceration of other part of foot: Secondary | ICD-10-CM

## 2020-05-04 ENCOUNTER — Ambulatory Visit (HOSPITAL_COMMUNITY)
Admission: RE | Admit: 2020-05-04 | Discharge: 2020-05-04 | Disposition: A | Discharge status: Home / Self Care | Payer: Medicare Other | Source: Ambulatory Visit | Attending: Vascular Surgery | Admitting: Vascular Surgery

## 2020-05-04 ENCOUNTER — Ambulatory Visit (INDEPENDENT_AMBULATORY_CARE_PROVIDER_SITE_OTHER)
Admission: RE | Admit: 2020-05-04 | Discharge: 2020-05-04 | Disposition: A | Discharge status: Home / Self Care | Payer: Medicare Other | Source: Ambulatory Visit | Attending: Vascular Surgery | Admitting: Vascular Surgery

## 2020-05-04 ENCOUNTER — Encounter: Payer: Self-pay | Admitting: Vascular Surgery

## 2020-05-04 ENCOUNTER — Other Ambulatory Visit: Payer: Self-pay

## 2020-05-04 ENCOUNTER — Ambulatory Visit (INDEPENDENT_AMBULATORY_CARE_PROVIDER_SITE_OTHER): Payer: Medicare Other | Admitting: Vascular Surgery

## 2020-05-04 VITALS — BP 156/81 | HR 92 | Temp 98.1°F | Resp 20 | Ht 66.0 in | Wt 192.0 lb

## 2020-05-04 DIAGNOSIS — I70245 Atherosclerosis of native arteries of left leg with ulceration of other part of foot: Secondary | ICD-10-CM | POA: Insufficient documentation

## 2020-05-04 DIAGNOSIS — I7025 Atherosclerosis of native arteries of other extremities with ulceration: Secondary | ICD-10-CM

## 2020-05-04 NOTE — Progress Notes (Signed)
ASSESSMENT & PLAN:  65 y.o. female with atherosclerosis of native arteries of left lower extremity with great toe ulceration. Status post L SFA stenting and popliteal angioplasty 03/31/20. Her left toe is healing well. She is from [place] and desires follow up locally. I have asked that she see a podiatrist for ongoing wound care. She can see my partner - Dr. Donnetta Hutching - in [place] in 6 months for surveillance ABI.  Recommend:  Complete cessation from all tobacco products. Blood glucose control with goal A1c < 7%. Blood pressure control with goal blood pressure < 140/90 mmHg. Lipid reduction therapy with goal LDL-C <100 mg/dL (<70 if symptomatic from PAD).  Aspirin 81mg  PO QD indefinitely.  Clopidogrel 75mg  PO QD x 12 months (stop 03/31/21) Atorvastatin 40-80mg  PO QD (or other "high intensity" statin therapy) indefinitely.  CHIEF COMPLAINT:   Left great toe ulceration  HISTORY:  HISTORY OF PRESENT ILLNESS: [name] is a 65 y.o. female referred to clinic for evaluation of left great toe ulceration.  This began in the beginning of December.  His progress.  The foot is not painful.  She reports a several year history of numbness described to lumbar stenosis.  She reports no burning or electric discomfort consistent with diabetic neuropathy.  She is ambulatory.  She can walk as far she wants without pain.  She has no symptoms consistent with ischemic rest pain.  She reports history of ulceration on her right fifth toe which healed spontaneously, but took greater than 2 months to heal.  Past Medical History:  Diagnosis Date  . Arthritis    OSTEO  . Diabetes mellitus without complication ([place])    TYPE 2  . Family history of adverse reaction to anesthesia    Mother - N/V  . Goiter   . Hyperlipidemia   . Hypertension   . PONV (postoperative nausea and vomiting)   . Retinopathy     Past Surgical History:  Procedure Laterality Date  . ABDOMINAL AORTOGRAM W/LOWER EXTREMITY  N/A 03/31/2020   Procedure: ABDOMINAL AORTOGRAM W/LOWER EXTREMITY;  Surgeon: Cherre Robins, MD;  Location: [facility];  Service: Cardiovascular;  Laterality: N/A;  . ABDOMINAL HYSTERECTOMY    . AIR/FLUID EXCHANGE Left 11/30/2014   Procedure: AIR/FLUID EXCHANGE LEFT EYE;  Surgeon: Hurman Horn, MD;  Location: [place];  Service: Ophthalmology;  Laterality: Left;  . BACK SURGERY    . CESAREAN SECTION     x 2  . EYE SURGERY    . INJECTION OF SILICONE OIL Right 7/86/7544   Procedure: INJECTION OF SILICONE OIL;  Surgeon: Hurman Horn, MD;  Location: [place];  Service: Ophthalmology;  Laterality: Right;  . INJECTION OF SILICONE OIL Left 12/21/1005   Procedure: INJECTION OF SILICONE OIL;  Surgeon: Hurman Horn, MD;  Location: [place];  Service: Ophthalmology;  Laterality: Left;  . LASER PHOTO ABLATION Right 05/26/2014   Procedure: LASER PHOTO ABLATION;  Surgeon: Hurman Horn, MD;  Location: [place];  Service: Ophthalmology;  Laterality: Right;  . PARS PLANA VITRECTOMY Right 05/26/2014   Procedure: PARS PLANA VITRECTOMY 25 GAUGE;  Surgeon: Hurman Horn, MD;  Location: [place];  Service: Ophthalmology;  Laterality: Right;  . PARS PLANA VITRECTOMY Left 11/30/2014   Procedure: PARS PLANA VITRECTOMY WITH 25 GAUGE with endolaser;  Surgeon: Hurman Horn, MD;  Location: [place];  Service: Ophthalmology;  Laterality: Left;  . PERIPHERAL VASCULAR INTERVENTION Left 03/31/2020   Procedure: PERIPHERAL VASCULAR INTERVENTION;  Surgeon: Cherre Robins, MD;  Location: [place] [facility];  Service: Cardiovascular;  Laterality: Left;  superficial femoral  . TONSILLECTOMY    . TOTAL HIP ARTHROPLASTY Right 04/21/2014   dr Percell Miller  . TOTAL HIP ARTHROPLASTY Right 04/21/2014   Procedure: RIGHT TOTAL HIP ARTHROPLASTY ANTERIOR APPROACH;  Surgeon: Renette Butters, MD;  Location: [place];  Service: Orthopedics;  Laterality: Right;    Family History  Problem Relation Age of Onset  . Healthy Mother   . Healthy Father      Social History   Socioeconomic History  . Marital status: Married    Spouse name: Not on file  . Number of children: Not on file  . Years of education: Not on file  . Highest education level: Not on file  Occupational History  . Not on file  Tobacco Use  . Smoking status: Never Smoker  . Smokeless tobacco: Never Used  Vaping Use  . Vaping Use: Never used  Substance and Sexual Activity  . Alcohol use: No  . Drug use: No  . Sexual activity: Not on file  Other Topics Concern  . Not on file  Social History Narrative  . Not on file   Social Determinants of Health   Financial Resource Strain: Not on file  Food Insecurity: Not on file  Transportation Needs: Not on file  Physical Activity: Not on file  Stress: Not on file  Social Connections: Not on file  Intimate Partner Violence: Not on file    Allergies  Allergen Reactions  . Anesthetics, Amide Nausea And Vomiting  . Codeine Nausea And Vomiting    Current Outpatient Medications  Medication Sig Dispense Refill  . acetaminophen (TYLENOL) 500 MG tablet Take 1,000 mg by mouth every 6 (six) hours as needed.    Marland Kitchen aspirin EC 81 MG tablet Take 1 tablet (81 mg total) by mouth daily. Swallow whole. 150 tablet 2  . atorvastatin (LIPITOR) 10 MG tablet Take 10 mg by mouth daily.    Marland Kitchen b complex vitamins tablet Take 1 tablet by mouth daily.    . clopidogrel (PLAVIX) 75 MG tablet Take 1 tablet (75 mg total) by mouth daily. 30 tablet 11  . glipiZIDE (GLUCOTROL) 10 MG tablet Take 10 mg by mouth 2 (two) times daily.    . Glucos-Chond-Hyal Ac-Ca Fructo (MOVE FREE JOINT HEALTH ADVANCE PO) Take 1 tablet by mouth daily.    Marland Kitchen lisinopril-hydrochlorothiazide (ZESTORETIC) 20-12.5 MG tablet Take 2 tablets by mouth daily.    . Multiple Vitamins-Minerals (MULTIVITAMIN PO) Take 1 tablet by mouth daily.    . Multiple Vitamins-Minerals (OCUVITE ADULT 50+ PO) Take 1 tablet by mouth daily.    . Omega 3-6-9 Fatty Acids (OMEGA 3-6-9 COMPLEX PO) Take  1 capsule by mouth 2 (two) times daily.    . sitaGLIPtin-metformin (JANUMET) 50-1000 MG per tablet Take 1 tablet by mouth 2 (two) times daily with a meal.    . glucose 4 GM chewable tablet Chew 1 tablet by mouth as needed for low blood sugar. (Patient not taking: Reported on 05/04/2020)     No current facility-administered medications for this visit.    REVIEW OF SYSTEMS:  [X]  denotes positive finding, [ ]  denotes negative finding Cardiac  Comments:  Chest pain or chest pressure:    Shortness of breath upon exertion:    Short of breath when lying flat:    Irregular heart rhythm:        Vascular    Pain in  calf, thigh, or hip brought on by ambulation:    Pain in feet at night that wakes you up from your sleep:     Blood clot in your veins:    Leg swelling:         Pulmonary    Oxygen at home:    Productive cough:     Wheezing:         Neurologic    Sudden weakness in arms or legs:     Sudden numbness in arms or legs:     Sudden onset of difficulty speaking or slurred speech:    Temporary loss of vision in one eye:     Problems with dizziness:         Gastrointestinal    Blood in stool:     Vomited blood:         Genitourinary    Burning when urinating:     Blood in urine:        Psychiatric    Major depression:         Hematologic    Bleeding problems:    Problems with blood clotting too easily:        Skin    Rashes or ulcers:        Constitutional    Fever or chills:     PHYSICAL EXAM:   Vitals:   05/04/20 1453  BP: (!) 156/81  Pulse: 92  Resp: 20  Temp: 98.1 F (36.7 C)  SpO2: 98%  Weight: 192 lb (87.1 kg)  Height: 5\' 6"  (1.676 m)   Constitutional: Well appearing in no distress. Appears well nourished.  Neurologic: Normal gait and station. CN intact. No weakness. No sensory loss. Psychiatric: Mood and affect symmetric and appropriate. Eyes: No icterus. No conjunctival pallor. Ears, nose, throat: mucous membranes moist. Midline trachea. Cardiac:  regular rate and rhythm.  Respiratory: unlabored. Abdominal: soft, non-tender, non-distended. No palpable pulsatile abdominal mass. Peripheral vascular:  Femoral pulse: L 1+ / R 1+  No palpable pedal pulses  Extremity: No edema. No cyanosis. No pallor.  Skin: No gangrene. + ulceration of left distal great toe.  Lymphatic: No Stemmer's sign. No palpable lymphadenopathy.  DATA REVIEW:    Most recent CBC CBC Latest Ref Rng & Units 03/31/2020 04/20/2019 11/30/2014  WBC 4.0 - 10.5 K/uL - 6.3 8.0  Hemoglobin 12.0 - 15.0 g/dL 14.3 13.6 13.6  Hematocrit 36.0 - 46.0 % 42.0 41.8 41.3  Platelets 150 - 400 K/uL - 226 235     Most recent CMP CMP Latest Ref Rng & Units 03/31/2020 04/20/2019 11/30/2014  Glucose 70 - 99 mg/dL 201(H) 312(H) 120(H)  BUN 8 - 23 mg/dL 22 14 18   Creatinine 0.44 - 1.00 mg/dL 0.60 0.90 0.70  Sodium 135 - 145 mmol/L 139 132(L) 141  Potassium 3.5 - 5.1 mmol/L 4.0 4.3 4.3  Chloride 98 - 111 mmol/L 105 95(L) 109  CO2 22 - 32 mmol/L - 24 24  Calcium 8.9 - 10.3 mg/dL - 8.9 9.4  Total Protein 6.5 - 8.1 g/dL - 6.8 -  Total Bilirubin 0.3 - 1.2 mg/dL - 0.8 -  Alkaline Phos 38 - 126 U/L - 59 -  AST 15 - 41 U/L - 53(H) -  ALT 0 - 44 U/L - 47(H) -    ABI 05/04/20  R 0.73  L 0.83 (previous 0.79)  No toe pressure taken LLE  Arterial ultrasound     Yevonne Aline. Stanford Breed, MD [facility] Office Phone Number: [phone] 05/04/2020 5:01 PM

## 2020-05-05 ENCOUNTER — Other Ambulatory Visit: Payer: Self-pay

## 2020-05-05 DIAGNOSIS — I7025 Atherosclerosis of native arteries of other extremities with ulceration: Secondary | ICD-10-CM

## 2020-06-02 ENCOUNTER — Other Ambulatory Visit (HOSPITAL_COMMUNITY): Payer: Self-pay | Admitting: Internal Medicine

## 2020-06-02 ENCOUNTER — Ambulatory Visit (HOSPITAL_COMMUNITY)
Admission: RE | Admit: 2020-06-02 | Discharge: 2020-06-02 | Disposition: A | Discharge status: Home / Self Care | Payer: Medicare Other | Source: Ambulatory Visit | Attending: Internal Medicine | Admitting: Internal Medicine

## 2020-06-02 ENCOUNTER — Other Ambulatory Visit: Payer: Self-pay

## 2020-06-02 DIAGNOSIS — Z885 Allergy status to narcotic agent status: Secondary | ICD-10-CM | POA: Diagnosis not present

## 2020-06-02 DIAGNOSIS — E11319 Type 2 diabetes mellitus with unspecified diabetic retinopathy without macular edema: Secondary | ICD-10-CM | POA: Diagnosis not present

## 2020-06-02 DIAGNOSIS — M19072 Primary osteoarthritis, left ankle and foot: Secondary | ICD-10-CM | POA: Diagnosis not present

## 2020-06-02 DIAGNOSIS — Z90710 Acquired absence of both cervix and uterus: Secondary | ICD-10-CM | POA: Diagnosis not present

## 2020-06-02 DIAGNOSIS — E11621 Type 2 diabetes mellitus with foot ulcer: Secondary | ICD-10-CM | POA: Diagnosis not present

## 2020-06-02 DIAGNOSIS — M868X7 Other osteomyelitis, ankle and foot: Secondary | ICD-10-CM | POA: Diagnosis not present

## 2020-06-02 DIAGNOSIS — E785 Hyperlipidemia, unspecified: Secondary | ICD-10-CM | POA: Diagnosis not present

## 2020-06-02 DIAGNOSIS — Z20822 Contact with and (suspected) exposure to covid-19: Secondary | ICD-10-CM | POA: Diagnosis not present

## 2020-06-02 DIAGNOSIS — Z7982 Long term (current) use of aspirin: Secondary | ICD-10-CM | POA: Diagnosis not present

## 2020-06-02 DIAGNOSIS — S91102A Unspecified open wound of left great toe without damage to nail, initial encounter: Secondary | ICD-10-CM

## 2020-06-02 DIAGNOSIS — I10 Essential (primary) hypertension: Secondary | ICD-10-CM | POA: Diagnosis not present

## 2020-06-02 DIAGNOSIS — E1151 Type 2 diabetes mellitus with diabetic peripheral angiopathy without gangrene: Secondary | ICD-10-CM | POA: Diagnosis not present

## 2020-06-02 DIAGNOSIS — L97529 Non-pressure chronic ulcer of other part of left foot with unspecified severity: Secondary | ICD-10-CM | POA: Diagnosis not present

## 2020-06-02 DIAGNOSIS — Z7984 Long term (current) use of oral hypoglycemic drugs: Secondary | ICD-10-CM | POA: Diagnosis not present

## 2020-06-02 DIAGNOSIS — M7989 Other specified soft tissue disorders: Secondary | ICD-10-CM | POA: Diagnosis not present

## 2020-06-02 DIAGNOSIS — Z79899 Other long term (current) drug therapy: Secondary | ICD-10-CM | POA: Diagnosis not present

## 2020-06-02 DIAGNOSIS — Z7902 Long term (current) use of antithrombotics/antiplatelets: Secondary | ICD-10-CM | POA: Diagnosis not present

## 2020-06-02 DIAGNOSIS — S93105A Unspecified dislocation of left toe(s), initial encounter: Secondary | ICD-10-CM

## 2020-06-02 DIAGNOSIS — S91102D Unspecified open wound of left great toe without damage to nail, subsequent encounter: Secondary | ICD-10-CM | POA: Diagnosis not present

## 2020-06-02 DIAGNOSIS — L03116 Cellulitis of left lower limb: Secondary | ICD-10-CM | POA: Diagnosis not present

## 2020-06-02 DIAGNOSIS — E663 Overweight: Secondary | ICD-10-CM | POA: Diagnosis present

## 2020-06-02 DIAGNOSIS — Z95820 Peripheral vascular angioplasty status with implants and grafts: Secondary | ICD-10-CM | POA: Diagnosis not present

## 2020-06-02 DIAGNOSIS — Z6830 Body mass index (BMI) 30.0-30.9, adult: Secondary | ICD-10-CM | POA: Diagnosis not present

## 2020-06-02 DIAGNOSIS — M869 Osteomyelitis, unspecified: Secondary | ICD-10-CM | POA: Diagnosis not present

## 2020-06-02 DIAGNOSIS — I159 Secondary hypertension, unspecified: Secondary | ICD-10-CM | POA: Diagnosis not present

## 2020-06-02 DIAGNOSIS — Z96641 Presence of right artificial hip joint: Secondary | ICD-10-CM | POA: Diagnosis not present

## 2020-06-02 DIAGNOSIS — E1169 Type 2 diabetes mellitus with other specified complication: Secondary | ICD-10-CM | POA: Diagnosis not present

## 2020-06-02 DIAGNOSIS — M7732 Calcaneal spur, left foot: Secondary | ICD-10-CM | POA: Diagnosis not present

## 2020-06-03 ENCOUNTER — Other Ambulatory Visit: Payer: Self-pay

## 2020-06-03 ENCOUNTER — Inpatient Hospital Stay (HOSPITAL_COMMUNITY)
Admission: EM | Admit: 2020-06-03 | Discharge: 2020-06-05 | DRG: 623 | Disposition: A | Discharge status: Home / Self Care | Payer: Medicare Other | Attending: Internal Medicine | Admitting: Internal Medicine

## 2020-06-03 ENCOUNTER — Encounter (HOSPITAL_COMMUNITY): Payer: Self-pay | Admitting: Emergency Medicine

## 2020-06-03 DIAGNOSIS — I10 Essential (primary) hypertension: Secondary | ICD-10-CM | POA: Diagnosis present

## 2020-06-03 DIAGNOSIS — Z885 Allergy status to narcotic agent status: Secondary | ICD-10-CM

## 2020-06-03 DIAGNOSIS — E663 Overweight: Secondary | ICD-10-CM | POA: Diagnosis present

## 2020-06-03 DIAGNOSIS — Z90710 Acquired absence of both cervix and uterus: Secondary | ICD-10-CM | POA: Diagnosis not present

## 2020-06-03 DIAGNOSIS — E785 Hyperlipidemia, unspecified: Secondary | ICD-10-CM | POA: Diagnosis present

## 2020-06-03 DIAGNOSIS — Z20822 Contact with and (suspected) exposure to covid-19: Secondary | ICD-10-CM | POA: Diagnosis present

## 2020-06-03 DIAGNOSIS — Z95820 Peripheral vascular angioplasty status with implants and grafts: Secondary | ICD-10-CM

## 2020-06-03 DIAGNOSIS — Z7984 Long term (current) use of oral hypoglycemic drugs: Secondary | ICD-10-CM

## 2020-06-03 DIAGNOSIS — Z79899 Other long term (current) drug therapy: Secondary | ICD-10-CM

## 2020-06-03 DIAGNOSIS — E1169 Type 2 diabetes mellitus with other specified complication: Principal | ICD-10-CM | POA: Diagnosis present

## 2020-06-03 DIAGNOSIS — L97529 Non-pressure chronic ulcer of other part of left foot with unspecified severity: Secondary | ICD-10-CM | POA: Diagnosis not present

## 2020-06-03 DIAGNOSIS — E1151 Type 2 diabetes mellitus with diabetic peripheral angiopathy without gangrene: Secondary | ICD-10-CM | POA: Diagnosis present

## 2020-06-03 DIAGNOSIS — L97509 Non-pressure chronic ulcer of other part of unspecified foot with unspecified severity: Secondary | ICD-10-CM

## 2020-06-03 DIAGNOSIS — M869 Osteomyelitis, unspecified: Secondary | ICD-10-CM | POA: Diagnosis not present

## 2020-06-03 DIAGNOSIS — Z7982 Long term (current) use of aspirin: Secondary | ICD-10-CM

## 2020-06-03 DIAGNOSIS — E11621 Type 2 diabetes mellitus with foot ulcer: Secondary | ICD-10-CM | POA: Diagnosis present

## 2020-06-03 DIAGNOSIS — M868X7 Other osteomyelitis, ankle and foot: Secondary | ICD-10-CM | POA: Diagnosis present

## 2020-06-03 DIAGNOSIS — Z6830 Body mass index (BMI) 30.0-30.9, adult: Secondary | ICD-10-CM | POA: Diagnosis not present

## 2020-06-03 DIAGNOSIS — I159 Secondary hypertension, unspecified: Secondary | ICD-10-CM | POA: Diagnosis not present

## 2020-06-03 DIAGNOSIS — L03116 Cellulitis of left lower limb: Principal | ICD-10-CM

## 2020-06-03 DIAGNOSIS — Z96641 Presence of right artificial hip joint: Secondary | ICD-10-CM | POA: Diagnosis present

## 2020-06-03 DIAGNOSIS — Z7902 Long term (current) use of antithrombotics/antiplatelets: Secondary | ICD-10-CM | POA: Diagnosis not present

## 2020-06-03 LAB — HEMOGLOBIN A1C
Hgb A1c MFr Bld: 7.2 % — ABNORMAL HIGH (ref 4.8–5.6)
Mean Plasma Glucose: 159.94 mg/dL

## 2020-06-03 LAB — CBC WITH DIFFERENTIAL/PLATELET
Abs Immature Granulocytes: 0.02 10*3/uL (ref 0.00–0.07)
Basophils Absolute: 0 10*3/uL (ref 0.0–0.1)
Basophils Relative: 0 %
Eosinophils Absolute: 0.1 10*3/uL (ref 0.0–0.5)
Eosinophils Relative: 2 %
HCT: 40.3 % (ref 36.0–46.0)
Hemoglobin: 12.5 g/dL (ref 12.0–15.0)
Immature Granulocytes: 0 %
Lymphocytes Relative: 19 %
Lymphs Abs: 1.6 10*3/uL (ref 0.7–4.0)
MCH: 27.7 pg (ref 26.0–34.0)
MCHC: 31 g/dL (ref 30.0–36.0)
MCV: 89.4 fL (ref 80.0–100.0)
Monocytes Absolute: 0.8 10*3/uL (ref 0.1–1.0)
Monocytes Relative: 9 %
Neutro Abs: 5.8 10*3/uL (ref 1.7–7.7)
Neutrophils Relative %: 70 %
Platelets: 343 10*3/uL (ref 150–400)
RBC: 4.51 MIL/uL (ref 3.87–5.11)
RDW: 13.3 % (ref 11.5–15.5)
WBC: 8.4 10*3/uL (ref 4.0–10.5)
nRBC: 0 % (ref 0.0–0.2)

## 2020-06-03 LAB — COMPREHENSIVE METABOLIC PANEL
ALT: 20 U/L (ref 0–44)
AST: 16 U/L (ref 15–41)
Albumin: 3.6 g/dL (ref 3.5–5.0)
Alkaline Phosphatase: 69 U/L (ref 38–126)
Anion gap: 11 (ref 5–15)
BUN: 19 mg/dL (ref 8–23)
CO2: 24 mmol/L (ref 22–32)
Calcium: 9.7 mg/dL (ref 8.9–10.3)
Chloride: 103 mmol/L (ref 98–111)
Creatinine, Ser: 0.67 mg/dL (ref 0.44–1.00)
GFR, Estimated: >60 mL/min (ref >60)
Glucose, Bld: 173 mg/dL — ABNORMAL HIGH (ref 70–99)
Potassium: 4.5 mmol/L (ref 3.5–5.1)
Sodium: 138 mmol/L (ref 135–145)
Total Bilirubin: 0.6 mg/dL (ref 0.3–1.2)
Total Protein: 7 g/dL (ref 6.5–8.1)

## 2020-06-03 LAB — GLUCOSE, CAPILLARY: Glucose-Capillary: 223 mg/dL — ABNORMAL HIGH (ref 70–99)

## 2020-06-03 LAB — RESP PANEL BY RT-PCR (FLU A&B, COVID) ARPGX2
Influenza A by PCR: NEGATIVE
Influenza B by PCR: NEGATIVE
SARS Coronavirus 2 by RT PCR: NEGATIVE

## 2020-06-03 MED ORDER — INSULIN ASPART 100 UNIT/ML ~~LOC~~ SOLN
0.0000 [IU] | Freq: Three times a day (TID) | SUBCUTANEOUS | Status: DC
  Administered 2020-06-04: 5 [IU] via SUBCUTANEOUS
  Administered 2020-06-04 – 2020-06-05 (×3): 3 [IU] via SUBCUTANEOUS

## 2020-06-03 MED ORDER — LISINOPRIL-HYDROCHLOROTHIAZIDE 20-12.5 MG PO TABS: 2.0000 | ORAL_TABLET | Freq: Every day | ORAL | Status: DC

## 2020-06-03 MED ORDER — TRAZODONE HCL 50 MG PO TABS
25.0000 mg | ORAL_TABLET | Freq: Every evening | ORAL | Status: DC | PRN
Start: 2020-06-03 — End: 2020-06-05
  Administered 2020-06-04: 25 mg via ORAL
  Filled 2020-06-03: qty 1

## 2020-06-03 MED ORDER — SITAGLIPTIN PHOS-METFORMIN HCL 50-1000 MG PO TABS: 1.0000 | ORAL_TABLET | Freq: Two times a day (BID) | ORAL | Status: DC

## 2020-06-03 MED ORDER — GLIPIZIDE 5 MG PO TABS
10.0000 mg | ORAL_TABLET | Freq: Two times a day (BID) | ORAL | Status: DC
  Administered 2020-06-04 – 2020-06-05 (×3): 10 mg via ORAL
  Filled 2020-06-03 (×3): qty 2

## 2020-06-03 MED ORDER — ASPIRIN EC 81 MG PO TBEC
81.0000 mg | DELAYED_RELEASE_TABLET | Freq: Every day | ORAL | Status: DC
  Administered 2020-06-04 – 2020-06-05 (×2): 81 mg via ORAL
  Filled 2020-06-03 (×2): qty 1

## 2020-06-03 MED ORDER — SENNA 8.6 MG PO TABS
1.0000 | ORAL_TABLET | Freq: Two times a day (BID) | ORAL | Status: DC
  Administered 2020-06-04 – 2020-06-05 (×3): 8.6 mg via ORAL
  Filled 2020-06-03 (×4): qty 1

## 2020-06-03 MED ORDER — HYDROCHLOROTHIAZIDE 12.5 MG PO CAPS
12.5000 mg | ORAL_CAPSULE | Freq: Every day | ORAL | Status: DC
  Administered 2020-06-04 – 2020-06-05 (×2): 12.5 mg via ORAL
  Filled 2020-06-03 (×2): qty 1

## 2020-06-03 MED ORDER — ATORVASTATIN CALCIUM 10 MG PO TABS
10.0000 mg | ORAL_TABLET | Freq: Every day | ORAL | Status: DC
  Administered 2020-06-03 – 2020-06-05 (×3): 10 mg via ORAL
  Filled 2020-06-03 (×3): qty 1

## 2020-06-03 MED ORDER — VANCOMYCIN HCL IN DEXTROSE 1-5 GM/200ML-% IV SOLN
1000.0000 mg | Freq: Two times a day (BID) | INTRAVENOUS | Status: DC
  Administered 2020-06-04 – 2020-06-05 (×3): 1000 mg via INTRAVENOUS
  Filled 2020-06-03 (×4): qty 200

## 2020-06-03 MED ORDER — SODIUM CHLORIDE 0.9 % IV SOLN: 250.0000 mL | INTRAVENOUS | Status: DC | PRN

## 2020-06-03 MED ORDER — METFORMIN HCL 500 MG PO TABS
1000.0000 mg | ORAL_TABLET | Freq: Two times a day (BID) | ORAL | Status: DC
  Administered 2020-06-04 – 2020-06-05 (×3): 1000 mg via ORAL
  Filled 2020-06-03 (×3): qty 2

## 2020-06-03 MED ORDER — HEPARIN SODIUM (PORCINE) 5000 UNIT/ML IJ SOLN
5000.0000 [IU] | Freq: Three times a day (TID) | INTRAMUSCULAR | Status: DC
  Administered 2020-06-03 – 2020-06-04 (×4): 5000 [IU] via SUBCUTANEOUS
  Filled 2020-06-03 (×5): qty 1

## 2020-06-03 MED ORDER — SODIUM CHLORIDE 0.9% FLUSH
3.0000 mL | Freq: Two times a day (BID) | INTRAVENOUS | Status: DC
  Administered 2020-06-03 – 2020-06-05 (×4): 3 mL via INTRAVENOUS

## 2020-06-03 MED ORDER — SODIUM CHLORIDE 0.9% FLUSH: 3.0000 mL | INTRAVENOUS | Status: DC | PRN

## 2020-06-03 MED ORDER — LINAGLIPTIN 5 MG PO TABS
5.0000 mg | ORAL_TABLET | Freq: Two times a day (BID) | ORAL | Status: DC
  Administered 2020-06-04 – 2020-06-05 (×3): 5 mg via ORAL
  Filled 2020-06-03 (×3): qty 1

## 2020-06-03 MED ORDER — VANCOMYCIN HCL 1750 MG/350ML IV SOLN
1750.0000 mg | Freq: Once | INTRAVENOUS | Status: AC
  Administered 2020-06-03: 1750 mg via INTRAVENOUS
  Filled 2020-06-03: qty 350

## 2020-06-03 MED ORDER — ACETAMINOPHEN 500 MG PO TABS: 1000.0000 mg | ORAL_TABLET | Freq: Four times a day (QID) | ORAL | Status: DC | PRN

## 2020-06-03 MED ORDER — KETOROLAC TROMETHAMINE 15 MG/ML IJ SOLN: 15.0000 mg | Freq: Four times a day (QID) | INTRAMUSCULAR | Status: DC | PRN

## 2020-06-03 MED ORDER — CLOPIDOGREL BISULFATE 75 MG PO TABS
75.0000 mg | ORAL_TABLET | Freq: Every day | ORAL | Status: DC
  Administered 2020-06-04 – 2020-06-05 (×2): 75 mg via ORAL
  Filled 2020-06-03 (×2): qty 1

## 2020-06-03 MED ORDER — VANCOMYCIN HCL 2000 MG/400ML IV SOLN: 2000.0000 mg | Freq: Once | INTRAVENOUS | Status: DC

## 2020-06-03 MED ORDER — LISINOPRIL 10 MG PO TABS
20.0000 mg | ORAL_TABLET | Freq: Every day | ORAL | Status: DC
  Administered 2020-06-04 – 2020-06-05 (×2): 20 mg via ORAL
  Filled 2020-06-03 (×2): qty 2

## 2020-06-03 MED ORDER — SODIUM CHLORIDE 0.9 % IV SOLN
2.0000 g | Freq: Once | INTRAVENOUS | Status: AC
  Administered 2020-06-03: 2 g via INTRAVENOUS
  Filled 2020-06-03: qty 20

## 2020-06-03 NOTE — ED Provider Notes (Signed)
[facility] EMERGENCY DEPARTMENT Provider Note   CSN: [id number] Arrival date & time: 06/03/20  1511     History Chief Complaint  Patient presents with  . Toe Pain    [name] is a 65 y.o. female.  Patient has diabetes and has pain and swelling to her left foot and toe. She was seen by her doctor yesterday who took an x-ray and the results show some possible osteomyelitis to the large left toe. He sent her to the emergency department for treatment  The history is provided by the patient and medical records. No language interpreter was used.  Toe Pain This is a new problem. The current episode started more than 2 days ago. The problem occurs constantly. The problem has not changed since onset.Pertinent negatives include no chest pain, no abdominal pain and no headaches. Nothing relieves the symptoms. She has tried nothing for the symptoms.       Past Medical History:  Diagnosis Date  . Arthritis    OSTEO  . Diabetes mellitus without complication ([place])    TYPE 2  . Family history of adverse reaction to anesthesia    Mother - N/V  . Goiter   . Hyperlipidemia   . Hypertension   . PONV (postoperative nausea and vomiting)   . Retinopathy     Patient Active Problem List   Diagnosis Date Noted  . Atherosclerosis of native artery of left lower extremity with ulceration ([place]) 03/30/2020  . DJD (degenerative joint disease) 04/21/2014    Past Surgical History:  Procedure Laterality Date  . ABDOMINAL AORTOGRAM W/LOWER EXTREMITY N/A 03/31/2020   Procedure: ABDOMINAL AORTOGRAM W/LOWER EXTREMITY;  Surgeon: Cherre Robins, MD;  Location: [place] [facility];  Service: Cardiovascular;  Laterality: N/A;  . ABDOMINAL HYSTERECTOMY    . AIR/FLUID EXCHANGE Left 11/30/2014   Procedure: AIR/FLUID EXCHANGE LEFT EYE;  Surgeon: Hurman Horn, MD;  Location: [place];  Service: Ophthalmology;  Laterality: Left;  . BACK SURGERY    . CESAREAN SECTION     x 2  . EYE SURGERY    .  INJECTION OF SILICONE OIL Right 08/19/8414   Procedure: INJECTION OF SILICONE OIL;  Surgeon: Hurman Horn, MD;  Location: [place];  Service: Ophthalmology;  Laterality: Right;  . INJECTION OF SILICONE OIL Left 09/07/3014   Procedure: INJECTION OF SILICONE OIL;  Surgeon: Hurman Horn, MD;  Location: [place];  Service: Ophthalmology;  Laterality: Left;  . LASER PHOTO ABLATION Right 05/26/2014   Procedure: LASER PHOTO ABLATION;  Surgeon: Hurman Horn, MD;  Location: [place];  Service: Ophthalmology;  Laterality: Right;  . PARS PLANA VITRECTOMY Right 05/26/2014   Procedure: PARS PLANA VITRECTOMY 25 GAUGE;  Surgeon: Hurman Horn, MD;  Location: [place];  Service: Ophthalmology;  Laterality: Right;  . PARS PLANA VITRECTOMY Left 11/30/2014   Procedure: PARS PLANA VITRECTOMY WITH 25 GAUGE with endolaser;  Surgeon: Hurman Horn, MD;  Location: [place];  Service: Ophthalmology;  Laterality: Left;  . PERIPHERAL VASCULAR INTERVENTION Left 03/31/2020   Procedure: PERIPHERAL VASCULAR INTERVENTION;  Surgeon: Cherre Robins, MD;  Location: [place] [facility];  Service: Cardiovascular;  Laterality: Left;  superficial femoral  . TONSILLECTOMY    . TOTAL HIP ARTHROPLASTY Right 04/21/2014   dr Percell Miller  . TOTAL HIP ARTHROPLASTY Right 04/21/2014   Procedure: RIGHT TOTAL HIP ARTHROPLASTY ANTERIOR APPROACH;  Surgeon: Renette Butters, MD;  Location: [place];  Service: Orthopedics;  Laterality: Right;  OB History   No obstetric history on file.     Family History  Problem Relation Age of Onset  . Healthy Mother   . Healthy Father     Social History   Tobacco Use  . Smoking status: Never Smoker  . Smokeless tobacco: Never Used  Vaping Use  . Vaping Use: Never used  Substance Use Topics  . Alcohol use: No  . Drug use: No    Home Medications Prior to Admission medications   Medication Sig Start Date End Date Taking? Authorizing Provider  acetaminophen (TYLENOL) 500 MG tablet Take 1,000 mg by mouth every 6  (six) hours as needed.    [provider]  aspirin EC 81 MG tablet Take 1 tablet (81 mg total) by mouth daily. Swallow whole. 03/31/20 03/31/21  Cherre Robins, MD  atorvastatin (LIPITOR) 10 MG tablet Take 10 mg by mouth daily.    [provider]  b complex vitamins tablet Take 1 tablet by mouth daily.    [provider]  clopidogrel (PLAVIX) 75 MG tablet Take 1 tablet (75 mg total) by mouth daily. 03/31/20 03/31/21  Cherre Robins, MD  glipiZIDE (GLUCOTROL) 10 MG tablet Take 10 mg by mouth 2 (two) times daily. 03/01/20   [provider]  Glucos-Chond-Hyal Ac-Ca Fructo (MOVE FREE JOINT HEALTH ADVANCE PO) Take 1 tablet by mouth daily.    [provider]  glucose 4 GM chewable tablet Chew 1 tablet by mouth as needed for low blood sugar. Patient not taking: Reported on 05/04/2020    [provider]  lisinopril-hydrochlorothiazide (ZESTORETIC) 20-12.5 MG tablet Take 2 tablets by mouth daily. 03/01/20   [provider]  Multiple Vitamins-Minerals (MULTIVITAMIN PO) Take 1 tablet by mouth daily.    [provider]  Multiple Vitamins-Minerals (OCUVITE ADULT 50+ PO) Take 1 tablet by mouth daily.    [provider]  Omega 3-6-9 Fatty Acids (OMEGA 3-6-9 COMPLEX PO) Take 1 capsule by mouth 2 (two) times daily.    [provider]  sitaGLIPtin-metformin (JANUMET) 50-1000 MG per tablet Take 1 tablet by mouth 2 (two) times daily with a meal.    [provider]    Allergies    Anesthetics, amide and Codeine  Review of Systems   Review of Systems  Constitutional: Negative for appetite change and fatigue.  HENT: Negative for congestion, ear discharge and sinus pressure.   Eyes: Negative for discharge.  Respiratory: Negative for cough.   Cardiovascular: Negative for chest pain.  Gastrointestinal: Negative for abdominal pain and diarrhea.  Genitourinary: Negative for frequency and hematuria.  Musculoskeletal:  Negative for back pain.       Swelling redness to left foot  Skin: Negative for rash.  Neurological: Negative for seizures and headaches.  Psychiatric/Behavioral: Negative for hallucinations.    Physical Exam Updated Vital Signs BP (!) 133/44   Pulse 92   Temp 98.8 F (37.1 C) (Oral)   Resp 19   Ht 5\' 6"  (1.676 m)   Wt 85.3 kg   SpO2 100%   BMI 30.34 kg/m   Physical Exam Vitals reviewed.  Constitutional:      Appearance: She is well-developed.  HENT:     Head: Normocephalic.     Nose: Nose normal.  Eyes:     General: No scleral icterus.    Extraocular Movements: EOM normal.     Conjunctiva/sclera: Conjunctivae normal.  Neck:     Thyroid: No thyromegaly.  Cardiovascular:  Rate and Rhythm: Normal rate and regular rhythm.     Heart sounds: No murmur heard. No friction rub. No gallop.   Pulmonary:     Breath sounds: No stridor. No wheezing or rales.  Chest:     Chest wall: No tenderness.  Abdominal:     General: There is no distension.     Tenderness: There is no abdominal tenderness. There is no rebound.  Musculoskeletal:        General: No edema. Normal range of motion.     Cervical back: Neck supple.     Comments: Patient has an ulcer to left great toe swelling cellulitis changes decreased mentation.  Lymphadenopathy:     Cervical: No cervical adenopathy.  Skin:    Findings: No erythema or rash.  Neurological:     Mental Status: She is oriented to person, place, and time.     Motor: No abnormal muscle tone.     Coordination: Coordination normal.  Psychiatric:        Mood and Affect: Mood and affect normal.        Behavior: Behavior normal.     ED Results / Procedures / Treatments   Labs (all labs ordered are listed, but only abnormal results are displayed) Labs Reviewed  COMPREHENSIVE METABOLIC PANEL - Abnormal; Notable for the following components:      Result Value   Glucose, Bld 173 (*)    All other components within normal limits  RESP PANEL  BY RT-PCR (FLU A&B, COVID) [id number]  CBC WITH DIFFERENTIAL/PLATELET    EKG None  Radiology DG Foot Complete Left  Result Date: 06/03/2020 CLINICAL DATA:  Diabetic sore about the great toe. EXAM: LEFT FOOT - COMPLETE 3+ VIEW COMPARISON:  None. FINDINGS: There is soft tissue swelling about the first digit with erosions involving the distal phalanx of the first digit. There is no displaced fracture. There are degenerative changes of the midfoot. A moderate-sized plantar calcaneal spur is noted. There is an Achilles tendon enthesophyte. Vascular calcifications are noted. IMPRESSION: 1. Findings concerning for osteomyelitis involving the distal phalanx of the first digit. 2. Soft tissue swelling about the first digit. 3. Degenerative changes of the midfoot. 4. Moderate-sized plantar calcaneal spur. These results will be called to the ordering clinician or representative by the Radiologist Assistant, and communication documented in the PACS or [employer]. Electronically Signed   By: Constance Holster M.D.   On: 06/03/2020 00:04    Procedures Procedures   Medications Ordered in ED Medications  cefTRIAXone (ROCEPHIN) 2 g in sodium chloride 0.9 % 100 mL IVPB (0 g Intravenous Stopped 06/03/20 1827)    ED Course  I have reviewed the triage vital signs and the nursing notes.  Pertinent labs & imaging results that were available during my care of the patient were reviewed by me and considered in my medical decision making (see chart for details).    MDM Rules/Calculators/A&P                          Possible osteomyelitis of the left great toe. Patient will be started on antibiotics admitted to medicine Final Clinical Impression(s) / ED Diagnoses Final diagnoses:  Cellulitis of left foot    Rx / DC Orders ED Discharge Orders    None       Milton Ferguson, MD 06/03/20 1839

## 2020-06-03 NOTE — H&P (Signed)
History and Physical    [name] HEN:[id number] DOB: April 03, 1956 DOA: 06/03/2020  PCP: Lindsay Squibb, MD (Confirm with patient/family/NH records and if not entered, this has to be entered at [facility] point of entry) Patient coming from: home  I have personally briefly reviewed patient's old medical records in [place]  Chief Complaint: ulerated lesion left great toe  HPI: [name] is a 65 y.o. female with medical history significant of DM, HLD, HTN PVD s/p SFA stenting, popliteal stenting 03/31/20 Dr. Stanford Breed. She has had a ulcerated lesion left great toe since December, reportedly doing better 05/04/20 when seen or vascular f/u. She saw her PCP 06/02/20 for persistent left great toe ulceration. Plain film revealed possible osteomyelitis distal phalanx left great toe and she was subsequently referred to [facility] for further evaluation. She denies fever, rigors or other systemic symptoms.    ED Course: T 98.8  133/44  HR 92, RR 19. EDP exam notable for ulcer left great toe, swelling and "cellulitis" changes left foot. Lab with glucose 173, WBC 8.4. Ceftriaxone administered. TRH called to admit for continued treatment.  Review of Systems: As per HPI otherwise 10 point review of systems negative.    Past Medical History:  Diagnosis Date  . Arthritis    OSTEO  . Diabetes mellitus without complication ([place])    TYPE 2  . Family history of adverse reaction to anesthesia    Mother - N/V  . Goiter   . Hyperlipidemia   . Hypertension   . PONV (postoperative nausea and vomiting)   . Retinopathy     Past Surgical History:  Procedure Laterality Date  . ABDOMINAL AORTOGRAM W/LOWER EXTREMITY N/A 03/31/2020   Procedure: ABDOMINAL AORTOGRAM W/LOWER EXTREMITY;  Surgeon: Cherre Robins, MD;  Location: [place] [facility];  Service: Cardiovascular;  Laterality: N/A;  . ABDOMINAL HYSTERECTOMY    . AIR/FLUID EXCHANGE Left 11/30/2014   Procedure: AIR/FLUID EXCHANGE LEFT EYE;  Surgeon: Hurman Horn, MD;  Location: [place];  Service: Ophthalmology;  Laterality: Left;  . BACK SURGERY    . CESAREAN SECTION     x 2  . EYE SURGERY    . INJECTION OF SILICONE OIL Right 3/61/4431   Procedure: INJECTION OF SILICONE OIL;  Surgeon: Hurman Horn, MD;  Location: [place];  Service: Ophthalmology;  Laterality: Right;  . INJECTION OF SILICONE OIL Left 5/40/0867   Procedure: INJECTION OF SILICONE OIL;  Surgeon: Hurman Horn, MD;  Location: [place];  Service: Ophthalmology;  Laterality: Left;  . LASER PHOTO ABLATION Right 05/26/2014   Procedure: LASER PHOTO ABLATION;  Surgeon: Hurman Horn, MD;  Location: [place];  Service: Ophthalmology;  Laterality: Right;  . PARS PLANA VITRECTOMY Right 05/26/2014   Procedure: PARS PLANA VITRECTOMY 25 GAUGE;  Surgeon: Hurman Horn, MD;  Location: [place];  Service: Ophthalmology;  Laterality: Right;  . PARS PLANA VITRECTOMY Left 11/30/2014   Procedure: PARS PLANA VITRECTOMY WITH 25 GAUGE with endolaser;  Surgeon: Hurman Horn, MD;  Location: [place];  Service: Ophthalmology;  Laterality: Left;  . PERIPHERAL VASCULAR INTERVENTION Left 03/31/2020   Procedure: PERIPHERAL VASCULAR INTERVENTION;  Surgeon: Cherre Robins, MD;  Location: [place] [facility];  Service: Cardiovascular;  Laterality: Left;  superficial femoral  . TONSILLECTOMY    . TOTAL HIP ARTHROPLASTY Right 04/21/2014   dr Percell Miller  . TOTAL HIP ARTHROPLASTY Right 04/21/2014   Procedure: RIGHT TOTAL HIP ARTHROPLASTY ANTERIOR APPROACH;  Surgeon: Christia Reading  Maryla Morrow, MD;  Location: [place];  Service: Orthopedics;  Laterality: Right;    Soc Hx - married 30 years - anniversary this weekend, two sons, 7 grandchildren. LIves with her husband, [employer]. 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   reports that she has never smoked. She has never used smokeless tobacco. She reports that she does not drink alcohol and does not use drugs.  Allergies  Allergen Reactions  . Anesthetics, Amide Nausea And Vomiting  .  Codeine Nausea And Vomiting    Family History  Problem Relation Age of Onset  . Healthy Mother   . Healthy Father      Prior to Admission medications   Medication Sig Start Date End Date Taking? Authorizing Provider  acetaminophen (TYLENOL) 500 MG tablet Take 1,000 mg by mouth every 6 (six) hours as needed.    [provider]  aspirin EC 81 MG tablet Take 1 tablet (81 mg total) by mouth daily. Swallow whole. 03/31/20 03/31/21  Cherre Robins, MD  atorvastatin (LIPITOR) 10 MG tablet Take 10 mg by mouth daily.    [provider]  b complex vitamins tablet Take 1 tablet by mouth daily.    [provider]  clopidogrel (PLAVIX) 75 MG tablet Take 1 tablet (75 mg total) by mouth daily. 03/31/20 03/31/21  Cherre Robins, MD  glipiZIDE (GLUCOTROL) 10 MG tablet Take 10 mg by mouth 2 (two) times daily. 03/01/20   [provider]  Glucos-Chond-Hyal Ac-Ca Fructo (MOVE FREE JOINT HEALTH ADVANCE PO) Take 1 tablet by mouth daily.    [provider]  glucose 4 GM chewable tablet Chew 1 tablet by mouth as needed for low blood sugar. Patient not taking: Reported on 05/04/2020    [provider]  lisinopril-hydrochlorothiazide (ZESTORETIC) 20-12.5 MG tablet Take 2 tablets by mouth daily. 03/01/20   [provider]  Multiple Vitamins-Minerals (MULTIVITAMIN PO) Take 1 tablet by mouth daily.    [provider]  Multiple Vitamins-Minerals (OCUVITE ADULT 50+ PO) Take 1 tablet by mouth daily.    [provider]  Omega 3-6-9 Fatty Acids (OMEGA 3-6-9 COMPLEX PO) Take 1 capsule by mouth 2 (two) times daily.    [provider]  sitaGLIPtin-metformin (JANUMET) 50-1000 MG per tablet Take 1 tablet by mouth 2 (two) times daily with a meal.    [provider]    Physical Exam: Vitals:   06/03/20 1553 06/03/20 1554 06/03/20 1700 06/03/20 1800  BP: (!) 185/87  140/61 (!) 133/44  Pulse: (!) 102  97 92  Resp: 20   19   Temp: 98.8 F (37.1 C)     TempSrc: Oral     SpO2: 99%  100% 100%  Weight:  85.3 kg    Height:  5\' 6"  (1.676 m)       Vitals:   06/03/20 1553 06/03/20 1554 06/03/20 1700 06/03/20 1800  BP: (!) 185/87  140/61 (!) 133/44  Pulse: (!) 102  97 92  Resp: 20   19  Temp: 98.8 F (37.1 C)     TempSrc: Oral     SpO2: 99%  100% 100%  Weight:  85.3 kg    Height:  5\' 6"  (1.676 m)     General: WNWD woman in no distress Eyes: PERRL, lids and conjunctivae normal ENMT: Mucous membranes are moist. Posterior pharynx clear of any exudate or lesions.Normal dentition.  Neck: normal, supple, no masses, no thyromegaly Respiratory: clear to auscultation bilaterally, no  wheezing, no crackles. Normal respiratory effort. No accessory muscle use.  Cardiovascular: Regular rate and rhythm, no murmurs / rubs / gallops. No extremity edema. 2+ pedal pulses. No carotid bruits.  Abdomen: overweight, no tenderness, no masses palpated. No hepatosplenomegaly. Bowel sounds positive.  Musculoskeletal: no clubbing / cyanosis. No joint deformity upper extremities. Mild hammer toe deformities both feet. Left great toe swollen. Good ROM, no contractures. Normal muscle tone.  Skin: no rashes, lesions. Left great toe with planter ulceration w/ crusting, erythema that extends to the whole toe and distal foot. Neurologic: CN 2-12 grossly intact. Sensation intact to light touch both feet.  Strength 5/5 in all 4.  Psychiatric: Normal judgment and insight. Alert and oriented x 3. Normal mood.     Labs on Admission: I have personally reviewed following labs and imaging studies  CBC: Recent Labs  Lab 06/03/20 1712  WBC 8.4  NEUTROABS 5.8  HGB 12.5  HCT 40.3  MCV 89.4  PLT 177   Basic Metabolic Panel: Recent Labs  Lab 06/03/20 1712  NA 138  K 4.5  CL 103  CO2 24  GLUCOSE 173*  BUN 19  CREATININE 0.67  CALCIUM 9.7   GFR: Estimated Creatinine Clearance: 77.1 mL/min (by C-G formula based on SCr of 0.67  mg/dL). Liver Function Tests: Recent Labs  Lab 06/03/20 1712  AST 16  ALT 20  ALKPHOS 69  BILITOT 0.6  PROT 7.0  ALBUMIN 3.6   No results for input(s): LIPASE, AMYLASE in the last 168 hours. No results for input(s): AMMONIA in the last 168 hours. Coagulation Profile: No results for input(s): INR, PROTIME in the last 168 hours. Cardiac Enzymes: No results for input(s): CKTOTAL, CKMB, CKMBINDEX, TROPONINI in the last 168 hours. BNP (last 3 results) No results for input(s): PROBNP in the last 8760 hours. HbA1C: No results for input(s): HGBA1C in the last 72 hours. CBG: No results for input(s): GLUCAP in the last 168 hours. Lipid Profile: No results for input(s): CHOL, HDL, LDLCALC, TRIG, CHOLHDL, LDLDIRECT in the last 72 hours. Thyroid Function Tests: No results for input(s): TSH, T4TOTAL, FREET4, T3FREE, THYROIDAB in the last 72 hours. Anemia Panel: No results for input(s): VITAMINB12, FOLATE, FERRITIN, TIBC, IRON, RETICCTPCT in the last 72 hours. Urine analysis:    Component Value Date/Time   COLORURINE YELLOW 04/10/2014 1332   APPEARANCEUR HAZY (A) 04/10/2014 1332   LABSPEC 1.019 04/10/2014 1332   PHURINE 5.5 04/10/2014 1332   GLUCOSEU NEGATIVE 04/10/2014 1332   HGBUR NEGATIVE 04/10/2014 1332   BILIRUBINUR NEGATIVE 04/10/2014 1332   KETONESUR 15 (A) 04/10/2014 1332   PROTEINUR NEGATIVE 04/10/2014 1332   UROBILINOGEN 0.2 04/10/2014 1332   NITRITE POSITIVE (A) 04/10/2014 1332   LEUKOCYTESUR SMALL (A) 04/10/2014 1332    Radiological Exams on Admission: DG Foot Complete Left  Result Date: 06/03/2020 CLINICAL DATA:  Diabetic sore about the great toe. EXAM: LEFT FOOT - COMPLETE 3+ VIEW COMPARISON:  None. FINDINGS: There is soft tissue swelling about the first digit with erosions involving the distal phalanx of the first digit. There is no displaced fracture. There are degenerative changes of the midfoot. A moderate-sized plantar calcaneal spur is noted. There is an Achilles  tendon enthesophyte. Vascular calcifications are noted. IMPRESSION: 1. Findings concerning for osteomyelitis involving the distal phalanx of the first digit. 2. Soft tissue swelling about the first digit. 3. Degenerative changes of the midfoot. 4. Moderate-sized plantar calcaneal spur. These results will be called to the ordering clinician or representative by the  Psychologist, clinical, and communication documented in the PACS or [employer]. Electronically Signed   By: Constance Holster M.D.   On: 06/03/2020 00:04    EKG: Independently reviewed. 03/31/20 - NSR, old inferior and anterior injury.  Assessment/Plan Active Problems:   DM (diabetes mellitus), type 2 with peripheral vascular complications (HCC)   Osteomyelitis of great toe of left foot (H[facility])   Cellulitis of foot, left   HTN (hypertension)   HLD (hyperlipidemia)    1. Osteomyelitis - left great toe,distal phalanx by plain x-ray. Plan MRI foot - in AM  Continue ceftriaxone 1g daily  Add vancomycin - pharmacy to consult  May need ID consult re duration of abx vs vascular surgery   2. Cellulitis left foot - Tx per #1  3. DM - patient reports last A1C in October '21 was 7.1% Plan A1C  Continue home regimen  Sliding scale coverage in light of infection  4. HTN- continue home regimen  5. HLD - continue home medication, low fat diet  DVT prophylaxis: heparin - until surgery ruled out  Code Status: full code  Family Communication: patient prefers to call husband herself.  Disposition Plan: TBD  Consults called: none (with names) Admission status: inpatient-medical floor    Adella Hare MD [facility] Pager [phone]  If 7PM-7AM, please contact night-coverage [email or web address] Password [email or web address]  06/03/2020, 6:50 PM

## 2020-06-03 NOTE — Progress Notes (Signed)
Pharmacy Antibiotic Note  [name] is a 65 y.o. female, diabetic, with pain and swelling in her left foot and toe. She was sent to the ED by her doctor who yesterday took an x-ray and the results show possible osteomyelitis to the large left toe.  Pharmacy has been consulted for vancomycin dosing.  Plan: Vancomycin 1750 mg IV load, then 1000 mg IV  q 12 hr.  Calc AUC= 519.7 Will follow levels/dosing, renal function, clinical status.   Height: 5\' 6"  (167.6 cm) Weight: 85.3 kg (188 lb) IBW/kg (Calculated) : 59.3  Temp (24hrs), Avg:98.8 F (37.1 C), Min:98.8 F (37.1 C), Max:98.8 F (37.1 C)  Recent Labs  Lab 06/03/20 1712  WBC 8.4  CREATININE 0.67    Estimated Creatinine Clearance: 77.1 mL/min (by C-G formula based on SCr of 0.67 mg/dL).    Allergies  Allergen Reactions  . Codeine Nausea And Vomiting  . Anesthetics, Amide Nausea And Vomiting    Antimicrobials this admission: Vancomycin 3/3 >>    Thank you for allowing pharmacy to be a part of this patient's care.  Blenda Nicely, RPh Clinical Pharmacist Pharmacist directory can now be found in [place] listed under [place]. 06/03/2020 7:40 PM

## 2020-06-03 NOTE — ED Triage Notes (Signed)
Pt c/o left big toe pain. Pt states her pcp told her that she has infection to the bone and that she needed to come to the ER to get IV antibiotics.

## 2020-06-04 ENCOUNTER — Inpatient Hospital Stay (HOSPITAL_COMMUNITY): Payer: Medicare Other

## 2020-06-04 DIAGNOSIS — E785 Hyperlipidemia, unspecified: Secondary | ICD-10-CM

## 2020-06-04 DIAGNOSIS — L97529 Non-pressure chronic ulcer of other part of left foot with unspecified severity: Secondary | ICD-10-CM

## 2020-06-04 DIAGNOSIS — E11621 Type 2 diabetes mellitus with foot ulcer: Secondary | ICD-10-CM

## 2020-06-04 DIAGNOSIS — E1151 Type 2 diabetes mellitus with diabetic peripheral angiopathy without gangrene: Secondary | ICD-10-CM

## 2020-06-04 DIAGNOSIS — I159 Secondary hypertension, unspecified: Secondary | ICD-10-CM

## 2020-06-04 DIAGNOSIS — M869 Osteomyelitis, unspecified: Secondary | ICD-10-CM

## 2020-06-04 LAB — HIV ANTIBODY (ROUTINE TESTING W REFLEX): HIV Screen 4th Generation wRfx: NONREACTIVE

## 2020-06-04 LAB — GLUCOSE, CAPILLARY
Glucose-Capillary: 182 mg/dL — ABNORMAL HIGH (ref 70–99)
Glucose-Capillary: 207 mg/dL — ABNORMAL HIGH (ref 70–99)
Glucose-Capillary: 73 mg/dL (ref 70–99)
Glucose-Capillary: 214 mg/dL — ABNORMAL HIGH (ref 70–99)

## 2020-06-04 MED ORDER — COLLAGENASE 250 UNIT/GM EX OINT
TOPICAL_OINTMENT | Freq: Two times a day (BID) | CUTANEOUS | Status: DC
  Filled 2020-06-04: qty 30

## 2020-06-04 MED ORDER — GADOBUTROL 1 MMOL/ML IV SOLN
8.0000 mL | Freq: Once | INTRAVENOUS | Status: AC | PRN
  Administered 2020-06-04: 8 mL via INTRAVENOUS

## 2020-06-04 NOTE — Progress Notes (Signed)
PROGRESS NOTE    [name]  DQQ:[id number] DOB: 26-Dec-1955 DOA: 06/03/2020 PCP: Celene Squibb, MD    Brief Narrative:  65 year old female with a history of hypertension, diabetes, admitted with diabetic foot wound on her left great toe with underlying osteomyelitis.  Started on IV antibiotics.   Assessment & Plan:   Active Problems:   DM (diabetes mellitus), type 2 with peripheral vascular complications (HCC)   HLD (hyperlipidemia)   HTN (hypertension)   Osteomyelitis of great toe of left foot (HCC)   Cellulitis of left foot   Diabetic ulcer of left great toe (HCC)   Diabetic foot ulcer left great toe with underlying osteomyelitis -Appreciate general surgery input -She is on IV antibiotics -Per patient request, case reviewed with Dr. Sharol Given, orthopedics in [place].  He felt it was reasonable to have the patient follow-up with him next week to discuss operative management. -She will need to discharge home with antibiotics to continue infection for now. -I suspect that she will require amputation of her toe.  Diabetes -Currently on Tradjenta/Metformin, glipizide, sliding scale insulin -Blood sugars have been stable  Hypertension -Continue on lisinopril, hydrochlorothiazide -Blood pressure has been stable  Peripheral vascular disease -Recent revascularization/stent placement of left SFA -Updated patient's vascular surgeon, Dr. Stanford Breed -ABIs rechecked today and consistent with values from approximately a month ago that show adequate blood flow -Continue on Plavix  Hyperlipidemia -Continue statin  DVT prophylaxis: heparin injection 5,000 Units Start: 06/03/20 2200  Code Status: Full code Family Communication: Discussed with patient Disposition Plan: Status is: Inpatient  Remains inpatient appropriate because:Inpatient level of care appropriate due to severity of illness   Dispo: The patient is from: Home              Anticipated d/c is to: Home               Patient currently is not medically stable to d/c.   Difficult to place patient No         Consultants:   General surgery  Procedures:   Bedside debridement of left great toe by Dr. Constance Haw  Antimicrobials:   Vancomycin 3/3>   Subjective: Denies any pain in her foot.  No chest pain, shortness of breath  Objective: Vitals:   06/04/20 0217 06/04/20 0531 06/04/20 1012 06/04/20 1331  BP: 132/73 (!) 148/78 (!) 168/76 (!) 155/77  Pulse: 91 90 90 95  Resp: 16 16 18 18   Temp: 98.2 F (36.8 C) 97.9 F (36.6 C) 98.2 F (36.8 C) 98.4 F (36.9 C)  TempSrc: Oral Oral Oral Oral  SpO2: 99% 100% 99% 98%  Weight:      Height:        Intake/Output Summary (Last 24 hours) at 06/04/2020 2009 Last data filed at 06/04/2020 1531 Gross per 24 hour  Intake 1030 ml  Output 300 ml  Net 730 ml   Filed Weights   06/03/20 1554 06/03/20 2114  Weight: 85.3 kg 83.8 kg    Examination:  General exam: Appears calm and comfortable  Respiratory system: Clear to auscultation. Respiratory effort normal. Cardiovascular system: S1 & S2 heard, RRR. No JVD, murmurs, rubs, gallops or clicks. No pedal edema. Gastrointestinal system: Abdomen is nondistended, soft and nontender. No organomegaly or masses felt. Normal bowel sounds heard. Central nervous system: Alert and oriented. No focal neurological deficits. Extremities: Symmetric 5 x 5 power. Skin: Foot ulcer as pictured below Psychiatry: Judgement and insight appear normal. Mood & affect appropriate.  Data Reviewed: I have personally reviewed following labs and imaging studies  CBC: Recent Labs  Lab 06/03/20 1712  WBC 8.4  NEUTROABS 5.8  HGB 12.5  HCT 40.3  MCV 89.4  PLT 606   Basic Metabolic Panel: Recent Labs  Lab 06/03/20 1712  NA 138  K 4.5  CL 103  CO2 24  GLUCOSE 173*  BUN 19  CREATININE 0.67  CALCIUM 9.7   GFR: Estimated Creatinine Clearance: 74.9 mL/min (by C-G formula based on SCr of 0.67  mg/dL). Liver Function Tests: Recent Labs  Lab 06/03/20 1712  AST 16  ALT 20  ALKPHOS 69  BILITOT 0.6  PROT 7.0  ALBUMIN 3.6   No results for input(s): LIPASE, AMYLASE in the last 168 hours. No results for input(s): AMMONIA in the last 168 hours. Coagulation Profile: No results for input(s): INR, PROTIME in the last 168 hours. Cardiac Enzymes: No results for input(s): CKTOTAL, CKMB, CKMBINDEX, TROPONINI in the last 168 hours. BNP (last 3 results) No results for input(s): PROBNP in the last 8760 hours. HbA1C: Recent Labs    06/03/20 1712  HGBA1C 7.2*   CBG: Recent Labs  Lab 06/03/20 2329 06/04/20 0741 06/04/20 1133 06/04/20 1546  GLUCAP 223* 182* 207* 73   Lipid Profile: No results for input(s): CHOL, HDL, LDLCALC, TRIG, CHOLHDL, LDLDIRECT in the last 72 hours. Thyroid Function Tests: No results for input(s): TSH, T4TOTAL, FREET4, T3FREE, THYROIDAB in the last 72 hours. Anemia Panel: No results for input(s): VITAMINB12, FOLATE, FERRITIN, TIBC, IRON, RETICCTPCT in the last 72 hours. Sepsis Labs: No results for input(s): PROCALCITON, LATICACIDVEN in the last 168 hours.  Recent Results (from the past 240 hour(s))  Resp Panel by RT-PCR (Flu A&B, Covid) Nasopharyngeal Swab     Status: None   Collection Time: 06/03/20  5:12 PM   Specimen: Nasopharyngeal Swab; Nasopharyngeal(NP) swabs in vial transport medium  Result Value Ref Range Status   SARS Coronavirus 2 by RT PCR NEGATIVE NEGATIVE Final    Comment: (NOTE) SARS-CoV-2 target nucleic acids are NOT DETECTED.  The SARS-CoV-2 RNA is generally detectable in upper respiratory specimens during the acute phase of infection. The lowest concentration of SARS-CoV-2 viral copies this assay can detect is 138 copies/mL. A negative result does not preclude SARS-Cov-2 infection and should not be used as the sole basis for treatment or other patient management decisions. A negative result may occur with  improper specimen  collection/handling, submission of specimen other than nasopharyngeal swab, presence of viral mutation(s) within the areas targeted by this assay, and inadequate number of viral copies(<138 copies/mL). A negative result must be combined with clinical observations, patient history, and epidemiological information. The expected result is Negative.  Fact Sheet for Patients:  EntrepreneurPulse.com.au  Fact Sheet for Healthcare Providers:  IncredibleEmployment.be  This test is no t yet approved or cleared by the [place] FDA and  has been authorized for detection and/or diagnosis of SARS-CoV-2 by FDA under an Emergency Use Authorization (EUA). This EUA will remain  in effect (meaning this test can be used) for the duration of the COVID-19 declaration under Section 564(b)(1) of the Act, 21 U.S.C.section 360bbb-3(b)(1), unless the authorization is terminated  or revoked sooner.       Influenza A by PCR NEGATIVE NEGATIVE Final   Influenza B by PCR NEGATIVE NEGATIVE Final    Comment: (NOTE) The Xpert Xpress SARS-CoV-2/FLU/RSV plus assay is intended as an aid in the diagnosis of influenza from Nasopharyngeal swab specimens and should not be  used as a sole basis for treatment. Nasal washings and aspirates are unacceptable for Xpert Xpress SARS-CoV-2/FLU/RSV testing.  Fact Sheet for Patients: EntrepreneurPulse.com.au  Fact Sheet for Healthcare Providers: IncredibleEmployment.be  This test is not yet approved or cleared by the [place] FDA and has been authorized for detection and/or diagnosis of SARS-CoV-2 by FDA under an Emergency Use Authorization (EUA). This EUA will remain in effect (meaning this test can be used) for the duration of the COVID-19 declaration under Section 564(b)(1) of the Act, 21 U.S.C. section 360bbb-3(b)(1), unless the authorization is terminated or revoked.  Performed at [facility], [address], [place], [place] [address]          Radiology Studies: MR FOOT LEFT W WO CONTRAST  Result Date: 06/04/2020 CLINICAL DATA:  Diabetic ulceration of the left great toe EXAM: MRI OF THE LEFT FOREFOOT WITHOUT AND WITH CONTRAST TECHNIQUE: Multiplanar, multisequence MR imaging of the left forefoot was performed both before and after administration of intravenous contrast. CONTRAST:  56mL GADAVIST GADOBUTROL 1 MMOL/ML IV SOLN COMPARISON:  X-ray 06/02/2020 FINDINGS: Bones/Joint/Cartilage Acute osteomyelitis of the distal phalanx of the left great toe with cortical destruction of the distal tuft. Marked bone marrow edema and enhancement throughout the great toe distal phalanx with associated confluent low T1 bone marrow signal. Trace joint effusion of the great toe interphalangeal joint suspicious for septic arthritis. Bone marrow edema within the distal aspect of the great toe proximal phalanx adjacent to the IP joint is concerning for early acute osteomyelitis. Remaining osseous structures of the forefoot are intact. No additional sites of erosion or marrow replacement. Mild degenerative changes throughout the forefoot and midfoot. Ligaments Intact Lisfranc ligament. Collateral ligaments of the forefoot appear intact. Muscles and Tendons Atrophy and fatty infiltration of the intrinsic foot musculature compatible with chronic denervation changes. Mild diffuse edema-like intramuscular signal which may reflect denervation and/or myositis. Trace flexor hallucis longus tenosynovitis. Soft tissues Soft tissue ulceration at the distal aspect of the left great toe along its medial surface. There is a peripherally enhancing sinus tract/fluid collection measuring up to 2.4 cm which appears contiguous with the great toe IP joint (series 10, images 7-8; series 9, images 5-10). Extensive surrounding cellulitis of the great toe. IMPRESSION: 1. Acute osteomyelitis of the distal phalanx of the left great  toe. Bone marrow edema within the distal aspect of the great toe proximal phalanx adjacent to the IP joint is concerning for early acute osteomyelitis. 2. Soft tissue ulceration at the distal aspect of the left great toe with sinus tract/fluid collection measuring up to 2.4 cm which appears contiguous with the great toe IP joint. Findings highly suspicious for septic arthritis. 3. Trace flexor hallucis longus tenosynovitis. 4. Diffuse edema-like intramuscular signal throughout the forefoot which may reflect denervation and/or myositis. Electronically Signed   By: Davina Poke D.O.   On: 06/04/2020 08:53   US ARTERIAL ABI (SCREENING LOWER EXTREMITY)  Result Date: 06/04/2020 CLINICAL DATA:  Left first toe ulcer. History of left lower extremity arterial revascularization on 03/31/2020 consisting of left SFA stenting and popliteal artery angioplasty. EXAM: NONINVASIVE PHYSIOLOGIC VASCULAR STUDY OF BILATERAL LOWER EXTREMITIES TECHNIQUE: Evaluation of both lower extremities were performed at rest, including calculation of ankle-brachial indices with single level Doppler, pressure and pulse volume recording. COMPARISON:  Prior noninvasive vascular study results of the lower extremities on 05/04/2020. FINDINGS: Right ABI: 0.73. This is identical to the prior resting ABI calculated on 05/04/2020. Left ABI: 0.92. This is similar and slightly  higher to the ABI calculated on 05/04/2020 of 0.83. Right Lower Extremity: Distal waveforms are monophasic with higher amplitude at the dorsalis pedis level compared to the posterior tibial artery level. Left Lower Extremity: Distal waveforms are monophasic with higher amplitude at the dorsalis pedis level compared to the posterior tibial artery level. IMPRESSION: 1. Stable resting ABI of 0.73 on the right, consistent with moderate arterial occlusive disease. 2. Stable to slightly improved resting ABI of 0.92 on the left (previously 0.83) after prior revascularization.  Electronically Signed   By: Aletta Edouard M.D.   On: 06/04/2020 09:43        Scheduled Meds: . aspirin EC  81 mg Oral Daily  . atorvastatin  10 mg Oral Daily  . clopidogrel  75 mg Oral Daily  . collagenase   Topical BID  . glipiZIDE  10 mg Oral BID WC  . heparin  5,000 Units Subcutaneous Q8H  . lisinopril  20 mg Oral Daily   And  . hydrochlorothiazide  12.5 mg Oral Daily  . insulin aspart  0-15 Units Subcutaneous TID WC  . linagliptin  5 mg Oral BID WC   And  . metFORMIN  1,000 mg Oral BID WC  . senna  1 tablet Oral BID  . sodium chloride flush  3 mL Intravenous Q12H   Continuous Infusions: . sodium chloride    . vancomycin 1,000 mg (06/04/20 1922)     LOS: 1 day    Time spent: 28 minutes    Kathie Dike, MD [facility]   If 7PM-7AM, please contact night-coverage [email or web address]  06/04/2020, 8:09 PM

## 2020-06-04 NOTE — Consult Note (Signed)
[facility] Consult  Reason for Consult: Left great toe osteo, diabetic foot ulcer  Referring Physician:  Dr. Roderic Palau   Chief Complaint    Toe Pain      HPI: [name] is a 65 y.o. female with an ulcer on her left great toe for sometime who underwent revascularization with Dr. Stanford Breed 03/2020. She has improved blood flow and has healed the wound some. She was starting to get more wound care with santyl from her PCP Dr. Nevada Crane this past week but has not started it yet. 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  She had ABI that are improved after her revascularization and MRI demonstrating osteo and possibly some septic arthritis.   She has a sister that had multiple amputations and lost her leg. She does not want to lose her toe yet but knows that the infection could worsen.  She denies other foot wounds.   Past Medical History:  Diagnosis Date  . Arthritis    OSTEO  . Diabetes mellitus without complication ([place])    TYPE 2  . Family history of adverse reaction to anesthesia    Mother - N/V  . Goiter   . Hyperlipidemia   . Hypertension   . PONV (postoperative nausea and vomiting)   . Retinopathy     Past Surgical History:  Procedure Laterality Date  . ABDOMINAL AORTOGRAM W/LOWER EXTREMITY N/A 03/31/2020   Procedure: ABDOMINAL AORTOGRAM W/LOWER EXTREMITY;  Surgeon: Cherre Robins, MD;  Location: [place] [facility];  Service: Cardiovascular;  Laterality: N/A;  . ABDOMINAL HYSTERECTOMY    . AIR/FLUID EXCHANGE Left 11/30/2014   Procedure: AIR/FLUID EXCHANGE LEFT EYE;  Surgeon: Hurman Horn, MD;  Location: [place];  Service: Ophthalmology;  Laterality: Left;  . BACK SURGERY    . CESAREAN SECTION     x 2  . EYE SURGERY    . INJECTION OF SILICONE OIL Right 06/30/5186   Procedure: INJECTION OF SILICONE OIL;  Surgeon: Hurman Horn, MD;  Location: [place];  Service: Ophthalmology;  Laterality: Right;   . INJECTION OF SILICONE OIL Left 07/17/6061   Procedure: INJECTION OF SILICONE OIL;  Surgeon: Hurman Horn, MD;  Location: [place];  Service: Ophthalmology;  Laterality: Left;  . LASER PHOTO ABLATION Right 05/26/2014   Procedure: LASER PHOTO ABLATION;  Surgeon: Hurman Horn, MD;  Location: [place];  Service: Ophthalmology;  Laterality: Right;  . PARS PLANA VITRECTOMY Right 05/26/2014   Procedure: PARS PLANA VITRECTOMY 25 GAUGE;  Surgeon: Hurman Horn, MD;  Location: [place];  Service: Ophthalmology;  Laterality: Right;  . PARS PLANA VITRECTOMY Left 11/30/2014   Procedure: PARS PLANA VITRECTOMY WITH 25 GAUGE with endolaser;  Surgeon: Hurman Horn, MD;  Location: [place];  Service: Ophthalmology;  Laterality: Left;  . PERIPHERAL VASCULAR INTERVENTION Left 03/31/2020   Procedure: PERIPHERAL VASCULAR INTERVENTION;  Surgeon: Cherre Robins, MD;  Location: [place] [facility];  Service: Cardiovascular;  Laterality: Left;  superficial femoral  . TONSILLECTOMY    . TOTAL HIP ARTHROPLASTY Right 04/21/2014   dr Percell Miller  . TOTAL HIP ARTHROPLASTY Right 04/21/2014   Procedure: RIGHT TOTAL HIP ARTHROPLASTY ANTERIOR APPROACH;  Surgeon: Renette Butters, MD;  Location: [place];  Service: Orthopedics;  Laterality: Right;    Family History  Problem Relation Age of Onset  . Healthy Mother   . Healthy Father  Social History   Tobacco Use  . Smoking status: Never Smoker  . Smokeless tobacco: Never Used  Vaping Use  . Vaping Use: Never used  Substance Use Topics  . Alcohol use: No  . Drug use: No    Medications:  I have reviewed the patient's current medications. Prior to Admission:  Medications Prior to Admission  Medication Sig Dispense Refill Last Dose  . atorvastatin (LIPITOR) 10 MG tablet Take 10 mg by mouth daily.   06/02/2020 at 2200  . clopidogrel (PLAVIX) 75 MG tablet Take 1 tablet (75 mg total) by mouth daily. 30 tablet 11 06/03/2020 at 0800  . glipiZIDE (GLUCOTROL) 10 MG tablet Take 10 mg by  mouth 2 (two) times daily.   06/02/2020 at 2200  . lisinopril-hydrochlorothiazide (ZESTORETIC) 20-12.5 MG tablet Take 2 tablets by mouth daily.   06/03/2020 at 0800  . sitaGLIPtin-metformin (JANUMET) 50-1000 MG per tablet Take 1 tablet by mouth 2 (two) times daily with a meal.   06/03/2020 at 0800  . acetaminophen (TYLENOL) 500 MG tablet Take 1,000 mg by mouth every 6 (six) hours as needed.     Marland Kitchen aspirin EC 81 MG tablet Take 1 tablet (81 mg total) by mouth daily. Swallow whole. 150 tablet 2   . b complex vitamins tablet Take 1 tablet by mouth daily.     . Glucos-Chond-Hyal Ac-Ca Fructo (MOVE FREE JOINT HEALTH ADVANCE PO) Take 1 tablet by mouth daily.     Marland Kitchen glucose 4 GM chewable tablet Chew 1 tablet by mouth as needed for low blood sugar. (Patient not taking: Reported on 05/04/2020)     . Multiple Vitamins-Minerals (MULTIVITAMIN PO) Take 1 tablet by mouth daily.     . Multiple Vitamins-Minerals (OCUVITE ADULT 50+ PO) Take 1 tablet by mouth daily.     . Omega 3-6-9 Fatty Acids (OMEGA 3-6-9 COMPLEX PO) Take 1 capsule by mouth 2 (two) times daily.      Scheduled: . aspirin EC  81 mg Oral Daily  . atorvastatin  10 mg Oral Daily  . clopidogrel  75 mg Oral Daily  . collagenase   Topical BID  . glipiZIDE  10 mg Oral BID WC  . heparin  5,000 Units Subcutaneous Q8H  . lisinopril  20 mg Oral Daily   And  . hydrochlorothiazide  12.5 mg Oral Daily  . insulin aspart  0-15 Units Subcutaneous TID WC  . linagliptin  5 mg Oral BID WC   And  . metFORMIN  1,000 mg Oral BID WC  . senna  1 tablet Oral BID  . sodium chloride flush  3 mL Intravenous Q12H   Continuous: . sodium chloride    . vancomycin 1,000 mg (06/04/20 0926)   EQA:STMHDQ chloride, acetaminophen, ketorolac, sodium chloride flush, traZODone  Allergies  Allergen Reactions  . Anesthetics, Amide Nausea And Vomiting  . Codeine Nausea And Vomiting     ROS:  A comprehensive review of systems was negative except for: Musculoskeletal: positive for  left great toe ulcer with drainage and swelling  Blood pressure (!) 155/77, pulse 95, temperature 98.4 F (36.9 C), temperature source Oral, resp. rate 18, height 5\' 5"  (1.651 m), weight 83.8 kg, SpO2 98 %. Physical Exam Vitals reviewed.  HENT:     Nose: Nose normal.  Eyes:     Extraocular Movements: Extraocular movements intact.  Cardiovascular:     Rate and Rhythm: Normal rate.     Pulses:          Dorsalis  pedis pulses are 1+ on the left side.     Comments: Faint Dp pulse on left Pulmonary:     Effort: Pulmonary effort is normal.  Abdominal:     General: There is no distension.     Palpations: Abdomen is soft.     Tenderness: There is no abdominal tenderness.  Musculoskeletal:     Right lower leg: No edema.     Left lower leg: No edema.     Comments: Left great toe with 3X2cm ulceration with granulation tissue except in the lower medial aspect, fibrinous tissue present, tracks about 1 cm deep, local sharp debridement performed and fibrinous tissue and some drainage expressed, packed with saline dampened gauze, cellulitis on the dorsum of the great toe   Skin:    General: Skin is warm.  Neurological:     General: No focal deficit present.     Mental Status: She is alert and oriented to person, place, and time.  Psychiatric:        Mood and Affect: Mood normal.        Behavior: Behavior normal.        Thought Content: Thought content normal.        Judgment: Judgment normal.      Predebridement:      Post debridement:    Results: Results for orders placed or performed during the hospital encounter of 06/03/20 (from the past 48 hour(s))  CBC with Differential/Platelet     Status: None   Collection Time: 06/03/20  5:12 PM  Result Value Ref Range   WBC 8.4 4.0 - 10.5 K/uL   RBC 4.51 3.87 - 5.11 MIL/uL   Hemoglobin 12.5 12.0 - 15.0 g/dL   HCT 40.3 36.0 - 46.0 %   MCV 89.4 80.0 - 100.0 fL   MCH 27.7 26.0 - 34.0 pg   MCHC 31.0 30.0 - 36.0 g/dL   RDW 13.3 11.5 -  15.5 %   Platelets 343 150 - 400 K/uL   nRBC 0.0 0.0 - 0.2 %   Neutrophils Relative % 70 %   Neutro Abs 5.8 1.7 - 7.7 K/uL   Lymphocytes Relative 19 %   Lymphs Abs 1.6 0.7 - 4.0 K/uL   Monocytes Relative 9 %   Monocytes Absolute 0.8 0.1 - 1.0 K/uL   Eosinophils Relative 2 %   Eosinophils Absolute 0.1 0.0 - 0.5 K/uL   Basophils Relative 0 %   Basophils Absolute 0.0 0.0 - 0.1 K/uL   Immature Granulocytes 0 %   Abs Immature Granulocytes 0.02 0.00 - 0.07 K/uL    Comment: Performed at [facility], [address], [place],  [address]  Comprehensive metabolic panel     Status: Abnormal   Collection Time: 06/03/20  5:12 PM  Result Value Ref Range   Sodium 138 135 - 145 mmol/L   Potassium 4.5 3.5 - 5.1 mmol/L   Chloride 103 98 - 111 mmol/L   CO2 24 22 - 32 mmol/L   Glucose, Bld 173 (H) 70 - 99 mg/dL    Comment: Glucose reference range applies only to samples taken after fasting for at least 8 hours.   BUN 19 8 - 23 mg/dL   Creatinine, Ser 0.67 0.44 - 1.00 mg/dL   Calcium 9.7 8.9 - 10.3 mg/dL   Total Protein 7.0 6.5 - 8.1 g/dL   Albumin 3.6 3.5 - 5.0 g/dL   AST 16 15 - 41 U/L   ALT 20 0 - 44 U/L  Alkaline Phosphatase 69 38 - 126 U/L   Total Bilirubin 0.6 0.3 - 1.2 mg/dL   GFR, Estimated >60 >60 mL/min    Comment: (NOTE) Calculated using the CKD-EPI Creatinine Equation (2021)    Anion gap 11 5 - 15    Comment: Performed at [facility], [address], [place], [place] [address]  Resp Panel by RT-PCR (Flu A&B, Covid) Nasopharyngeal Swab     Status: None   Collection Time: 06/03/20  5:12 PM   Specimen: Nasopharyngeal Swab; Nasopharyngeal(NP) swabs in vial transport medium  Result Value Ref Range   SARS Coronavirus 2 by RT PCR NEGATIVE NEGATIVE    Comment: (NOTE) SARS-CoV-2 target nucleic acids are NOT DETECTED.  The SARS-CoV-2 RNA is generally detectable in upper respiratory specimens during the acute phase of infection. The lowest concentration of SARS-CoV-2 viral copies  this assay can detect is 138 copies/mL. A negative result does not preclude SARS-Cov-2 infection and should not be used as the sole basis for treatment or other patient management decisions. A negative result may occur with  improper specimen collection/handling, submission of specimen other than nasopharyngeal swab, presence of viral mutation(s) within the areas targeted by this assay, and inadequate number of viral copies(<138 copies/mL). A negative result must be combined with clinical observations, patient history, and epidemiological information. The expected result is Negative.  Fact Sheet for Patients:  EntrepreneurPulse.com.au  Fact Sheet for Healthcare Providers:  IncredibleEmployment.be  This test is no t yet approved or cleared by the [place] FDA and  has been authorized for detection and/or diagnosis of SARS-CoV-2 by FDA under an Emergency Use Authorization (EUA). This EUA will remain  in effect (meaning this test can be used) for the duration of the COVID-19 declaration under Section 564(b)(1) of the Act, 21 U.S.C.section 360bbb-3(b)(1), unless the authorization is terminated  or revoked sooner.       Influenza A by PCR NEGATIVE NEGATIVE   Influenza B by PCR NEGATIVE NEGATIVE    Comment: (NOTE) The Xpert Xpress SARS-CoV-2/FLU/RSV plus assay is intended as an aid in the diagnosis of influenza from Nasopharyngeal swab specimens and should not be used as a sole basis for treatment. Nasal washings and aspirates are unacceptable for Xpert Xpress SARS-CoV-2/FLU/RSV testing.  Fact Sheet for Patients: EntrepreneurPulse.com.au  Fact Sheet for Healthcare Providers: IncredibleEmployment.be  This test is not yet approved or cleared by the [place] FDA and has been authorized for detection and/or diagnosis of SARS-CoV-2 by FDA under an Emergency Use Authorization (EUA). This EUA will  remain in effect (meaning this test can be used) for the duration of the COVID-19 declaration under Section 564(b)(1) of the Act, 21 U.S.C. section 360bbb-3(b)(1), unless the authorization is terminated or revoked.  Performed at [facility], [address], [place], [place] [address]   HIV Antibody (routine testing w rflx)     Status: None   Collection Time: 06/03/20  5:12 PM  Result Value Ref Range   HIV Screen 4th Generation wRfx Non Reactive Non Reactive    Comment: Performed at [facility], [place] [address], [place], [place] [address]  Hemoglobin A1c     Status: Abnormal   Collection Time: 06/03/20  5:12 PM  Result Value Ref Range   Hgb A1c MFr Bld 7.2 (H) 4.8 - 5.6 %    Comment: (NOTE) Pre diabetes:          5.7%-6.4%  Diabetes:              >  6.4%  Glycemic control for   <7.0% adults with diabetes    Mean Plasma Glucose 159.94 mg/dL    Comment: Performed at [facility], [place] [address] [place], [place] [address]  Glucose, capillary     Status: Abnormal   Collection Time: 06/03/20 11:29 PM  Result Value Ref Range   Glucose-Capillary 223 (H) 70 - 99 mg/dL    Comment: Glucose reference range applies only to samples taken after fasting for at least 8 hours.   Comment 1 Notify RN    Comment 2 Document in Chart   Glucose, capillary     Status: Abnormal   Collection Time: 06/04/20  7:41 AM  Result Value Ref Range   Glucose-Capillary 182 (H) 70 - 99 mg/dL    Comment: Glucose reference range applies only to samples taken after fasting for at least 8 hours.   Comment 1 Notify RN   Glucose, capillary     Status: Abnormal   Collection Time: 06/04/20 11:33 AM  Result Value Ref Range   Glucose-Capillary 207 (H) 70 - 99 mg/dL    Comment: Glucose reference range applies only to samples taken after fasting for at least 8 hours.   Comment 1 Notify RN      MR FOOT LEFT W WO CONTRAST  Result Date: 06/04/2020 CLINICAL DATA:  Diabetic ulceration of the left great toe  EXAM: MRI OF THE LEFT FOREFOOT WITHOUT AND WITH CONTRAST TECHNIQUE: Multiplanar, multisequence MR imaging of the left forefoot was performed both before and after administration of intravenous contrast. CONTRAST:  1mL GADAVIST GADOBUTROL 1 MMOL/ML IV SOLN COMPARISON:  X-ray 06/02/2020 FINDINGS: Bones/Joint/Cartilage Acute osteomyelitis of the distal phalanx of the left great toe with cortical destruction of the distal tuft. Marked bone marrow edema and enhancement throughout the great toe distal phalanx with associated confluent low T1 bone marrow signal. Trace joint effusion of the great toe interphalangeal joint suspicious for septic arthritis. Bone marrow edema within the distal aspect of the great toe proximal phalanx adjacent to the IP joint is concerning for early acute osteomyelitis. Remaining osseous structures of the forefoot are intact. No additional sites of erosion or marrow replacement. Mild degenerative changes throughout the forefoot and midfoot. Ligaments Intact Lisfranc ligament. Collateral ligaments of the forefoot appear intact. Muscles and Tendons Atrophy and fatty infiltration of the intrinsic foot musculature compatible with chronic denervation changes. Mild diffuse edema-like intramuscular signal which may reflect denervation and/or myositis. Trace flexor hallucis longus tenosynovitis. Soft tissues Soft tissue ulceration at the distal aspect of the left great toe along its medial surface. There is a peripherally enhancing sinus tract/fluid collection measuring up to 2.4 cm which appears contiguous with the great toe IP joint (series 10, images 7-8; series 9, images 5-10). Extensive surrounding cellulitis of the great toe. IMPRESSION: 1. Acute osteomyelitis of the distal phalanx of the left great toe. Bone marrow edema within the distal aspect of the great toe proximal phalanx adjacent to the IP joint is concerning for early acute osteomyelitis. 2. Soft tissue ulceration at the distal aspect of  the left great toe with sinus tract/fluid collection measuring up to 2.4 cm which appears contiguous with the great toe IP joint. Findings highly suspicious for septic arthritis. 3. Trace flexor hallucis longus tenosynovitis. 4. Diffuse edema-like intramuscular signal throughout the forefoot which may reflect denervation and/or myositis. Electronically Signed   By: Davina Poke D.O.   On: 06/04/2020 08:53   US ARTERIAL ABI (SCREENING LOWER EXTREMITY)  Result Date:  06/04/2020 CLINICAL DATA:  Left first toe ulcer. History of left lower extremity arterial revascularization on 03/31/2020 consisting of left SFA stenting and popliteal artery angioplasty. EXAM: NONINVASIVE PHYSIOLOGIC VASCULAR STUDY OF BILATERAL LOWER EXTREMITIES TECHNIQUE: Evaluation of both lower extremities were performed at rest, including calculation of ankle-brachial indices with single level Doppler, pressure and pulse volume recording. COMPARISON:  Prior noninvasive vascular study results of the lower extremities on 05/04/2020. FINDINGS: Right ABI: 0.73. This is identical to the prior resting ABI calculated on 05/04/2020. Left ABI: 0.92. This is similar and slightly higher to the ABI calculated on 05/04/2020 of 0.83. Right Lower Extremity: Distal waveforms are monophasic with higher amplitude at the dorsalis pedis level compared to the posterior tibial artery level. Left Lower Extremity: Distal waveforms are monophasic with higher amplitude at the dorsalis pedis level compared to the posterior tibial artery level. IMPRESSION: 1. Stable resting ABI of 0.73 on the right, consistent with moderate arterial occlusive disease. 2. Stable to slightly improved resting ABI of 0.92 on the left (previously 0.83) after prior revascularization. Electronically Signed   By: Aletta Edouard M.D.   On: 06/04/2020 09:43     Assessment & Plan:  [name] is a 65 y.o. female with osteo of the left great toe and possible joint involvement with septic  arthritis. I have done some local debridement and some drainage was expressed. Wound care started with santyl and saline dampened gauze. Discussed infection with the patient and that with the bone infection she may not heal and that amputation is a major possibility. Discussed that she has been revascularized and that this was good. She hopes that she can heal the wound with IV antibiotics and wound care.  She knows that amputation could be in her future but is not ready to accept that at this time.   -Santyl with saline dampened gauze BID, wrap with kerlix  -Revascularization already completed 03/2020  -Patient has not seen Dr. Sharol Given but her sister did and she wants to see him regarding the possibility of saying her toe -IV antibiotics for osteomyelitis   Discussed with Dr. Roderic Palau, will determine if she can be see by Dr. Sharol Given as an outpatient versus while she is inpatient.   All questions were answered to the satisfaction of the patient and family.    Virl Cagey 06/04/2020, 2:24 PM

## 2020-06-04 NOTE — Care Management Important Message (Signed)
Important Message  Patient Details  Name: [name] MRN: [id number] Date of Birth: 1955-09-13   Medicare Important Message Given:  Yes     Tommy Medal 06/04/2020, 2:22 PM

## 2020-06-04 NOTE — Progress Notes (Addendum)
Santyl applied to wound, covered with 4x4 gauze..Drsg Dry and intact.

## 2020-06-05 DIAGNOSIS — L03116 Cellulitis of left lower limb: Secondary | ICD-10-CM

## 2020-06-05 LAB — GLUCOSE, CAPILLARY
Glucose-Capillary: 171 mg/dL — ABNORMAL HIGH (ref 70–99)
Glucose-Capillary: 157 mg/dL — ABNORMAL HIGH (ref 70–99)

## 2020-06-05 MED ORDER — DOXYCYCLINE HYCLATE 100 MG PO TABS: 100.0000 mg | ORAL_TABLET | Freq: Two times a day (BID) | ORAL | 0 refills | Status: DC

## 2020-06-05 NOTE — Progress Notes (Signed)
IV removed, discharge instructions explained. Pt verbalized understanding.

## 2020-06-05 NOTE — Discharge Summary (Signed)
Physician Discharge Summary  [name] QBH:[id number] DOB: June 15, 1955 DOA: 06/03/2020  PCP: Celene Squibb, MD  Admit date: 06/03/2020 Discharge date: 06/05/2020  Admitted From: Home Disposition: Home  Recommendations for Outpatient Follow-up:  1. Follow up with PCP in 1-2 weeks 2. Please obtain BMP/CBC in one week 3. Patient will plan to follow-up with Dr. Sharol Given next week to discuss further operative management of osteomyelitis   Discharge Condition: Stable CODE STATUS: Full code Diet recommendation: Heart healthy  Brief/Interim Summary: 65 year old female with a history of hypertension, diabetes, admitted with diabetic foot wound on her left great toe with underlying osteomyelitis.  Started on IV antibiotics.  Discharge Diagnoses:  Active Problems:   DM (diabetes mellitus), type 2 with peripheral vascular complications (HCC)   HLD (hyperlipidemia)   HTN (hypertension)   Osteomyelitis of great toe of left foot (HCC)   Cellulitis of left foot   Diabetic ulcer of left great toe (HCC)  Diabetic foot ulcer left great toe with underlying osteomyelitis -Appreciate general surgery input -She was treated with IV antibiotics -Per patient request, case reviewed with Dr. Sharol Given, orthopedics in [place].  He felt it was reasonable to have the patient follow-up with him next week to discuss operative management. -She will need to discharge home with antibiotics to contain infection for now. -I suspect that she will require amputation of her toe. -She will be discharged on doxycycline  Diabetes -Currently on Tradjenta/Metformin, glipizide, sliding scale insulin -Blood sugars have been stable  Hypertension -Continue on lisinopril, hydrochlorothiazide -Blood pressure has been stable  Peripheral vascular disease -Recent revascularization/stent placement of left SFA -Updated patient's vascular surgeon, Dr. Stanford Breed -ABIs rechecked today and consistent with values from approximately a  month ago that show adequate blood flow -Continue on Plavix  Hyperlipidemia -Continue statin  Discharge Instructions  Discharge Instructions    Diet - low sodium heart healthy   Complete by: As directed    Discharge wound care:   Complete by: As directed    Apply santyl to left great toe ulcer bed and pack with 2x2 saline dampened gauze, cover with  gauze and kerlix twice a day   Increase activity slowly   Complete by: As directed      Allergies as of 06/05/2020      Reactions   Anesthetics, Amide Nausea And Vomiting   Codeine Nausea And Vomiting      Medication List    TAKE these medications   acetaminophen 500 MG tablet Commonly known as: TYLENOL Take 1,000 mg by mouth every 6 (six) hours as needed.   aspirin EC 81 MG tablet Take 1 tablet (81 mg total) by mouth daily. Swallow whole.   atorvastatin 10 MG tablet Commonly known as: LIPITOR Take 10 mg by mouth daily.   b complex vitamins tablet Take 1 tablet by mouth daily.   clopidogrel 75 MG tablet Commonly known as: Plavix Take 1 tablet (75 mg total) by mouth daily.   doxycycline 100 MG tablet Commonly known as: VIBRA-TABS Take 1 tablet (100 mg total) by mouth 2 (two) times daily.   glipiZIDE 10 MG tablet Commonly known as: GLUCOTROL Take 10 mg by mouth 2 (two) times daily.   glucose 4 GM chewable tablet Chew 1 tablet by mouth as needed for low blood sugar.   lisinopril-hydrochlorothiazide 20-12.5 MG tablet Commonly known as: ZESTORETIC Take 2 tablets by mouth daily.   MOVE FREE JOINT HEALTH ADVANCE PO Take 1 tablet by mouth daily.   MULTIVITAMIN PO Take  1 tablet by mouth daily.   OCUVITE ADULT 50+ PO Take 1 tablet by mouth daily.   OMEGA 3-6-9 COMPLEX PO Take 1 capsule by mouth 2 (two) times daily.   sitaGLIPtin-metformin 50-1000 MG tablet Commonly known as: JANUMET Take 1 tablet by mouth 2 (two) times daily with a meal.            Discharge Care Instructions  (From admission, onward)          Start     Ordered   06/05/20 0000  Discharge wound care:       Comments: Apply santyl to left great toe ulcer bed and pack with 2x2 saline dampened gauze, cover with  gauze and kerlix twice a day   06/05/20 1219          Follow-up Information    Newt Minion, MD Follow up.   Specialty: Orthopedic Surgery Why: call on monday to schedule an appointment later this week Contact information: [address] [place] [address] [phone]              Allergies  Allergen Reactions  . Anesthetics, Amide Nausea And Vomiting  . Codeine Nausea And Vomiting    Consultations:  General surgery  Orthopedics, Dr. Sharol Given (phone)   Procedures/Studies: MR FOOT LEFT W WO CONTRAST  Result Date: 06/04/2020 CLINICAL DATA:  Diabetic ulceration of the left great toe EXAM: MRI OF THE LEFT FOREFOOT WITHOUT AND WITH CONTRAST TECHNIQUE: Multiplanar, multisequence MR imaging of the left forefoot was performed both before and after administration of intravenous contrast. CONTRAST:  54mL GADAVIST GADOBUTROL 1 MMOL/ML IV SOLN COMPARISON:  X-ray 06/02/2020 FINDINGS: Bones/Joint/Cartilage Acute osteomyelitis of the distal phalanx of the left great toe with cortical destruction of the distal tuft. Marked bone marrow edema and enhancement throughout the great toe distal phalanx with associated confluent low T1 bone marrow signal. Trace joint effusion of the great toe interphalangeal joint suspicious for septic arthritis. Bone marrow edema within the distal aspect of the great toe proximal phalanx adjacent to the IP joint is concerning for early acute osteomyelitis. Remaining osseous structures of the forefoot are intact. No additional sites of erosion or marrow replacement. Mild degenerative changes throughout the forefoot and midfoot. Ligaments Intact Lisfranc ligament. Collateral ligaments of the forefoot appear intact. Muscles and Tendons Atrophy and fatty infiltration of the intrinsic foot  musculature compatible with chronic denervation changes. Mild diffuse edema-like intramuscular signal which may reflect denervation and/or myositis. Trace flexor hallucis longus tenosynovitis. Soft tissues Soft tissue ulceration at the distal aspect of the left great toe along its medial surface. There is a peripherally enhancing sinus tract/fluid collection measuring up to 2.4 cm which appears contiguous with the great toe IP joint (series 10, images 7-8; series 9, images 5-10). Extensive surrounding cellulitis of the great toe. IMPRESSION: 1. Acute osteomyelitis of the distal phalanx of the left great toe. Bone marrow edema within the distal aspect of the great toe proximal phalanx adjacent to the IP joint is concerning for early acute osteomyelitis. 2. Soft tissue ulceration at the distal aspect of the left great toe with sinus tract/fluid collection measuring up to 2.4 cm which appears contiguous with the great toe IP joint. Findings highly suspicious for septic arthritis. 3. Trace flexor hallucis longus tenosynovitis. 4. Diffuse edema-like intramuscular signal throughout the forefoot which may reflect denervation and/or myositis. Electronically Signed   By: Davina Poke D.O.   On: 06/04/2020 08:53   US ARTERIAL ABI (SCREENING LOWER EXTREMITY)  Result Date: 06/04/2020 CLINICAL DATA:  Left first toe ulcer. History of left lower extremity arterial revascularization on 03/31/2020 consisting of left SFA stenting and popliteal artery angioplasty. EXAM: NONINVASIVE PHYSIOLOGIC VASCULAR STUDY OF BILATERAL LOWER EXTREMITIES TECHNIQUE: Evaluation of both lower extremities were performed at rest, including calculation of ankle-brachial indices with single level Doppler, pressure and pulse volume recording. COMPARISON:  Prior noninvasive vascular study results of the lower extremities on 05/04/2020. FINDINGS: Right ABI: 0.73. This is identical to the prior resting ABI calculated on 05/04/2020. Left ABI: 0.92. This  is similar and slightly higher to the ABI calculated on 05/04/2020 of 0.83. Right Lower Extremity: Distal waveforms are monophasic with higher amplitude at the dorsalis pedis level compared to the posterior tibial artery level. Left Lower Extremity: Distal waveforms are monophasic with higher amplitude at the dorsalis pedis level compared to the posterior tibial artery level. IMPRESSION: 1. Stable resting ABI of 0.73 on the right, consistent with moderate arterial occlusive disease. 2. Stable to slightly improved resting ABI of 0.92 on the left (previously 0.83) after prior revascularization. Electronically Signed   By: Aletta Edouard M.D.   On: 06/04/2020 09:43   DG Foot Complete Left  Result Date: 06/03/2020 CLINICAL DATA:  Diabetic sore about the great toe. EXAM: LEFT FOOT - COMPLETE 3+ VIEW COMPARISON:  None. FINDINGS: There is soft tissue swelling about the first digit with erosions involving the distal phalanx of the first digit. There is no displaced fracture. There are degenerative changes of the midfoot. A moderate-sized plantar calcaneal spur is noted. There is an Achilles tendon enthesophyte. Vascular calcifications are noted. IMPRESSION: 1. Findings concerning for osteomyelitis involving the distal phalanx of the first digit. 2. Soft tissue swelling about the first digit. 3. Degenerative changes of the midfoot. 4. Moderate-sized plantar calcaneal spur. These results will be called to the ordering clinician or representative by the Radiologist Assistant, and communication documented in the PACS or [employer]. Electronically Signed   By: Constance Holster M.D.   On: 06/03/2020 00:04       Subjective: Feels better.  No complaints.  Discharge Exam: Vitals:   06/04/20 1331 06/04/20 2036 06/05/20 0510 06/05/20 0803  BP: (!) 155/77 124/63 (!) 116/59 128/78  Pulse: 95 88 88   Resp: 18 18 18    Temp: 98.4 F (36.9 C) 97.7 F (36.5 C) 98.3 F (36.8 C)   TempSrc: Oral Oral Oral   SpO2:  98% 99% 100%   Weight:      Height:        General: Pt is alert, awake, not in acute distress Cardiovascular: RRR, S1/S2 +, no rubs, no gallops Respiratory: CTA bilaterally, no wheezing, no rhonchi Abdominal: Soft, NT, ND, bowel sounds + Extremities: Large wound at plantar aspect of right great toe    The results of significant diagnostics from this hospitalization (including imaging, microbiology, ancillary and laboratory) are listed below for reference.     Microbiology: Recent Results (from the past 240 hour(s))  Resp Panel by RT-PCR (Flu A&B, Covid) Nasopharyngeal Swab     Status: None   Collection Time: 06/03/20  5:12 PM   Specimen: Nasopharyngeal Swab; Nasopharyngeal(NP) swabs in vial transport medium  Result Value Ref Range Status   SARS Coronavirus 2 by RT PCR NEGATIVE NEGATIVE Final    Comment: (NOTE) SARS-CoV-2 target nucleic acids are NOT DETECTED.  The SARS-CoV-2 RNA is generally detectable in upper respiratory specimens during the acute phase of infection. The lowest concentration of SARS-CoV-2 viral copies this  assay can detect is 138 copies/mL. A negative result does not preclude SARS-Cov-2 infection and should not be used as the sole basis for treatment or other patient management decisions. A negative result may occur with  improper specimen collection/handling, submission of specimen other than nasopharyngeal swab, presence of viral mutation(s) within the areas targeted by this assay, and inadequate number of viral copies(<138 copies/mL). A negative result must be combined with clinical observations, patient history, and epidemiological information. The expected result is Negative.  Fact Sheet for Patients:  EntrepreneurPulse.com.au  Fact Sheet for Healthcare Providers:  IncredibleEmployment.be  This test is no t yet approved or cleared by the [place] FDA and  has been authorized for detection and/or diagnosis of  SARS-CoV-2 by FDA under an Emergency Use Authorization (EUA). This EUA will remain  in effect (meaning this test can be used) for the duration of the COVID-19 declaration under Section 564(b)(1) of the Act, 21 U.S.C.section 360bbb-3(b)(1), unless the authorization is terminated  or revoked sooner.       Influenza A by PCR NEGATIVE NEGATIVE Final   Influenza B by PCR NEGATIVE NEGATIVE Final    Comment: (NOTE) The Xpert Xpress SARS-CoV-2/FLU/RSV plus assay is intended as an aid in the diagnosis of influenza from Nasopharyngeal swab specimens and should not be used as a sole basis for treatment. Nasal washings and aspirates are unacceptable for Xpert Xpress SARS-CoV-2/FLU/RSV testing.  Fact Sheet for Patients: EntrepreneurPulse.com.au  Fact Sheet for Healthcare Providers: IncredibleEmployment.be  This test is not yet approved or cleared by the [place] FDA and has been authorized for detection and/or diagnosis of SARS-CoV-2 by FDA under an Emergency Use Authorization (EUA). This EUA will remain in effect (meaning this test can be used) for the duration of the COVID-19 declaration under Section 564(b)(1) of the Act, 21 U.S.C. section 360bbb-3(b)(1), unless the authorization is terminated or revoked.  Performed at [facility], [address], [place], [place] [address]      Labs: BNP (last 3 results) No results for input(s): BNP in the last 8760 hours. Basic Metabolic Panel: Recent Labs  Lab 06/03/20 1712  NA 138  K 4.5  CL 103  CO2 24  GLUCOSE 173*  BUN 19  CREATININE 0.67  CALCIUM 9.7   Liver Function Tests: Recent Labs  Lab 06/03/20 1712  AST 16  ALT 20  ALKPHOS 69  BILITOT 0.6  PROT 7.0  ALBUMIN 3.6   No results for input(s): LIPASE, AMYLASE in the last 168 hours. No results for input(s): AMMONIA in the last 168 hours. CBC: Recent Labs  Lab 06/03/20 1712  WBC 8.4  NEUTROABS 5.8  HGB 12.5  HCT 40.3  MCV  89.4  PLT 343   Cardiac Enzymes: No results for input(s): CKTOTAL, CKMB, CKMBINDEX, TROPONINI in the last 168 hours. BNP: Invalid input(s): POCBNP CBG: Recent Labs  Lab 06/04/20 1133 06/04/20 1546 06/04/20 2036 06/05/20 0802 06/05/20 1204  GLUCAP 207* 73 214* 171* 157*   D-Dimer No results for input(s): DDIMER in the last 72 hours. Hgb A1c Recent Labs    06/03/20 1712  HGBA1C 7.2*   Lipid Profile No results for input(s): CHOL, HDL, LDLCALC, TRIG, CHOLHDL, LDLDIRECT in the last 72 hours. Thyroid function studies No results for input(s): TSH, T4TOTAL, T3FREE, THYROIDAB in the last 72 hours.  Invalid input(s): FREET3 Anemia work up No results for input(s): VITAMINB12, FOLATE, FERRITIN, TIBC, IRON, RETICCTPCT in the last 72 hours. Urinalysis    Component Value Date/Time   COLORURINE  YELLOW 04/10/2014 1332   APPEARANCEUR HAZY (A) 04/10/2014 1332   LABSPEC 1.019 04/10/2014 1332   PHURINE 5.5 04/10/2014 1332   GLUCOSEU NEGATIVE 04/10/2014 1332   HGBUR NEGATIVE 04/10/2014 1332   BILIRUBINUR NEGATIVE 04/10/2014 1332   KETONESUR 15 (A) 04/10/2014 1332   PROTEINUR NEGATIVE 04/10/2014 1332   UROBILINOGEN 0.2 04/10/2014 1332   NITRITE POSITIVE (A) 04/10/2014 1332   LEUKOCYTESUR SMALL (A) 04/10/2014 1332   Sepsis Labs Invalid input(s): PROCALCITONIN,  WBC,  LACTICIDVEN Microbiology Recent Results (from the past 240 hour(s))  Resp Panel by RT-PCR (Flu A&B, Covid) Nasopharyngeal Swab     Status: None   Collection Time: 06/03/20  5:12 PM   Specimen: Nasopharyngeal Swab; Nasopharyngeal(NP) swabs in vial transport medium  Result Value Ref Range Status   SARS Coronavirus 2 by RT PCR NEGATIVE NEGATIVE Final    Comment: (NOTE) SARS-CoV-2 target nucleic acids are NOT DETECTED.  The SARS-CoV-2 RNA is generally detectable in upper respiratory specimens during the acute phase of infection. The lowest concentration of SARS-CoV-2 viral copies this assay can detect is 138  copies/mL. A negative result does not preclude SARS-Cov-2 infection and should not be used as the sole basis for treatment or other patient management decisions. A negative result may occur with  improper specimen collection/handling, submission of specimen other than nasopharyngeal swab, presence of viral mutation(s) within the areas targeted by this assay, and inadequate number of viral copies(<138 copies/mL). A negative result must be combined with clinical observations, patient history, and epidemiological information. The expected result is Negative.  Fact Sheet for Patients:  EntrepreneurPulse.com.au  Fact Sheet for Healthcare Providers:  IncredibleEmployment.be  This test is no t yet approved or cleared by the [place] FDA and  has been authorized for detection and/or diagnosis of SARS-CoV-2 by FDA under an Emergency Use Authorization (EUA). This EUA will remain  in effect (meaning this test can be used) for the duration of the COVID-19 declaration under Section 564(b)(1) of the Act, 21 U.S.C.section 360bbb-3(b)(1), unless the authorization is terminated  or revoked sooner.       Influenza A by PCR NEGATIVE NEGATIVE Final   Influenza B by PCR NEGATIVE NEGATIVE Final    Comment: (NOTE) The Xpert Xpress SARS-CoV-2/FLU/RSV plus assay is intended as an aid in the diagnosis of influenza from Nasopharyngeal swab specimens and should not be used as a sole basis for treatment. Nasal washings and aspirates are unacceptable for Xpert Xpress SARS-CoV-2/FLU/RSV testing.  Fact Sheet for Patients: EntrepreneurPulse.com.au  Fact Sheet for Healthcare Providers: IncredibleEmployment.be  This test is not yet approved or cleared by the [place] FDA and has been authorized for detection and/or diagnosis of SARS-CoV-2 by FDA under an Emergency Use Authorization (EUA). This EUA will remain in effect (meaning  this test can be used) for the duration of the COVID-19 declaration under Section 564(b)(1) of the Act, 21 U.S.C. section 360bbb-3(b)(1), unless the authorization is terminated or revoked.  Performed at [facility], [address] [place], [place] [address]      Time coordinating discharge: 108mins  SIGNED:   Kathie Dike, MD  [facility] 06/05/2020, 9:02 PM   If 7PM-7AM, please contact night-coverage [email or web address]

## 2020-06-08 ENCOUNTER — Ambulatory Visit: Payer: Medicare Other | Admitting: Orthopedic Surgery

## 2020-06-08 ENCOUNTER — Encounter: Payer: Self-pay | Admitting: Orthopedic Surgery

## 2020-06-08 DIAGNOSIS — E1142 Type 2 diabetes mellitus with diabetic polyneuropathy: Secondary | ICD-10-CM

## 2020-06-08 DIAGNOSIS — M869 Osteomyelitis, unspecified: Secondary | ICD-10-CM

## 2020-06-08 DIAGNOSIS — I739 Peripheral vascular disease, unspecified: Secondary | ICD-10-CM

## 2020-06-08 NOTE — Progress Notes (Signed)
Office Visit Note   Patient: [name]           Date of Birth: 02-04-1956           MRN: [id number] Visit Date: 06/08/2020              Requested by: Celene Squibb, MD [place],  [place] [id number] PCP: Celene Squibb, MD  Chief Complaint  Patient presents with  . Left Foot - Follow-up    ER 06/03/20 GT ulcer       HPI: Patient is a 65 year old woman who is seen for initial evaluation for osteomyelitis ulceration left great toe.  Patient is status post revascularization with Dr. Luan Pulling.  She has excellent revascularization she is currently on Plavix and doxycycline and has persistent ulcer with drainage and cellulitis.  Ankle-brachial indices on March 4 showed an ABI of 0.92.  MRI scan shows osteomyelitis of the tuft of the left great toe.  Assessment & Plan: Visit Diagnoses:  1. Osteomyelitis of great toe of left foot ([place])   2. PVD (peripheral vascular disease) ([place])   3. Diabetic polyneuropathy associated with type 2 diabetes mellitus ([place])     Plan: Will plan for left great toe amputation at the MTP joint risk and benefits were discussed including risk of the wound not healing need for additional surgery.  Patient states she understands wished to proceed with outpatient surgery on Friday.  Follow-Up Instructions: Return in about 1 week (around 06/15/2020).   Ortho Exam  Patient is alert, oriented, no adenopathy, well-dressed, normal affect, normal respiratory effort. Examination patient has an excellent palpable dorsalis pedis pulse.  There is no ascending cellulitis or ulcers in the hindfoot and midfoot.  She has a large 3 cm in diameter draining ulcer on the plantar aspect of the great toe with cellulitis of the great toe and sausage digit swelling up to the MTP joint.  Most recent hemoglobin A1c is 7.2.  Imaging: No results found. No images are attached to the encounter.  Labs: Lab Results  Component Value Date   HGBA1C 7.2 (H) 06/03/2020      Lab Results  Component Value Date   ALBUMIN 3.6 06/03/2020   ALBUMIN 3.2 (L) 04/20/2019    No results found for: MG No results found for: VD25OH  No results found for: PREALBUMIN CBC EXTENDED Latest Ref Rng & Units 06/03/2020 03/31/2020 04/20/2019  WBC 4.0 - 10.5 K/uL 8.4 - 6.3  RBC 3.87 - 5.11 MIL/uL 4.51 - 4.62  HGB 12.0 - 15.0 g/dL 12.5 14.3 13.6  HCT 36.0 - 46.0 % 40.3 42.0 41.8  PLT 150 - 400 K/uL 343 - 226  NEUTROABS 1.7 - 7.7 K/uL 5.8 - 5.3  LYMPHSABS 0.7 - 4.0 K/uL 1.6 - 0.5(L)     There is no height or weight on file to calculate BMI.  Orders:  No orders of the defined types were placed in this encounter.  No orders of the defined types were placed in this encounter.    Procedures: No procedures performed  Clinical Data: No additional findings.  ROS:  All other systems negative, except as noted in the HPI. Review of Systems  Objective: Vital Signs: There were no vitals taken for this visit.  Specialty Comments:  No specialty comments available.  PMFS History: Patient Active Problem List   Diagnosis Date Noted  . Diabetic ulcer of left great toe ([place])   . DM (diabetes mellitus), type 2 with  peripheral vascular complications ([place]) 03/70/4888  . HLD (hyperlipidemia) 06/03/2020  . HTN (hypertension) 06/03/2020  . Osteomyelitis of great toe of left foot ([place]) 06/03/2020  . Cellulitis of left foot 06/03/2020  . Atherosclerosis of native artery of left lower extremity with ulceration ([place]) 03/30/2020  . DJD (degenerative joint disease) 04/21/2014   Past Medical History:  Diagnosis Date  . Arthritis    OSTEO  . Diabetes mellitus without complication ([place])    TYPE 2  . Family history of adverse reaction to anesthesia    Mother - N/V  . Goiter   . Hyperlipidemia   . Hypertension   . PONV (postoperative nausea and vomiting)   . Retinopathy     Family History  Problem Relation Age of Onset  . Healthy Mother   . Healthy Father     Past  Surgical History:  Procedure Laterality Date  . ABDOMINAL AORTOGRAM W/LOWER EXTREMITY N/A 03/31/2020   Procedure: ABDOMINAL AORTOGRAM W/LOWER EXTREMITY;  Surgeon: Cherre Robins, MD;  Location: [facility];  Service: Cardiovascular;  Laterality: N/A;  . ABDOMINAL HYSTERECTOMY    . AIR/FLUID EXCHANGE Left 11/30/2014   Procedure: AIR/FLUID EXCHANGE LEFT EYE;  Surgeon: Hurman Horn, MD;  Location: [facility];  Service: Ophthalmology;  Laterality: Left;  . BACK SURGERY    . CESAREAN SECTION     x 2  . EYE SURGERY    . INJECTION OF SILICONE OIL Right 12/17/9448   Procedure: INJECTION OF SILICONE OIL;  Surgeon: Hurman Horn, MD;  Location: [facility];  Service: Ophthalmology;  Laterality: Right;  . INJECTION OF SILICONE OIL Left 3/88/8280   Procedure: INJECTION OF SILICONE OIL;  Surgeon: Hurman Horn, MD;  Location: [facility];  Service: Ophthalmology;  Laterality: Left;  . LASER PHOTO ABLATION Right 05/26/2014   Procedure: LASER PHOTO ABLATION;  Surgeon: Hurman Horn, MD;  Location: [facility];  Service: Ophthalmology;  Laterality: Right;  . PARS PLANA VITRECTOMY Right 05/26/2014   Procedure: PARS PLANA VITRECTOMY 25 GAUGE;  Surgeon: Hurman Horn, MD;  Location: [facility];  Service: Ophthalmology;  Laterality: Right;  . PARS PLANA VITRECTOMY Left 11/30/2014   Procedure: PARS PLANA VITRECTOMY WITH 25 GAUGE with endolaser;  Surgeon: Hurman Horn, MD;  Location: [facility];  Service: Ophthalmology;  Laterality: Left;  . PERIPHERAL VASCULAR INTERVENTION Left 03/31/2020   Procedure: PERIPHERAL VASCULAR INTERVENTION;  Surgeon: Cherre Robins, MD;  Location: [facility];  Service: Cardiovascular;  Laterality: Left;  superficial femoral  . TONSILLECTOMY    . TOTAL HIP ARTHROPLASTY Right 04/21/2014   dr Percell Miller  . TOTAL HIP ARTHROPLASTY Right 04/21/2014   Procedure: RIGHT TOTAL HIP ARTHROPLASTY ANTERIOR APPROACH;  Surgeon: Renette Butters, MD;  Location: [facility];  Service: Orthopedics;  Laterality: Right;   Social  History   Occupational History  . Not on file  Tobacco Use  . Smoking status: Never Smoker  . Smokeless tobacco: Never Used  Vaping Use  . Vaping Use: Never used  Substance and Sexual Activity  . Alcohol use: No  . Drug use: No  . Sexual activity: Not on file

## 2020-06-09 ENCOUNTER — Other Ambulatory Visit: Payer: Self-pay

## 2020-06-10 ENCOUNTER — Other Ambulatory Visit (HOSPITAL_COMMUNITY)
Admission: RE | Admit: 2020-06-10 | Discharge: 2020-06-10 | Disposition: A | Discharge status: Home / Self Care | Payer: Medicare Other | Source: Ambulatory Visit | Attending: Orthopedic Surgery | Admitting: Orthopedic Surgery

## 2020-06-10 ENCOUNTER — Other Ambulatory Visit: Payer: Self-pay | Admitting: Physician Assistant

## 2020-06-10 ENCOUNTER — Telehealth: Payer: Self-pay | Admitting: Orthopedic Surgery

## 2020-06-10 ENCOUNTER — Encounter (HOSPITAL_COMMUNITY): Payer: Self-pay | Admitting: Orthopedic Surgery

## 2020-06-10 DIAGNOSIS — Z01812 Encounter for preprocedural laboratory examination: Secondary | ICD-10-CM | POA: Insufficient documentation

## 2020-06-10 DIAGNOSIS — Z20822 Contact with and (suspected) exposure to covid-19: Secondary | ICD-10-CM | POA: Diagnosis not present

## 2020-06-10 LAB — SARS CORONAVIRUS 2 (TAT 6-24 HRS): SARS Coronavirus 2: NEGATIVE

## 2020-06-10 NOTE — Progress Notes (Signed)
Spoke with pt for pre-op call. Pt denies cardiac history. Pt is treated for HTN and Diabetes. Pt's last A1C was 7.2 on 06/03/20. She states her fasting blood sugar has been running around 150. Instructed pt to hold her Glipizide and Janumet tonight and in the AM. Instructed pt to check her blood sugar in the AM when she gets up and every 2 hours until she leaves for the hospital. If blood sugar is 70 or below, treat with 1/2 cup of clear juice (apple or cranberry) and recheck blood sugar 15 minutes after drinking juice. If blood sugar continues to be 70 or below, call the Short Stay department and ask to speak to a nurse. Pt voiced understanding.  Pt wants the the staff know that she cannot lie flat for more than 10 minutes at a time due to silicone oil in her eye.   Covid test done today, result pending. Pt states she's been in quarantine since the test was done and understands that she stays in quarantine until he comes to the hospital tomorrow.

## 2020-06-10 NOTE — Telephone Encounter (Signed)
I called and advised the pt that per Dr. Sharol Given to be NPO after midnight and to hold all her morning medications except for her glipizide. Pt voiced understanding and is sch for surgery tomorrow.

## 2020-06-10 NOTE — Telephone Encounter (Signed)
Pt called stating she is having surgery in the morning with Dr. Erlinda Hong and no one called and gave her pre op instructions. Pt is wanting to know how late she can eat as well as she has questions on how she's supposed to go about taking her medications that are supposed to taken in the morning.   [phone]

## 2020-06-11 ENCOUNTER — Ambulatory Visit (HOSPITAL_COMMUNITY): Payer: Medicare Other | Admitting: Anesthesiology

## 2020-06-11 ENCOUNTER — Encounter (HOSPITAL_COMMUNITY)
Admission: RE | Disposition: A | Discharge status: Home / Self Care | Payer: Self-pay | Source: Home / Self Care | Attending: Orthopedic Surgery

## 2020-06-11 ENCOUNTER — Ambulatory Visit (HOSPITAL_COMMUNITY)
Admission: RE | Admit: 2020-06-11 | Discharge: 2020-06-11 | Disposition: A | Discharge status: Home / Self Care | Payer: Medicare Other | Attending: Orthopedic Surgery | Admitting: Orthopedic Surgery

## 2020-06-11 ENCOUNTER — Encounter (HOSPITAL_COMMUNITY): Payer: Self-pay | Admitting: Orthopedic Surgery

## 2020-06-11 ENCOUNTER — Other Ambulatory Visit: Payer: Self-pay

## 2020-06-11 DIAGNOSIS — Z96641 Presence of right artificial hip joint: Secondary | ICD-10-CM | POA: Diagnosis not present

## 2020-06-11 DIAGNOSIS — E11621 Type 2 diabetes mellitus with foot ulcer: Secondary | ICD-10-CM | POA: Diagnosis not present

## 2020-06-11 DIAGNOSIS — Z87891 Personal history of nicotine dependence: Secondary | ICD-10-CM | POA: Insufficient documentation

## 2020-06-11 DIAGNOSIS — I10 Essential (primary) hypertension: Secondary | ICD-10-CM | POA: Diagnosis not present

## 2020-06-11 DIAGNOSIS — E1151 Type 2 diabetes mellitus with diabetic peripheral angiopathy without gangrene: Secondary | ICD-10-CM | POA: Diagnosis not present

## 2020-06-11 DIAGNOSIS — L97529 Non-pressure chronic ulcer of other part of left foot with unspecified severity: Secondary | ICD-10-CM

## 2020-06-11 DIAGNOSIS — E1169 Type 2 diabetes mellitus with other specified complication: Secondary | ICD-10-CM | POA: Diagnosis not present

## 2020-06-11 DIAGNOSIS — M869 Osteomyelitis, unspecified: Secondary | ICD-10-CM | POA: Insufficient documentation

## 2020-06-11 DIAGNOSIS — E1142 Type 2 diabetes mellitus with diabetic polyneuropathy: Secondary | ICD-10-CM | POA: Insufficient documentation

## 2020-06-11 HISTORY — PX: AMPUTATION: SHX166

## 2020-06-11 LAB — GLUCOSE, CAPILLARY
Glucose-Capillary: 185 mg/dL — ABNORMAL HIGH (ref 70–99)
Glucose-Capillary: 170 mg/dL — ABNORMAL HIGH (ref 70–99)
Glucose-Capillary: 181 mg/dL — ABNORMAL HIGH (ref 70–99)

## 2020-06-11 SURGERY — AMPUTATION DIGIT
Anesthesia: Monitor Anesthesia Care | Laterality: Left

## 2020-06-11 MED ORDER — LACTATED RINGERS IV SOLN: INTRAVENOUS | Status: DC | PRN

## 2020-06-11 MED ORDER — PROPOFOL 500 MG/50ML IV EMUL
INTRAVENOUS | Status: DC | PRN
  Administered 2020-06-11: 75 ug/kg/min via INTRAVENOUS

## 2020-06-11 MED ORDER — MIDAZOLAM HCL 2 MG/2ML IJ SOLN
INTRAMUSCULAR | Status: AC
  Filled 2020-06-11: qty 2

## 2020-06-11 MED ORDER — LIDOCAINE 2% (20 MG/ML) 5 ML SYRINGE
INTRAMUSCULAR | Status: AC
  Filled 2020-06-11: qty 5

## 2020-06-11 MED ORDER — ONDANSETRON HCL 4 MG/2ML IJ SOLN
INTRAMUSCULAR | Status: DC | PRN
  Administered 2020-06-11: 4 mg via INTRAVENOUS

## 2020-06-11 MED ORDER — CHLORHEXIDINE GLUCONATE 0.12 % MT SOLN
OROMUCOSAL | Status: AC
  Administered 2020-06-11: 15 mL via OROMUCOSAL
  Filled 2020-06-11: qty 15

## 2020-06-11 MED ORDER — HYDROCODONE-ACETAMINOPHEN 5-325 MG PO TABS: 1.0000 | ORAL_TABLET | ORAL | 0 refills | Status: DC | PRN

## 2020-06-11 MED ORDER — FENTANYL CITRATE (PF) 250 MCG/5ML IJ SOLN
INTRAMUSCULAR | Status: AC
  Filled 2020-06-11: qty 5

## 2020-06-11 MED ORDER — CHLORHEXIDINE GLUCONATE 0.12 % MT SOLN: 15.0000 mL | Freq: Once | OROMUCOSAL | Status: AC

## 2020-06-11 MED ORDER — LIDOCAINE HCL (PF) 1 % IJ SOLN
INTRAMUSCULAR | Status: AC
  Filled 2020-06-11: qty 30

## 2020-06-11 MED ORDER — CEFAZOLIN SODIUM-DEXTROSE 2-4 GM/100ML-% IV SOLN
2.0000 g | INTRAVENOUS | Status: AC
  Administered 2020-06-11: 2 g via INTRAVENOUS
  Filled 2020-06-11: qty 100

## 2020-06-11 MED ORDER — FENTANYL CITRATE (PF) 250 MCG/5ML IJ SOLN
INTRAMUSCULAR | Status: DC | PRN
  Administered 2020-06-11: 50 ug via INTRAVENOUS

## 2020-06-11 MED ORDER — LIDOCAINE HCL 1 % IJ SOLN
INTRAMUSCULAR | Status: DC | PRN
  Administered 2020-06-11: 10 mL

## 2020-06-11 MED ORDER — 0.9 % SODIUM CHLORIDE (POUR BTL) OPTIME
TOPICAL | Status: DC | PRN
  Administered 2020-06-11: 1000 mL

## 2020-06-11 MED ORDER — MIDAZOLAM HCL 2 MG/2ML IJ SOLN
INTRAMUSCULAR | Status: DC | PRN
  Administered 2020-06-11: 2 mg via INTRAVENOUS

## 2020-06-11 MED ORDER — ORAL CARE MOUTH RINSE: 15.0000 mL | Freq: Once | OROMUCOSAL | Status: AC

## 2020-06-11 MED ORDER — ONDANSETRON HCL 4 MG/2ML IJ SOLN
INTRAMUSCULAR | Status: AC
  Filled 2020-06-11: qty 2

## 2020-06-11 MED ORDER — LACTATED RINGERS IV SOLN: INTRAVENOUS | Status: DC

## 2020-06-11 SURGICAL SUPPLY — 31 items
BLADE SURG 21 STRL SS (BLADE) ×2 IMPLANT
BNDG COHESIVE 4X5 TAN STRL (GAUZE/BANDAGES/DRESSINGS) ×2 IMPLANT
BNDG COHESIVE 6X5 TAN NS LF (GAUZE/BANDAGES/DRESSINGS) ×2 IMPLANT
BNDG ESMARK 4X9 LF (GAUZE/BANDAGES/DRESSINGS) IMPLANT
BNDG GAUZE ELAST 4 BULKY (GAUZE/BANDAGES/DRESSINGS) ×2 IMPLANT
COVER SURGICAL LIGHT HANDLE (MISCELLANEOUS) ×4 IMPLANT
COVER WAND RF STERILE (DRAPES) ×2 IMPLANT
DRAPE U-SHAPE 47X51 STRL (DRAPES) ×2 IMPLANT
DRSG ADAPTIC 3X8 NADH LF (GAUZE/BANDAGES/DRESSINGS) ×2 IMPLANT
DRSG PAD ABDOMINAL 8X10 ST (GAUZE/BANDAGES/DRESSINGS) ×2 IMPLANT
DURAPREP 26ML APPLICATOR (WOUND CARE) ×2 IMPLANT
ELECT REM PT RETURN 9FT ADLT (ELECTROSURGICAL) ×2
ELECTRODE REM PT RTRN 9FT ADLT (ELECTROSURGICAL) ×1 IMPLANT
GAUZE SPONGE 4X4 12PLY STRL (GAUZE/BANDAGES/DRESSINGS) IMPLANT
GAUZE SPONGE 4X4 16PLY XRAY LF (GAUZE/BANDAGES/DRESSINGS) ×2 IMPLANT
GLOVE BIOGEL PI IND STRL 9 (GLOVE) ×1 IMPLANT
GLOVE BIOGEL PI INDICATOR 9 (GLOVE) ×1
GLOVE SURG ORTHO 9.0 STRL STRW (GLOVE) ×2 IMPLANT
GOWN STRL REUS W/ TWL XL LVL3 (GOWN DISPOSABLE) ×2 IMPLANT
GOWN STRL REUS W/TWL XL LVL3 (GOWN DISPOSABLE) ×4
KIT BASIN OR (CUSTOM PROCEDURE TRAY) ×2 IMPLANT
KIT TURNOVER KIT B (KITS) ×2 IMPLANT
MANIFOLD NEPTUNE II (INSTRUMENTS) ×2 IMPLANT
NEEDLE 22X1 1/2 (OR ONLY) (NEEDLE) IMPLANT
NS IRRIG 1000ML POUR BTL (IV SOLUTION) ×2 IMPLANT
PACK ORTHO EXTREMITY (CUSTOM PROCEDURE TRAY) ×2 IMPLANT
PAD ABD 8X10 STRL (GAUZE/BANDAGES/DRESSINGS) ×2 IMPLANT
PAD ARMBOARD 7.5X6 YLW CONV (MISCELLANEOUS) ×4 IMPLANT
SUT ETHILON 2 0 PSLX (SUTURE) ×2 IMPLANT
SYR CONTROL 10ML LL (SYRINGE) IMPLANT
TOWEL GREEN STERILE (TOWEL DISPOSABLE) ×2 IMPLANT

## 2020-06-11 NOTE — Interval H&P Note (Signed)
History and Physical Interval Note:  06/11/2020 6:52 AM  [name]  has presented today for surgery, with the diagnosis of Osteomyelitis Left Great Toe.  The various methods of treatment have been discussed with the patient and family. After consideration of risks, benefits and other options for treatment, the patient has consented to  Procedure(s): LEFT GREAT TOE AMPUTATION (Left) as a surgical intervention.  The patient's history has been reviewed, patient examined, no change in status, stable for surgery.  I have reviewed the patient's chart and labs.  Questions were answered to the patient's satisfaction.     Newt Minion

## 2020-06-11 NOTE — Progress Notes (Signed)
Orthopedic Tech Progress Note Patient Details:  [name] 18-May-1955 [id number]  Ortho Devices Type of Ortho Device: Postop shoe/boot Ortho Device/Splint Location: LLE Ortho Device/Splint Interventions: Ordered,Application,Adjustment   Post Interventions Patient Tolerated: Well Instructions Provided: Care of device,Adjustment of device,Poper ambulation with device   Kamarion Zagami 06/11/2020, 12:07 PM

## 2020-06-11 NOTE — Transfer of Care (Signed)
Immediate Anesthesia Transfer of Care Note  Patient: HILLIARY JOCK  Procedure(s) Performed: LEFT GREAT TOE AMPUTATION (Left )  Patient Location: PACU  Anesthesia Type:MAC  Level of Consciousness: awake, alert  and oriented  Airway & Oxygen Therapy: Patient Spontanous Breathing  Post-op Assessment: Report given to RN, Post -op Vital signs reviewed and stable and Patient moving all extremities X 4  Post vital signs: Reviewed and stable  Last Vitals:  Vitals Value Taken Time  BP 124/67 06/11/20 1009  Temp    Pulse 92 06/11/20 1009  Resp 19 06/11/20 1009  SpO2 98 % 06/11/20 1009  Vitals shown include unvalidated device data.  Last Pain:  Vitals:   06/11/20 0751  TempSrc:   PainSc: 0-No pain         Complications: No complications documented.

## 2020-06-11 NOTE — Anesthesia Procedure Notes (Signed)
Procedure Name: MAC Date/Time: 06/11/2020 9:47 AM Performed by: Harden Mo, CRNA Pre-anesthesia Checklist: Patient identified, Emergency Drugs available, Suction available and Patient being monitored Patient Re-evaluated:Patient Re-evaluated prior to induction Oxygen Delivery Method: Simple face mask Preoxygenation: Pre-oxygenation with 100% oxygen Induction Type: IV induction Placement Confirmation: positive ETCO2 and breath sounds checked- equal and bilateral Dental Injury: Teeth and Oropharynx as per pre-operative assessment

## 2020-06-11 NOTE — H&P (Signed)
[name] is an 65 y.o. female.   Chief Complaint: left great toe osteomyelitis HPI: Patient is a 65 year old woman who is seen for initial evaluation for osteomyelitis ulceration left great toe.  Patient is status post revascularization with Dr. Luan Pulling.  She has excellent revascularization she is currently on Plavix and doxycycline and has persistent ulcer with drainage and cellulitis.  Ankle-brachial indices on March 4 showed an ABI of 0.92.  MRI scan shows osteomyelitis of the tuft of the left great toe.  Past Medical History:  Diagnosis Date  . Arthritis    OSTEO  . Diabetes mellitus without complication ([place])    TYPE 2  . Family history of adverse reaction to anesthesia    Mother - N/V  . Goiter   . Hyperlipidemia   . Hypertension   . PONV (postoperative nausea and vomiting)   . Retinopathy     Past Surgical History:  Procedure Laterality Date  . ABDOMINAL AORTOGRAM W/LOWER EXTREMITY N/A 03/31/2020   Procedure: ABDOMINAL AORTOGRAM W/LOWER EXTREMITY;  Surgeon: Cherre Robins, MD;  Location: [facility];  Service: Cardiovascular;  Laterality: N/A;  . ABDOMINAL HYSTERECTOMY    . AIR/FLUID EXCHANGE Left 11/30/2014   Procedure: AIR/FLUID EXCHANGE LEFT EYE;  Surgeon: Hurman Horn, MD;  Location: [facility];  Service: Ophthalmology;  Laterality: Left;  . BACK SURGERY    . CESAREAN SECTION     x 2  . EYE SURGERY    . INJECTION OF SILICONE OIL Right 07/04/4740   Procedure: INJECTION OF SILICONE OIL;  Surgeon: Hurman Horn, MD;  Location: [facility];  Service: Ophthalmology;  Laterality: Right;  . INJECTION OF SILICONE OIL Left 5/95/6387   Procedure: INJECTION OF SILICONE OIL;  Surgeon: Hurman Horn, MD;  Location: [facility];  Service: Ophthalmology;  Laterality: Left;  . LASER PHOTO ABLATION Right 05/26/2014   Procedure: LASER PHOTO ABLATION;  Surgeon: Hurman Horn, MD;  Location: [facility];  Service: Ophthalmology;  Laterality: Right;  . PARS PLANA VITRECTOMY Right 05/26/2014    Procedure: PARS PLANA VITRECTOMY 25 GAUGE;  Surgeon: Hurman Horn, MD;  Location: [facility];  Service: Ophthalmology;  Laterality: Right;  . PARS PLANA VITRECTOMY Left 11/30/2014   Procedure: PARS PLANA VITRECTOMY WITH 25 GAUGE with endolaser;  Surgeon: Hurman Horn, MD;  Location: [facility];  Service: Ophthalmology;  Laterality: Left;  . PERIPHERAL VASCULAR INTERVENTION Left 03/31/2020   Procedure: PERIPHERAL VASCULAR INTERVENTION;  Surgeon: Cherre Robins, MD;  Location: [facility];  Service: Cardiovascular;  Laterality: Left;  superficial femoral  . TONSILLECTOMY    . TOTAL HIP ARTHROPLASTY Right 04/21/2014   dr Percell Miller  . TOTAL HIP ARTHROPLASTY Right 04/21/2014   Procedure: RIGHT TOTAL HIP ARTHROPLASTY ANTERIOR APPROACH;  Surgeon: Renette Butters, MD;  Location: [facility];  Service: Orthopedics;  Laterality: Right;    Family History  Problem Relation Age of Onset  . Healthy Mother   . Healthy Father    Social History:  reports that she has quit smoking. She has never used smokeless tobacco. She reports that she does not drink alcohol and does not use drugs.  Allergies:  Allergies  Allergen Reactions  . Anesthetics, Amide Nausea And Vomiting  . Codeine Nausea And Vomiting    No medications prior to admission.    Results for orders placed or performed during the hospital encounter of 06/10/20 (from the past 48 hour(s))  SARS CORONAVIRUS 2 (TAT 6-24 HRS) Nasopharyngeal Nasopharyngeal Swab  Status: None   Collection Time: 06/10/20 10:10 AM   Specimen: Nasopharyngeal Swab  Result Value Ref Range   SARS Coronavirus 2 NEGATIVE NEGATIVE    Comment: (NOTE) SARS-CoV-2 target nucleic acids are NOT DETECTED.  The SARS-CoV-2 RNA is generally detectable in upper and lower respiratory specimens during the acute phase of infection. Negative results do not preclude SARS-CoV-2 infection, do not rule out co-infections with other pathogens, and should not be used as the sole basis for  treatment or other patient management decisions. Negative results must be combined with clinical observations, patient history, and epidemiological information. The expected result is Negative.  Fact Sheet for Patients: SugarRoll.be  Fact Sheet for Healthcare Providers: https://www.woods-mathews.com/  This test is not yet approved or cleared by the [place] FDA and  has been authorized for detection and/or diagnosis of SARS-CoV-2 by FDA under an Emergency Use Authorization (EUA). This EUA will remain  in effect (meaning this test can be used) for the duration of the COVID-19 declaration under Se ction 564(b)(1) of the Act, 21 U.S.C. section 360bbb-3(b)(1), unless the authorization is terminated or revoked sooner.  Performed at [facility], [place] [address], [place], [place] [address]    No results found.  Review of Systems  All other systems reviewed and are negative.   There were no vitals taken for this visit. Physical Exam  Patient is alert, oriented, no adenopathy, well-dressed, normal affect, normal respiratory effort. Examination patient has an excellent palpable dorsalis pedis pulse.  There is no ascending cellulitis or ulcers in the hindfoot and midfoot.  She has a large 3 cm in diameter draining ulcer on the plantar aspect of the great toe with cellulitis of the great toe and sausage digit swelling up to the MTP joint.  Most recent hemoglobin A1c is 7.2.heart RRR Lungs clear Assessment/Plan 1. Osteomyelitis of great toe of left foot ([place])   2. PVD (peripheral vascular disease) ([place])   3. Diabetic polyneuropathy associated with type 2 diabetes mellitus ([place])     Plan: Will plan for left great toe amputation at the MTP joint risk and benefits were discussed including risk of the wound not healing need for additional surgery.  Patient states she understands wished to proceed with outpatient surgery on Friday.   Bevely Palmer Persons, PA 06/11/2020, 6:35 AM

## 2020-06-11 NOTE — Op Note (Signed)
06/11/2020  10:05 AM  PATIENT:  [name]    PRE-OPERATIVE DIAGNOSIS:  Osteomyelitis Left Great Toe  POST-OPERATIVE DIAGNOSIS:  Same  PROCEDURE:  LEFT GREAT TOE AMPUTATION  SURGEON:  Newt Minion, MD  PHYSICIAN ASSISTANT:None ANESTHESIA:   General  PREOPERATIVE INDICATIONS:  [name] is a  65 y.o. female with a diagnosis of Osteomyelitis Left Great Toe who failed conservative measures and elected for surgical management.    The risks benefits and alternatives were discussed with the patient preoperatively including but not limited to the risks of infection, bleeding, nerve injury, cardiopulmonary complications, the need for revision surgery, among others, and the patient was willing to proceed.  OPERATIVE IMPLANTS: none  _0 @  OPERATIVE FINDINGS: Good petechial bleeding at the MTP joint with no abscess  OPERATIVE PROCEDURE: Patient was brought the operating room and underwent a MAC anesthetic.  After adequate levels anesthesia were obtained patient's left lower extremity was prepped using DuraPrep draped into a sterile field a timeout was called.  Patient underwent a digital block with 10 cc of 1% lidocaine plain.  A fishmouth incision was made just distal to the MTP joint the great toe was amputated through the MTP joint there was good petechial bleeding electrocautery was used for hemostasis the wound was irrigated normal saline incision was closed using 2-0 nylon a sterile dressing was applied patient was taken the PACU in stable condition   DISCHARGE PLANNING:  Antibiotic duration: Preoperative antibiotics  Weightbearing: Touchdown weightbearing on the left  Pain medication: Prescription for Vicodin  Dressing care/ Wound VAC: Follow-up in 1 week to change the dressing  Ambulatory devices: Patient has a walker  Discharge to: Home.  Follow-up: In the office 1 week post operative.

## 2020-06-11 NOTE — Anesthesia Postprocedure Evaluation (Signed)
Anesthesia Post Note  Patient: [name]  Procedure(s) Performed: LEFT GREAT TOE AMPUTATION (Left )     Patient location during evaluation: PACU Anesthesia Type: MAC Level of consciousness: awake and alert Pain management: pain level controlled Vital Signs Assessment: post-procedure vital signs reviewed and stable Respiratory status: spontaneous breathing, nonlabored ventilation, respiratory function stable and patient connected to nasal cannula oxygen Cardiovascular status: stable and blood pressure returned to baseline Postop Assessment: no apparent nausea or vomiting Anesthetic complications: no   No complications documented.  Last Vitals:  Vitals:   06/11/20 0701 06/11/20 1010  BP: (!) 170/70 124/67  Pulse: 95 92  Resp: 18 19  Temp: 36.7 C (!) 36.1 C  SpO2: 100% 98%    Last Pain:  Vitals:   06/11/20 1010  TempSrc:   PainSc: 0-No pain                 Signe Tackitt S

## 2020-06-11 NOTE — Anesthesia Preprocedure Evaluation (Signed)
Anesthesia Evaluation  Patient identified by MRN, date of birth, ID band Patient awake    Reviewed: Allergy & Precautions, NPO status , Patient's Chart, lab work & pertinent test results  Airway Mallampati: II  TM Distance: >3 FB Neck ROM: Full    Dental no notable dental hx.    Pulmonary neg pulmonary ROS, former smoker,    Pulmonary exam normal breath sounds clear to auscultation       Cardiovascular hypertension, + Peripheral Vascular Disease  Normal cardiovascular exam Rhythm:Regular Rate:Normal     Neuro/Psych negative neurological ROS  negative psych ROS   GI/Hepatic negative GI ROS, Neg liver ROS,   Endo/Other  diabetes, Type 2  Renal/GU negative Renal ROS  negative genitourinary   Musculoskeletal negative musculoskeletal ROS (+)   Abdominal   Peds negative pediatric ROS (+)  Hematology negative hematology ROS (+)   Anesthesia Other Findings   Reproductive/Obstetrics negative OB ROS                             Anesthesia Physical Anesthesia Plan  ASA: III  Anesthesia Plan: MAC   Post-op Pain Management:    Induction: Intravenous  PONV Risk Score and Plan: 2 and Ondansetron  Airway Management Planned: Simple Face Mask  Additional Equipment:   Intra-op Plan:   Post-operative Plan: Extubation in OR  Informed Consent: I have reviewed the patients History and Physical, chart, labs and discussed the procedure including the risks, benefits and alternatives for the proposed anesthesia with the patient or authorized representative who has indicated his/her understanding and acceptance.     Dental advisory given  Plan Discussed with: CRNA and Surgeon  Anesthesia Plan Comments:         Anesthesia Quick Evaluation

## 2020-06-12 ENCOUNTER — Encounter (HOSPITAL_COMMUNITY): Payer: Self-pay | Admitting: Orthopedic Surgery

## 2020-06-18 ENCOUNTER — Other Ambulatory Visit: Payer: Self-pay

## 2020-06-18 ENCOUNTER — Ambulatory Visit (INDEPENDENT_AMBULATORY_CARE_PROVIDER_SITE_OTHER): Payer: Medicare Other | Admitting: Physician Assistant

## 2020-06-18 ENCOUNTER — Encounter: Payer: Self-pay | Admitting: Physician Assistant

## 2020-06-18 DIAGNOSIS — M869 Osteomyelitis, unspecified: Secondary | ICD-10-CM

## 2020-06-18 NOTE — Progress Notes (Signed)
Office Visit Note   Patient: [name]           Date of Birth: 16-May-1955           MRN: [id number] Visit Date: 06/18/2020              Requested by: Celene Squibb, MD [place],  [place] [id number] PCP: Celene Squibb, MD  No chief complaint on file.     HPI: Patient is 1 week status post left great toe amputation.  She is overall doing well she has little sensation in her foot so she is not complaining of any pain  Assessment & Plan: Visit Diagnoses: No diagnosis found.  Plan: New dressing was placed today.  She may cleanse daily with Dial soap and water.  Emphasized the importance of elevating above heart level as she can.  Refrain from weightbearing through her forefoot.  We will follow-up in 1 week.  Follow-Up Instructions: No follow-ups on file.   Ortho Exam  Patient is alert, oriented, no adenopathy, well-dressed, normal affect, normal respiratory effort. Left great toe well approximated wound edges with some bloody drainage.  No necrosis no ascending cellulitis there is some slight erythema around the wound which improves with elevation.  Mild to moderate soft tissue swelling no evidence of infection  Imaging: No results found. No images are attached to the encounter.  Labs: Lab Results  Component Value Date   HGBA1C 7.2 (H) 06/03/2020     Lab Results  Component Value Date   ALBUMIN 3.6 06/03/2020   ALBUMIN 3.2 (L) 04/20/2019    No results found for: MG No results found for: VD25OH  No results found for: PREALBUMIN CBC EXTENDED Latest Ref Rng & Units 06/03/2020 03/31/2020 04/20/2019  WBC 4.0 - 10.5 K/uL 8.4 - 6.3  RBC 3.87 - 5.11 MIL/uL 4.51 - 4.62  HGB 12.0 - 15.0 g/dL 12.5 14.3 13.6  HCT 36.0 - 46.0 % 40.3 42.0 41.8  PLT 150 - 400 K/uL 343 - 226  NEUTROABS 1.7 - 7.7 K/uL 5.8 - 5.3  LYMPHSABS 0.7 - 4.0 K/uL 1.6 - 0.5(L)     There is no height or weight on file to calculate BMI.  Orders:  No orders of the defined types were  placed in this encounter.  No orders of the defined types were placed in this encounter.    Procedures: No procedures performed  Clinical Data: No additional findings.  ROS:  All other systems negative, except as noted in the HPI. Review of Systems  Objective: Vital Signs: There were no vitals taken for this visit.  Specialty Comments:  No specialty comments available.  PMFS History: Patient Active Problem List   Diagnosis Date Noted  . Diabetic ulcer of left great toe ([place])   . DM (diabetes mellitus), type 2 with peripheral vascular complications ([place]) 88/41/6606  . HLD (hyperlipidemia) 06/03/2020  . HTN (hypertension) 06/03/2020  . Osteomyelitis of great toe of left foot ([place]) 06/03/2020  . Cellulitis of left foot 06/03/2020  . Atherosclerosis of native artery of left lower extremity with ulceration ([place]) 03/30/2020  . DJD (degenerative joint disease) 04/21/2014   Past Medical History:  Diagnosis Date  . Arthritis    OSTEO  . Diabetes mellitus without complication ([place])    TYPE 2  . Family history of adverse reaction to anesthesia    Mother - N/V  . Goiter   . Hyperlipidemia   . Hypertension   .  PONV (postoperative nausea and vomiting)   . Retinopathy     Family History  Problem Relation Age of Onset  . Healthy Mother   . Healthy Father     Past Surgical History:  Procedure Laterality Date  . ABDOMINAL AORTOGRAM W/LOWER EXTREMITY N/A 03/31/2020   Procedure: ABDOMINAL AORTOGRAM W/LOWER EXTREMITY;  Surgeon: Cherre Robins, MD;  Location: [facility];  Service: Cardiovascular;  Laterality: N/A;  . ABDOMINAL HYSTERECTOMY    . AIR/FLUID EXCHANGE Left 11/30/2014   Procedure: AIR/FLUID EXCHANGE LEFT EYE;  Surgeon: Hurman Horn, MD;  Location: [facility];  Service: Ophthalmology;  Laterality: Left;  . AMPUTATION Left 06/11/2020   Procedure: LEFT GREAT TOE AMPUTATION;  Surgeon: Newt Minion, MD;  Location: [facility];  Service: Orthopedics;  Laterality: Left;  .  BACK SURGERY    . CESAREAN SECTION     x 2  . EYE SURGERY    . INJECTION OF SILICONE OIL Right 7/37/1062   Procedure: INJECTION OF SILICONE OIL;  Surgeon: Hurman Horn, MD;  Location: [facility];  Service: Ophthalmology;  Laterality: Right;  . INJECTION OF SILICONE OIL Left 6/94/8546   Procedure: INJECTION OF SILICONE OIL;  Surgeon: Hurman Horn, MD;  Location: [facility];  Service: Ophthalmology;  Laterality: Left;  . LASER PHOTO ABLATION Right 05/26/2014   Procedure: LASER PHOTO ABLATION;  Surgeon: Hurman Horn, MD;  Location: [facility];  Service: Ophthalmology;  Laterality: Right;  . PARS PLANA VITRECTOMY Right 05/26/2014   Procedure: PARS PLANA VITRECTOMY 25 GAUGE;  Surgeon: Hurman Horn, MD;  Location: [facility];  Service: Ophthalmology;  Laterality: Right;  . PARS PLANA VITRECTOMY Left 11/30/2014   Procedure: PARS PLANA VITRECTOMY WITH 25 GAUGE with endolaser;  Surgeon: Hurman Horn, MD;  Location: [facility];  Service: Ophthalmology;  Laterality: Left;  . PERIPHERAL VASCULAR INTERVENTION Left 03/31/2020   Procedure: PERIPHERAL VASCULAR INTERVENTION;  Surgeon: Cherre Robins, MD;  Location: [facility];  Service: Cardiovascular;  Laterality: Left;  superficial femoral  . TONSILLECTOMY    . TOTAL HIP ARTHROPLASTY Right 04/21/2014   dr Percell Miller  . TOTAL HIP ARTHROPLASTY Right 04/21/2014   Procedure: RIGHT TOTAL HIP ARTHROPLASTY ANTERIOR APPROACH;  Surgeon: Renette Butters, MD;  Location: [facility];  Service: Orthopedics;  Laterality: Right;   Social History   Occupational History  . Not on file  Tobacco Use  . Smoking status: Former Research scientist (life sciences)  . Smokeless tobacco: Never Used  . Tobacco comment: smoked as a teenager  Vaping Use  . Vaping Use: Never used  Substance and Sexual Activity  . Alcohol use: No  . Drug use: No  . Sexual activity: Not on file

## 2020-06-25 ENCOUNTER — Telehealth: Payer: Self-pay

## 2020-06-25 ENCOUNTER — Ambulatory Visit (INDEPENDENT_AMBULATORY_CARE_PROVIDER_SITE_OTHER): Payer: Medicare Other | Admitting: Family

## 2020-06-25 ENCOUNTER — Encounter: Payer: Self-pay | Admitting: Family

## 2020-06-25 DIAGNOSIS — M869 Osteomyelitis, unspecified: Secondary | ICD-10-CM

## 2020-06-25 NOTE — Progress Notes (Signed)
Post-Op Visit Note   Patient: [name]           Date of Birth: December 26, 1955           MRN: [id number] Visit Date: 06/25/2020 PCP: Celene Squibb, MD  Chief Complaint:  Chief Complaint  Patient presents with  . Left Foot - Routine Post Op    06/11/20 left GT amputation     HPI:  HPI The patient is a 65 year old woman seen status post left great toe amputation.  Sutures are in place.  She states she has been ambulating in the home without shoewear.  She is not ready to return to work she must frequently walk at work Ortho Exam On examination of the great toe amputation incision this is well approximated with sutures healing well.  Sutures harvested today without incident there is no drainage no surrounding erythema no sign of infection  Visit Diagnoses: No diagnosis found.  Plan: Sutures harvested.  She will continue daily Dial soap cleansing.  Keep the incision clean dry covered with a Band-Aid she will follow up once more in 10 days anticipate releasing her at that time  Follow-Up Instructions: Return in about 10 weeks (around 09/03/2020).   Imaging: No results found.  Orders:  No orders of the defined types were placed in this encounter.  No orders of the defined types were placed in this encounter.    PMFS History: Patient Active Problem List   Diagnosis Date Noted  . Diabetic ulcer of left great toe ([place])   . DM (diabetes mellitus), type 2 with peripheral vascular complications ([place]) 27/25/3664  . HLD (hyperlipidemia) 06/03/2020  . HTN (hypertension) 06/03/2020  . Osteomyelitis of great toe of left foot ([place]) 06/03/2020  . Cellulitis of left foot 06/03/2020  . Atherosclerosis of native artery of left lower extremity with ulceration ([place]) 03/30/2020  . DJD (degenerative joint disease) 04/21/2014   Past Medical History:  Diagnosis Date  . Arthritis    OSTEO  . Diabetes mellitus without complication ([place])    TYPE 2  . Family history of adverse reaction to  anesthesia    Mother - N/V  . Goiter   . Hyperlipidemia   . Hypertension   . PONV (postoperative nausea and vomiting)   . Retinopathy     Family History  Problem Relation Age of Onset  . Healthy Mother   . Healthy Father     Past Surgical History:  Procedure Laterality Date  . ABDOMINAL AORTOGRAM W/LOWER EXTREMITY N/A 03/31/2020   Procedure: ABDOMINAL AORTOGRAM W/LOWER EXTREMITY;  Surgeon: Cherre Robins, MD;  Location: [facility];  Service: Cardiovascular;  Laterality: N/A;  . ABDOMINAL HYSTERECTOMY    . AIR/FLUID EXCHANGE Left 11/30/2014   Procedure: AIR/FLUID EXCHANGE LEFT EYE;  Surgeon: Hurman Horn, MD;  Location: [facility];  Service: Ophthalmology;  Laterality: Left;  . AMPUTATION Left 06/11/2020   Procedure: LEFT GREAT TOE AMPUTATION;  Surgeon: Newt Minion, MD;  Location: [facility];  Service: Orthopedics;  Laterality: Left;  . BACK SURGERY    . CESAREAN SECTION     x 2  . EYE SURGERY    . INJECTION OF SILICONE OIL Right 07/04/4740   Procedure: INJECTION OF SILICONE OIL;  Surgeon: Hurman Horn, MD;  Location: [facility];  Service: Ophthalmology;  Laterality: Right;  . INJECTION OF SILICONE OIL Left 5/95/6387   Procedure: INJECTION OF SILICONE OIL;  Surgeon: Hurman Horn, MD;  Location: [facility];  Service: Ophthalmology;  Laterality: Left;  . LASER PHOTO ABLATION Right 05/26/2014   Procedure: LASER PHOTO ABLATION;  Surgeon: Hurman Horn, MD;  Location: [facility];  Service: Ophthalmology;  Laterality: Right;  . PARS PLANA VITRECTOMY Right 05/26/2014   Procedure: PARS PLANA VITRECTOMY 25 GAUGE;  Surgeon: Hurman Horn, MD;  Location: [facility];  Service: Ophthalmology;  Laterality: Right;  . PARS PLANA VITRECTOMY Left 11/30/2014   Procedure: PARS PLANA VITRECTOMY WITH 25 GAUGE with endolaser;  Surgeon: Hurman Horn, MD;  Location: [facility];  Service: Ophthalmology;  Laterality: Left;  . PERIPHERAL VASCULAR INTERVENTION Left 03/31/2020   Procedure: PERIPHERAL VASCULAR INTERVENTION;  Surgeon:  Cherre Robins, MD;  Location: [facility];  Service: Cardiovascular;  Laterality: Left;  superficial femoral  . TONSILLECTOMY    . TOTAL HIP ARTHROPLASTY Right 04/21/2014   dr Percell Miller  . TOTAL HIP ARTHROPLASTY Right 04/21/2014   Procedure: RIGHT TOTAL HIP ARTHROPLASTY ANTERIOR APPROACH;  Surgeon: Renette Butters, MD;  Location: [facility];  Service: Orthopedics;  Laterality: Right;   Social History   Occupational History  . Not on file  Tobacco Use  . Smoking status: Former Research scientist (life sciences)  . Smokeless tobacco: Never Used  . Tobacco comment: smoked as a teenager  Vaping Use  . Vaping Use: Never used  Substance and Sexual Activity  . Alcohol use: No  . Drug use: No  . Sexual activity: Not on file

## 2020-06-25 NOTE — Telephone Encounter (Signed)
Patient called stating that incision had opened up and was bleeding, stitches were removed today.   Talked with Junie Panning and advised her of message above.  Per Junie Panning patient should clean incision and redress it and try to limit her walking. If patient feels as if it is getting worse, then she can come into the office on Monday, 06/28/2020 or call the office back.   Advised patient of Erin's message above and patient voiced that she understands.

## 2020-07-05 ENCOUNTER — Other Ambulatory Visit: Payer: Self-pay

## 2020-07-05 ENCOUNTER — Ambulatory Visit (INDEPENDENT_AMBULATORY_CARE_PROVIDER_SITE_OTHER): Payer: Medicare Other | Admitting: Physician Assistant

## 2020-07-05 ENCOUNTER — Encounter: Payer: Self-pay | Admitting: Orthopedic Surgery

## 2020-07-05 DIAGNOSIS — M869 Osteomyelitis, unspecified: Secondary | ICD-10-CM

## 2020-07-05 NOTE — Progress Notes (Signed)
Office Visit Note   Patient: [name]           Date of Birth: 1956-02-17           MRN: [id number] Visit Date: 07/05/2020              Requested by: Celene Squibb, MD [place],  [place] [id number] PCP: Celene Squibb, MD  Chief Complaint  Patient presents with  . Left Foot - Routine Post Op    06/11/20 left GT amputation       HPI: Patient is 3 weeks status post left great toe amputation.  She did have her stitches removed and then down to have a dehiscence of the wound.  She is doing better now she has been cleansing this daily.  Has not noticed any foul odor or any more drainage  Assessment & Plan: Visit Diagnoses: No diagnosis found.  Plan: Continue daily cleansing and offloading in this portion of her foot.  Follow-up for recheck in 2 weeks  Follow-Up Instructions: No follow-ups on file.   Ortho Exam  Patient is alert, oriented, no adenopathy, well-dressed, normal affect, normal respiratory effort. Examination demonstrates superficial wound dehiscence.  No drainage some skin maceration no foul odor no ascending cellulitis or signs of infection swelling is well controlled  Imaging: No results found. No images are attached to the encounter.  Labs: Lab Results  Component Value Date   HGBA1C 7.2 (H) 06/03/2020     Lab Results  Component Value Date   ALBUMIN 3.6 06/03/2020   ALBUMIN 3.2 (L) 04/20/2019    No results found for: MG No results found for: VD25OH  No results found for: PREALBUMIN CBC EXTENDED Latest Ref Rng & Units 06/03/2020 03/31/2020 04/20/2019  WBC 4.0 - 10.5 K/uL 8.4 - 6.3  RBC 3.87 - 5.11 MIL/uL 4.51 - 4.62  HGB 12.0 - 15.0 g/dL 12.5 14.3 13.6  HCT 36.0 - 46.0 % 40.3 42.0 41.8  PLT 150 - 400 K/uL 343 - 226  NEUTROABS 1.7 - 7.7 K/uL 5.8 - 5.3  LYMPHSABS 0.7 - 4.0 K/uL 1.6 - 0.5(L)     There is no height or weight on file to calculate BMI.  Orders:  No orders of the defined types were placed in this encounter.  No  orders of the defined types were placed in this encounter.    Procedures: No procedures performed  Clinical Data: No additional findings.  ROS:  All other systems negative, except as noted in the HPI. Review of Systems  Objective: Vital Signs: There were no vitals taken for this visit.  Specialty Comments:  No specialty comments available.  PMFS History: Patient Active Problem List   Diagnosis Date Noted  . Diabetic ulcer of left great toe ([place])   . DM (diabetes mellitus), type 2 with peripheral vascular complications ([place]) 29/51/8841  . HLD (hyperlipidemia) 06/03/2020  . HTN (hypertension) 06/03/2020  . Osteomyelitis of great toe of left foot ([place]) 06/03/2020  . Cellulitis of left foot 06/03/2020  . Atherosclerosis of native artery of left lower extremity with ulceration ([place]) 03/30/2020  . DJD (degenerative joint disease) 04/21/2014   Past Medical History:  Diagnosis Date  . Arthritis    OSTEO  . Diabetes mellitus without complication ([place])    TYPE 2  . Family history of adverse reaction to anesthesia    Mother - N/V  . Goiter   . Hyperlipidemia   . Hypertension   . PONV (  postoperative nausea and vomiting)   . Retinopathy     Family History  Problem Relation Age of Onset  . Healthy Mother   . Healthy Father     Past Surgical History:  Procedure Laterality Date  . ABDOMINAL AORTOGRAM W/LOWER EXTREMITY N/A 03/31/2020   Procedure: ABDOMINAL AORTOGRAM W/LOWER EXTREMITY;  Surgeon: Cherre Robins, MD;  Location: [facility];  Service: Cardiovascular;  Laterality: N/A;  . ABDOMINAL HYSTERECTOMY    . AIR/FLUID EXCHANGE Left 11/30/2014   Procedure: AIR/FLUID EXCHANGE LEFT EYE;  Surgeon: Hurman Horn, MD;  Location: [facility];  Service: Ophthalmology;  Laterality: Left;  . AMPUTATION Left 06/11/2020   Procedure: LEFT GREAT TOE AMPUTATION;  Surgeon: Newt Minion, MD;  Location: [facility];  Service: Orthopedics;  Laterality: Left;  . BACK SURGERY    . CESAREAN  SECTION     x 2  . EYE SURGERY    . INJECTION OF SILICONE OIL Right 12/02/5174   Procedure: INJECTION OF SILICONE OIL;  Surgeon: Hurman Horn, MD;  Location: [facility];  Service: Ophthalmology;  Laterality: Right;  . INJECTION OF SILICONE OIL Left 1/60/7371   Procedure: INJECTION OF SILICONE OIL;  Surgeon: Hurman Horn, MD;  Location: [facility];  Service: Ophthalmology;  Laterality: Left;  . LASER PHOTO ABLATION Right 05/26/2014   Procedure: LASER PHOTO ABLATION;  Surgeon: Hurman Horn, MD;  Location: [facility];  Service: Ophthalmology;  Laterality: Right;  . PARS PLANA VITRECTOMY Right 05/26/2014   Procedure: PARS PLANA VITRECTOMY 25 GAUGE;  Surgeon: Hurman Horn, MD;  Location: [facility];  Service: Ophthalmology;  Laterality: Right;  . PARS PLANA VITRECTOMY Left 11/30/2014   Procedure: PARS PLANA VITRECTOMY WITH 25 GAUGE with endolaser;  Surgeon: Hurman Horn, MD;  Location: [facility];  Service: Ophthalmology;  Laterality: Left;  . PERIPHERAL VASCULAR INTERVENTION Left 03/31/2020   Procedure: PERIPHERAL VASCULAR INTERVENTION;  Surgeon: Cherre Robins, MD;  Location: [facility];  Service: Cardiovascular;  Laterality: Left;  superficial femoral  . TONSILLECTOMY    . TOTAL HIP ARTHROPLASTY Right 04/21/2014   dr Percell Miller  . TOTAL HIP ARTHROPLASTY Right 04/21/2014   Procedure: RIGHT TOTAL HIP ARTHROPLASTY ANTERIOR APPROACH;  Surgeon: Renette Butters, MD;  Location: [facility];  Service: Orthopedics;  Laterality: Right;   Social History   Occupational History  . Not on file  Tobacco Use  . Smoking status: Former Research scientist (life sciences)  . Smokeless tobacco: Never Used  . Tobacco comment: smoked as a teenager  Vaping Use  . Vaping Use: Never used  Substance and Sexual Activity  . Alcohol use: No  . Drug use: No  . Sexual activity: Not on file

## 2020-07-19 ENCOUNTER — Ambulatory Visit (INDEPENDENT_AMBULATORY_CARE_PROVIDER_SITE_OTHER): Payer: Medicare Other | Admitting: Orthopedic Surgery

## 2020-07-19 ENCOUNTER — Encounter: Payer: Self-pay | Admitting: Orthopedic Surgery

## 2020-07-19 DIAGNOSIS — I739 Peripheral vascular disease, unspecified: Secondary | ICD-10-CM

## 2020-07-19 DIAGNOSIS — M869 Osteomyelitis, unspecified: Secondary | ICD-10-CM

## 2020-07-19 DIAGNOSIS — E1142 Type 2 diabetes mellitus with diabetic polyneuropathy: Secondary | ICD-10-CM

## 2020-07-19 NOTE — Progress Notes (Signed)
Office Visit Note   Patient: DILLIE BURANDT           Date of Birth: 09-Nov-1955           MRN: [id number] Visit Date: 07/19/2020              Requested by: Celene Squibb, MD [place],  [place] [id number] PCP: Celene Squibb, MD  Chief Complaint  Patient presents with  . Left Foot - Routine Post Op    06/11/20 left GT amputation       HPI: Patient is a 65 year old woman who presents 5 weeks status post left great toe amputation she is currently full weightbearing in crocs.  Patient states she had to go back to work.  Assessment & Plan: Visit Diagnoses:  1. PVD (peripheral vascular disease) ([place])   2. Osteomyelitis of great toe of left foot ([place])   3. Diabetic polyneuropathy associated with type 2 diabetes mellitus ([place])     Plan: Patient was given a pair of short compression socks to be worn daily, patient did not want to use a postoperative shoe.  Follow-Up Instructions: Return in about 3 weeks (around 08/09/2020).   Ortho Exam  Patient is alert, oriented, no adenopathy, well-dressed, normal affect, normal respiratory effort. Examination patient has developed a blister over the lateral aspect the left little toe secondary to rubbing in her shoe the surgical incision has good petechial bleeding around the wound edges the mid aspect of the incision has fibrinous exudative tissue the wound is 10 cm in length 3 mm in width 1 mm deep.  There is no surrounding cellulitis no signs of infection.  Imaging: No results found. No images are attached to the encounter.  Labs: Lab Results  Component Value Date   HGBA1C 7.2 (H) 06/03/2020     Lab Results  Component Value Date   ALBUMIN 3.6 06/03/2020   ALBUMIN 3.2 (L) 04/20/2019    No results found for: MG No results found for: VD25OH  No results found for: PREALBUMIN CBC EXTENDED Latest Ref Rng & Units 06/03/2020 03/31/2020 04/20/2019  WBC 4.0 - 10.5 K/uL 8.4 - 6.3  RBC 3.87 - 5.11 MIL/uL 4.51 - 4.62  HGB 12.0  - 15.0 g/dL 12.5 14.3 13.6  HCT 36.0 - 46.0 % 40.3 42.0 41.8  PLT 150 - 400 K/uL 343 - 226  NEUTROABS 1.7 - 7.7 K/uL 5.8 - 5.3  LYMPHSABS 0.7 - 4.0 K/uL 1.6 - 0.5(L)     There is no height or weight on file to calculate BMI.  Orders:  No orders of the defined types were placed in this encounter.  No orders of the defined types were placed in this encounter.    Procedures: No procedures performed  Clinical Data: No additional findings.  ROS:  All other systems negative, except as noted in the HPI. Review of Systems  Objective: Vital Signs: There were no vitals taken for this visit.  Specialty Comments:  No specialty comments available.  PMFS History: Patient Active Problem List   Diagnosis Date Noted  . Diabetic ulcer of left great toe ([place])   . DM (diabetes mellitus), type 2 with peripheral vascular complications ([place]) 74/94/4967  . HLD (hyperlipidemia) 06/03/2020  . HTN (hypertension) 06/03/2020  . Osteomyelitis of great toe of left foot ([place]) 06/03/2020  . Cellulitis of left foot 06/03/2020  . Atherosclerosis of native artery of left lower extremity with ulceration ([place]) 03/30/2020  . DJD (degenerative joint  disease) 04/21/2014   Past Medical History:  Diagnosis Date  . Arthritis    OSTEO  . Diabetes mellitus without complication ([place])    TYPE 2  . Family history of adverse reaction to anesthesia    Mother - N/V  . Goiter   . Hyperlipidemia   . Hypertension   . PONV (postoperative nausea and vomiting)   . Retinopathy     Family History  Problem Relation Age of Onset  . Healthy Mother   . Healthy Father     Past Surgical History:  Procedure Laterality Date  . ABDOMINAL AORTOGRAM W/LOWER EXTREMITY N/A 03/31/2020   Procedure: ABDOMINAL AORTOGRAM W/LOWER EXTREMITY;  Surgeon: Cherre Robins, MD;  Location: [facility];  Service: Cardiovascular;  Laterality: N/A;  . ABDOMINAL HYSTERECTOMY    . AIR/FLUID EXCHANGE Left 11/30/2014   Procedure:  AIR/FLUID EXCHANGE LEFT EYE;  Surgeon: Hurman Horn, MD;  Location: [facility];  Service: Ophthalmology;  Laterality: Left;  . AMPUTATION Left 06/11/2020   Procedure: LEFT GREAT TOE AMPUTATION;  Surgeon: Newt Minion, MD;  Location: [facility];  Service: Orthopedics;  Laterality: Left;  . BACK SURGERY    . CESAREAN SECTION     x 2  . EYE SURGERY    . INJECTION OF SILICONE OIL Right 12/02/5174   Procedure: INJECTION OF SILICONE OIL;  Surgeon: Hurman Horn, MD;  Location: [facility];  Service: Ophthalmology;  Laterality: Right;  . INJECTION OF SILICONE OIL Left 1/60/7371   Procedure: INJECTION OF SILICONE OIL;  Surgeon: Hurman Horn, MD;  Location: [facility];  Service: Ophthalmology;  Laterality: Left;  . LASER PHOTO ABLATION Right 05/26/2014   Procedure: LASER PHOTO ABLATION;  Surgeon: Hurman Horn, MD;  Location: [facility];  Service: Ophthalmology;  Laterality: Right;  . PARS PLANA VITRECTOMY Right 05/26/2014   Procedure: PARS PLANA VITRECTOMY 25 GAUGE;  Surgeon: Hurman Horn, MD;  Location: [facility];  Service: Ophthalmology;  Laterality: Right;  . PARS PLANA VITRECTOMY Left 11/30/2014   Procedure: PARS PLANA VITRECTOMY WITH 25 GAUGE with endolaser;  Surgeon: Hurman Horn, MD;  Location: [facility];  Service: Ophthalmology;  Laterality: Left;  . PERIPHERAL VASCULAR INTERVENTION Left 03/31/2020   Procedure: PERIPHERAL VASCULAR INTERVENTION;  Surgeon: Cherre Robins, MD;  Location: [facility];  Service: Cardiovascular;  Laterality: Left;  superficial femoral  . TONSILLECTOMY    . TOTAL HIP ARTHROPLASTY Right 04/21/2014   dr Percell Miller  . TOTAL HIP ARTHROPLASTY Right 04/21/2014   Procedure: RIGHT TOTAL HIP ARTHROPLASTY ANTERIOR APPROACH;  Surgeon: Renette Butters, MD;  Location: [facility];  Service: Orthopedics;  Laterality: Right;   Social History   Occupational History  . Not on file  Tobacco Use  . Smoking status: Former Research scientist (life sciences)  . Smokeless tobacco: Never Used  . Tobacco comment: smoked as a teenager  Vaping  Use  . Vaping Use: Never used  Substance and Sexual Activity  . Alcohol use: No  . Drug use: No  . Sexual activity: Not on file

## 2020-07-22 DIAGNOSIS — E012 Iodine-deficiency related (endemic) goiter, unspecified: Secondary | ICD-10-CM | POA: Diagnosis not present

## 2020-07-22 DIAGNOSIS — E11319 Type 2 diabetes mellitus with unspecified diabetic retinopathy without macular edema: Secondary | ICD-10-CM | POA: Diagnosis not present

## 2020-07-22 DIAGNOSIS — E782 Mixed hyperlipidemia: Secondary | ICD-10-CM | POA: Diagnosis not present

## 2020-07-22 DIAGNOSIS — Z79899 Other long term (current) drug therapy: Secondary | ICD-10-CM | POA: Diagnosis not present

## 2020-07-22 DIAGNOSIS — I10 Essential (primary) hypertension: Secondary | ICD-10-CM | POA: Diagnosis not present

## 2020-07-22 DIAGNOSIS — Z0001 Encounter for general adult medical examination with abnormal findings: Secondary | ICD-10-CM | POA: Diagnosis not present

## 2020-07-27 DIAGNOSIS — Z0001 Encounter for general adult medical examination with abnormal findings: Secondary | ICD-10-CM | POA: Diagnosis not present

## 2020-07-27 DIAGNOSIS — E782 Mixed hyperlipidemia: Secondary | ICD-10-CM | POA: Diagnosis not present

## 2020-07-27 DIAGNOSIS — E048 Other specified nontoxic goiter: Secondary | ICD-10-CM | POA: Diagnosis not present

## 2020-07-27 DIAGNOSIS — R944 Abnormal results of kidney function studies: Secondary | ICD-10-CM | POA: Diagnosis not present

## 2020-08-09 ENCOUNTER — Ambulatory Visit (INDEPENDENT_AMBULATORY_CARE_PROVIDER_SITE_OTHER): Payer: Medicare Other | Admitting: Physician Assistant

## 2020-08-09 ENCOUNTER — Encounter: Payer: Self-pay | Admitting: Orthopedic Surgery

## 2020-08-09 DIAGNOSIS — M869 Osteomyelitis, unspecified: Secondary | ICD-10-CM

## 2020-08-09 NOTE — Progress Notes (Signed)
Office Visit Note   Patient: [name]           Date of Birth: 1956-02-12           MRN: [id number] Visit Date: 08/09/2020              Requested by: Celene Squibb, MD [place],   [id number] PCP: Celene Squibb, MD  Chief Complaint  Patient presents with  . Left Foot - Routine Post Op    06/11/20 left GT amputation       HPI: Patient presents today 2 months status post left great toe amputation.  She has been wearing her short compression socks.  She is wearing crocs but has brought a new supportive pair of tennis shoes that she would like to transition to.  Assessment & Plan: Visit Diagnoses: No diagnosis found.  Plan: Follow-up for what I think will be her final visit in 1 month.  She should be careful with new shoes making sure they are fitting appropriately and are supportive  Follow-Up Instructions: No follow-ups on file.   Ortho Exam  Patient is alert, oriented, no adenopathy, well-dressed, normal affect, normal respiratory effort. Great toe amputation site.  She did have some wound dehiscence but this has failed and she just has a very spot amount of clear drainage.  No ascending cellulitis or signs of infection swelling is quite well controlled  Imaging: No results found. No images are attached to the encounter.  Labs: Lab Results  Component Value Date   HGBA1C 7.2 (H) 06/03/2020     Lab Results  Component Value Date   ALBUMIN 3.6 06/03/2020   ALBUMIN 3.2 (L) 04/20/2019    No results found for: MG No results found for: VD25OH  No results found for: PREALBUMIN CBC EXTENDED Latest Ref Rng & Units 06/03/2020 03/31/2020 04/20/2019  WBC 4.0 - 10.5 K/uL 8.4 - 6.3  RBC 3.87 - 5.11 MIL/uL 4.51 - 4.62  HGB 12.0 - 15.0 g/dL 12.5 14.3 13.6  HCT 36.0 - 46.0 % 40.3 42.0 41.8  PLT 150 - 400 K/uL 343 - 226  NEUTROABS 1.7 - 7.7 K/uL 5.8 - 5.3  LYMPHSABS 0.7 - 4.0 K/uL 1.6 - 0.5(L)     There is no height or weight on file to calculate  BMI.  Orders:  No orders of the defined types were placed in this encounter.  No orders of the defined types were placed in this encounter.    Procedures: No procedures performed  Clinical Data: No additional findings.  ROS:  All other systems negative, except as noted in the HPI. Review of Systems  Objective: Vital Signs: There were no vitals taken for this visit.  Specialty Comments:  No specialty comments available.  PMFS History: Patient Active Problem List   Diagnosis Date Noted  . Diabetic ulcer of left great toe ([place])   . DM (diabetes mellitus), type 2 with peripheral vascular complications ([place]) 62/13/0865  . HLD (hyperlipidemia) 06/03/2020  . HTN (hypertension) 06/03/2020  . Osteomyelitis of great toe of left foot ([place]) 06/03/2020  . Cellulitis of left foot 06/03/2020  . Atherosclerosis of native artery of left lower extremity with ulceration ([place]) 03/30/2020  . DJD (degenerative joint disease) 04/21/2014   Past Medical History:  Diagnosis Date  . Arthritis    OSTEO  . Diabetes mellitus without complication ([place])    TYPE 2  . Family history of adverse reaction to anesthesia  Mother - N/V  . Goiter   . Hyperlipidemia   . Hypertension   . PONV (postoperative nausea and vomiting)   . Retinopathy     Family History  Problem Relation Age of Onset  . Healthy Mother   . Healthy Father     Past Surgical History:  Procedure Laterality Date  . ABDOMINAL AORTOGRAM W/LOWER EXTREMITY N/A 03/31/2020   Procedure: ABDOMINAL AORTOGRAM W/LOWER EXTREMITY;  Surgeon: Cherre Robins, MD;  Location: [facility];  Service: Cardiovascular;  Laterality: N/A;  . ABDOMINAL HYSTERECTOMY    . AIR/FLUID EXCHANGE Left 11/30/2014   Procedure: AIR/FLUID EXCHANGE LEFT EYE;  Surgeon: Hurman Horn, MD;  Location: [facility];  Service: Ophthalmology;  Laterality: Left;  . AMPUTATION Left 06/11/2020   Procedure: LEFT GREAT TOE AMPUTATION;  Surgeon: Newt Minion, MD;   Location: [facility];  Service: Orthopedics;  Laterality: Left;  . BACK SURGERY    . CESAREAN SECTION     x 2  . EYE SURGERY    . INJECTION OF SILICONE OIL Right 0/26/3785   Procedure: INJECTION OF SILICONE OIL;  Surgeon: Hurman Horn, MD;  Location: [facility];  Service: Ophthalmology;  Laterality: Right;  . INJECTION OF SILICONE OIL Left 8/85/0277   Procedure: INJECTION OF SILICONE OIL;  Surgeon: Hurman Horn, MD;  Location: [facility];  Service: Ophthalmology;  Laterality: Left;  . LASER PHOTO ABLATION Right 05/26/2014   Procedure: LASER PHOTO ABLATION;  Surgeon: Hurman Horn, MD;  Location: [facility];  Service: Ophthalmology;  Laterality: Right;  . PARS PLANA VITRECTOMY Right 05/26/2014   Procedure: PARS PLANA VITRECTOMY 25 GAUGE;  Surgeon: Hurman Horn, MD;  Location: [facility];  Service: Ophthalmology;  Laterality: Right;  . PARS PLANA VITRECTOMY Left 11/30/2014   Procedure: PARS PLANA VITRECTOMY WITH 25 GAUGE with endolaser;  Surgeon: Hurman Horn, MD;  Location: [facility];  Service: Ophthalmology;  Laterality: Left;  . PERIPHERAL VASCULAR INTERVENTION Left 03/31/2020   Procedure: PERIPHERAL VASCULAR INTERVENTION;  Surgeon: Cherre Robins, MD;  Location: [facility];  Service: Cardiovascular;  Laterality: Left;  superficial femoral  . TONSILLECTOMY    . TOTAL HIP ARTHROPLASTY Right 04/21/2014   dr Percell Miller  . TOTAL HIP ARTHROPLASTY Right 04/21/2014   Procedure: RIGHT TOTAL HIP ARTHROPLASTY ANTERIOR APPROACH;  Surgeon: Renette Butters, MD;  Location: [facility];  Service: Orthopedics;  Laterality: Right;   Social History   Occupational History  . Not on file  Tobacco Use  . Smoking status: Former Research scientist (life sciences)  . Smokeless tobacco: Never Used  . Tobacco comment: smoked as a teenager  Vaping Use  . Vaping Use: Never used  Substance and Sexual Activity  . Alcohol use: No  . Drug use: No  . Sexual activity: Not on file

## 2020-08-18 DIAGNOSIS — E782 Mixed hyperlipidemia: Secondary | ICD-10-CM | POA: Diagnosis not present

## 2020-09-06 ENCOUNTER — Other Ambulatory Visit: Payer: Self-pay

## 2020-09-06 ENCOUNTER — Ambulatory Visit (INDEPENDENT_AMBULATORY_CARE_PROVIDER_SITE_OTHER): Payer: Medicare Other | Admitting: Physician Assistant

## 2020-09-06 ENCOUNTER — Encounter: Payer: Self-pay | Admitting: Orthopedic Surgery

## 2020-09-06 DIAGNOSIS — M869 Osteomyelitis, unspecified: Secondary | ICD-10-CM

## 2020-09-06 NOTE — Progress Notes (Signed)
Office Visit Note   Patient: [name]           Date of Birth: September 22, 1955           MRN: [id number] Visit Date: 09/06/2020              Requested by: Celene Squibb, MD [place],   [id number] PCP: Celene Squibb, MD  Chief Complaint  Patient presents with  . Left Foot - Follow-up    GT F/U      HPI: Patient is a pleasant 65 year old woman who is 3 months status post left great toe amputation she reports she is doing well has not had any drainage.  She puts lotion on her foot every day  Assessment & Plan: Visit Diagnoses: No diagnosis found.  Plan: May follow-up as needed encouraged her to contact us if she has any changes make sure she is inspecting her feet every day  Follow-Up Instructions: No follow-ups on file.   Ortho Exam  Patient is alert, oriented, no adenopathy, well-dressed, normal affect, normal respiratory effort. Left great toe amputation is completely healed there is a small thin eschar no drainage no surrounding cellulitis no ascending cellulitis no swelling or signs of infection  Imaging: No results found. No images are attached to the encounter.  Labs: Lab Results  Component Value Date   HGBA1C 7.2 (H) 06/03/2020     Lab Results  Component Value Date   ALBUMIN 3.6 06/03/2020   ALBUMIN 3.2 (L) 04/20/2019    No results found for: MG No results found for: VD25OH  No results found for: PREALBUMIN CBC EXTENDED Latest Ref Rng & Units 06/03/2020 03/31/2020 04/20/2019  WBC 4.0 - 10.5 K/uL 8.4 - 6.3  RBC 3.87 - 5.11 MIL/uL 4.51 - 4.62  HGB 12.0 - 15.0 g/dL 12.5 14.3 13.6  HCT 36.0 - 46.0 % 40.3 42.0 41.8  PLT 150 - 400 K/uL 343 - 226  NEUTROABS 1.7 - 7.7 K/uL 5.8 - 5.3  LYMPHSABS 0.7 - 4.0 K/uL 1.6 - 0.5(L)     There is no height or weight on file to calculate BMI.  Orders:  No orders of the defined types were placed in this encounter.  No orders of the defined types were placed in this encounter.     Procedures: No procedures performed  Clinical Data: No additional findings.  ROS:  All other systems negative, except as noted in the HPI. Review of Systems  Objective: Vital Signs: There were no vitals taken for this visit.  Specialty Comments:  No specialty comments available.  PMFS History: Patient Active Problem List   Diagnosis Date Noted  . Diabetic ulcer of left great toe ([place])   . DM (diabetes mellitus), type 2 with peripheral vascular complications ([place]) 73/41/9379  . HLD (hyperlipidemia) 06/03/2020  . HTN (hypertension) 06/03/2020  . Osteomyelitis of great toe of left foot ([place]) 06/03/2020  . Cellulitis of left foot 06/03/2020  . Atherosclerosis of native artery of left lower extremity with ulceration ([place]) 03/30/2020  . DJD (degenerative joint disease) 04/21/2014   Past Medical History:  Diagnosis Date  . Arthritis    OSTEO  . Diabetes mellitus without complication ([place])    TYPE 2  . Family history of adverse reaction to anesthesia    Mother - N/V  . Goiter   . Hyperlipidemia   . Hypertension   . PONV (postoperative nausea and vomiting)   . Retinopathy  Family History  Problem Relation Age of Onset  . Healthy Mother   . Healthy Father     Past Surgical History:  Procedure Laterality Date  . ABDOMINAL AORTOGRAM W/LOWER EXTREMITY N/A 03/31/2020   Procedure: ABDOMINAL AORTOGRAM W/LOWER EXTREMITY;  Surgeon: Cherre Robins, MD;  Location: [facility];  Service: Cardiovascular;  Laterality: N/A;  . ABDOMINAL HYSTERECTOMY    . AIR/FLUID EXCHANGE Left 11/30/2014   Procedure: AIR/FLUID EXCHANGE LEFT EYE;  Surgeon: Hurman Horn, MD;  Location: [facility];  Service: Ophthalmology;  Laterality: Left;  . AMPUTATION Left 06/11/2020   Procedure: LEFT GREAT TOE AMPUTATION;  Surgeon: Newt Minion, MD;  Location: [facility];  Service: Orthopedics;  Laterality: Left;  . BACK SURGERY    . CESAREAN SECTION     x 2  . EYE SURGERY    . INJECTION OF SILICONE OIL  Right 5/63/1497   Procedure: INJECTION OF SILICONE OIL;  Surgeon: Hurman Horn, MD;  Location: [facility];  Service: Ophthalmology;  Laterality: Right;  . INJECTION OF SILICONE OIL Left 0/26/3785   Procedure: INJECTION OF SILICONE OIL;  Surgeon: Hurman Horn, MD;  Location: [facility];  Service: Ophthalmology;  Laterality: Left;  . LASER PHOTO ABLATION Right 05/26/2014   Procedure: LASER PHOTO ABLATION;  Surgeon: Hurman Horn, MD;  Location: [facility];  Service: Ophthalmology;  Laterality: Right;  . PARS PLANA VITRECTOMY Right 05/26/2014   Procedure: PARS PLANA VITRECTOMY 25 GAUGE;  Surgeon: Hurman Horn, MD;  Location: [facility];  Service: Ophthalmology;  Laterality: Right;  . PARS PLANA VITRECTOMY Left 11/30/2014   Procedure: PARS PLANA VITRECTOMY WITH 25 GAUGE with endolaser;  Surgeon: Hurman Horn, MD;  Location: [facility];  Service: Ophthalmology;  Laterality: Left;  . PERIPHERAL VASCULAR INTERVENTION Left 03/31/2020   Procedure: PERIPHERAL VASCULAR INTERVENTION;  Surgeon: Cherre Robins, MD;  Location: [facility];  Service: Cardiovascular;  Laterality: Left;  superficial femoral  . TONSILLECTOMY    . TOTAL HIP ARTHROPLASTY Right 04/21/2014   dr Percell Miller  . TOTAL HIP ARTHROPLASTY Right 04/21/2014   Procedure: RIGHT TOTAL HIP ARTHROPLASTY ANTERIOR APPROACH;  Surgeon: Renette Butters, MD;  Location: [facility];  Service: Orthopedics;  Laterality: Right;   Social History   Occupational History  . Not on file  Tobacco Use  . Smoking status: Former Research scientist (life sciences)  . Smokeless tobacco: Never Used  . Tobacco comment: smoked as a teenager  Vaping Use  . Vaping Use: Never used  Substance and Sexual Activity  . Alcohol use: No  . Drug use: No  . Sexual activity: Not on file

## 2020-09-14 ENCOUNTER — Other Ambulatory Visit: Payer: Self-pay

## 2020-09-14 ENCOUNTER — Encounter (INDEPENDENT_AMBULATORY_CARE_PROVIDER_SITE_OTHER): Payer: Self-pay | Admitting: Ophthalmology

## 2020-09-14 ENCOUNTER — Ambulatory Visit (INDEPENDENT_AMBULATORY_CARE_PROVIDER_SITE_OTHER): Payer: Medicare Other | Admitting: Ophthalmology

## 2020-09-14 DIAGNOSIS — Z9889 Other specified postprocedural states: Secondary | ICD-10-CM | POA: Insufficient documentation

## 2020-09-14 DIAGNOSIS — E113551 Type 2 diabetes mellitus with stable proliferative diabetic retinopathy, right eye: Secondary | ICD-10-CM

## 2020-09-14 DIAGNOSIS — E113552 Type 2 diabetes mellitus with stable proliferative diabetic retinopathy, left eye: Secondary | ICD-10-CM

## 2020-09-14 NOTE — Assessment & Plan Note (Signed)
Quiet PDR OU.  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  Nonetheless patient would like some retained silicone oil removal right eye and subsequent left eye.

## 2020-09-14 NOTE — Progress Notes (Signed)
09/14/2020     CHIEF COMPLAINT Patient presents for Retina Follow Up (3-4 years- wants check up- (had sx 6 years ago has silicone oil in OU)/Pt states, "My vision seems to be pretty good. I have to hold things close to see it and I do notice that things run together but I think that is the oil distorting my vision."/A1C: 7.5/LBS:140)   HISTORY OF PRESENT ILLNESS: [name] is a 65 y.o. female who presents to the clinic today for:   HPI     Retina Follow Up           Diagnosis: Other   Laterality: both eyes   Onset: 4 years ago   Severity: mild   Duration: 4 years   Course: stable   Comments: 3-4 years- wants check up- (had sx 6 years ago has silicone oil in OU) Pt states, "My vision seems to be pretty good. I have to hold things close to see it and I do notice that things run together but I think that is the oil distorting my vision." A1C: 7.5 LBS:140       Last edited by Kendra Opitz, COA on 09/14/2020  8:39 AM.      Referring physician: Celene Squibb, MD [place],  [place] [address]  HISTORICAL INFORMATION:   Selected notes from the MEDICAL RECORD NUMBER    Lab Results  Component Value Date   HGBA1C 7.2 (H) 06/03/2020     CURRENT MEDICATIONS: No current outpatient medications on file. (Ophthalmic Drugs)   No current facility-administered medications for this visit. (Ophthalmic Drugs)   Current Outpatient Medications (Other)  Medication Sig   acidophilus (RISAQUAD) CAPS capsule Take 1 capsule by mouth daily.   aspirin 81 MG EC tablet TAKE 1 TABLET (81 MG TOTAL) BY MOUTH DAILY. SWALLOW WHOLE.   aspirin EC 81 MG tablet Take 1 tablet (81 mg total) by mouth daily. Swallow whole.   atorvastatin (LIPITOR) 10 MG tablet Take 10 mg by mouth daily.   b complex vitamins tablet Take 1 tablet by mouth daily.   clopidogrel (PLAVIX) 75 MG tablet Take 1 tablet (75 mg total) by mouth daily.   doxycycline (VIBRA-TABS) 100 MG tablet Take 1 tablet (100  mg total) by mouth 2 (two) times daily.   glipiZIDE (GLUCOTROL) 10 MG tablet Take 10 mg by mouth 2 (two) times daily.   HYDROcodone-acetaminophen (NORCO/VICODIN) 5-325 MG tablet Take 1 tablet by mouth every 4 (four) hours as needed for moderate pain.   lisinopril-hydrochlorothiazide (ZESTORETIC) 20-12.5 MG tablet Take 2 tablets by mouth daily.   Multiple Vitamins-Minerals (MULTIVITAMIN PO) Take 1 tablet by mouth daily.   Multiple Vitamins-Minerals (OCUVITE ADULT 50+ PO) Take 1 tablet by mouth daily.   naproxen sodium (ALEVE) 220 MG tablet Take 220 mg by mouth 2 (two) times daily with a meal.   Omega 3-6-9 Fatty Acids (OMEGA 3-6-9 COMPLEX PO) Take 1 capsule by mouth 2 (two) times daily.   SANTYL ointment Apply 1 application topically daily.   sitaGLIPtin-metformin (JANUMET) 50-1000 MG per tablet Take 1 tablet by mouth 2 (two) times daily with a meal.   No current facility-administered medications for this visit. (Other)      REVIEW OF SYSTEMS:    ALLERGIES Allergies  Allergen Reactions   Anesthetics, Amide Nausea And Vomiting   Codeine Nausea And Vomiting    PAST MEDICAL HISTORY Past Medical History:  Diagnosis Date   Arthritis    OSTEO  Diabetes mellitus without complication ([place])    TYPE 2   Family history of adverse reaction to anesthesia    Mother - N/V   Goiter    Hyperlipidemia    Hypertension    PONV (postoperative nausea and vomiting)    Retinopathy    Past Surgical History:  Procedure Laterality Date   ABDOMINAL AORTOGRAM W/LOWER EXTREMITY N/A 03/31/2020   Procedure: ABDOMINAL AORTOGRAM W/LOWER EXTREMITY;  Surgeon: Cherre Robins, MD;  Location: [facility];  Service: Cardiovascular;  Laterality: N/A;   ABDOMINAL HYSTERECTOMY     AIR/FLUID EXCHANGE Left 11/30/2014   Procedure: AIR/FLUID EXCHANGE LEFT EYE;  Surgeon: Hurman Horn, MD;  Location: [facility];  Service: Ophthalmology;  Laterality: Left;   AMPUTATION Left 06/11/2020   Procedure: LEFT GREAT TOE  AMPUTATION;  Surgeon: Newt Minion, MD;  Location: [facility];  Service: Orthopedics;  Laterality: Left;   BACK SURGERY     CESAREAN SECTION     x 2   EYE SURGERY     INJECTION OF SILICONE OIL Right 0/06/7046   Procedure: INJECTION OF SILICONE OIL;  Surgeon: Hurman Horn, MD;  Location: [facility];  Service: Ophthalmology;  Laterality: Right;   INJECTION OF SILICONE OIL Left 8/89/1694   Procedure: INJECTION OF SILICONE OIL;  Surgeon: Hurman Horn, MD;  Location: [facility];  Service: Ophthalmology;  Laterality: Left;   LASER PHOTO ABLATION Right 05/26/2014   Procedure: LASER PHOTO ABLATION;  Surgeon: Hurman Horn, MD;  Location: [facility];  Service: Ophthalmology;  Laterality: Right;   PARS PLANA VITRECTOMY Right 05/26/2014   Procedure: PARS PLANA VITRECTOMY 25 GAUGE;  Surgeon: Hurman Horn, MD;  Location: [facility];  Service: Ophthalmology;  Laterality: Right;   PARS PLANA VITRECTOMY Left 11/30/2014   Procedure: PARS PLANA VITRECTOMY WITH 25 GAUGE with endolaser;  Surgeon: Hurman Horn, MD;  Location: [facility];  Service: Ophthalmology;  Laterality: Left;   PERIPHERAL VASCULAR INTERVENTION Left 03/31/2020   Procedure: PERIPHERAL VASCULAR INTERVENTION;  Surgeon: Cherre Robins, MD;  Location: [facility];  Service: Cardiovascular;  Laterality: Left;  superficial femoral   TONSILLECTOMY     TOTAL HIP ARTHROPLASTY Right 04/21/2014   dr Percell Miller   TOTAL HIP ARTHROPLASTY Right 04/21/2014   Procedure: RIGHT TOTAL HIP ARTHROPLASTY ANTERIOR APPROACH;  Surgeon: Renette Butters, MD;  Location: [facility];  Service: Orthopedics;  Laterality: Right;    FAMILY HISTORY Family History  Problem Relation Age of Onset   Healthy Mother    Healthy Father     SOCIAL HISTORY Social History   Tobacco Use   Smoking status: Former    Pack years: 0.00   Smokeless tobacco: Never   Tobacco comments:    smoked as a teenager  Scientific laboratory 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 used  Substance Use Topics   Alcohol use: No   Drug use: No          OPHTHALMIC EXAM:  Base Eye Exam     Visual Acuity (ETDRS)       Right Left   Dist cc 20/200 20/400   Dist ph cc NI 20/200    Correction: Glasses         Tonometry (Tonopen, 8:44 AM)       Right Left   Pressure 28 24         Tonometry #2 (Tonopen, 8:44 AM)       Right Left   Pressure 27  Pupils       Pupils Dark Light Shape React APD   Right PERRL 4 4 Round Minimal None   Left PERRL 3 3 Round Minimal None         Visual Fields       Left Right   Restrictions Partial outer superior temporal, inferior temporal deficiencies Partial outer inferior temporal deficiency         Extraocular Movement       Right Left    Full Full         Neuro/Psych     Oriented x3: Yes         Dilation     Both eyes: 1.0% Mydriacyl, 2.5% Phenylephrine @ 8:44 AM           Slit Lamp and Fundus Exam     External Exam       Right Left   External Normal Normal         Slit Lamp Exam       Right Left   Lids/Lashes Normal Normal   Conjunctiva/Sclera White and quiet White and quiet   Cornea Clear Clear   Anterior Chamber Deep and quiet Deep and quiet   Iris Round and reactive Round and reactive   Lens Centered posterior chamber intraocular lens Centered posterior chamber intraocular lens   Anterior Vitreous Normal, clear Normal         Fundus Exam       Right Left   Posterior Vitreous Clear silicone, 291% fill Clear silicone, 916% fill   Disc Normal Normal   C/D Ratio 0.65 0.65   Macula Good focal laser scars, attached, atrophy present centrally Good focal laser scars, attached, atrophy present centrally   Vessels Quiet PDR, good PRP peripherally Quiet PDR, good PRP peripherally   Periphery Attached, good PRP 360 Attached, good PRP 360            IMAGING AND PROCEDURES  Imaging and Procedures for 09/14/20  OCT, Retina - OU - Both Eyes       Right Eye Quality was good. Scan locations included subfoveal. Central  Foveal Thickness: 215. Progression has been stable. Findings include abnormal foveal contour, outer retinal atrophy, inner retinal atrophy, central retinal atrophy.   Left Eye Quality was good. Scan locations included subfoveal. Central Foveal Thickness: 272. Progression has been stable. Findings include abnormal foveal contour, central retinal atrophy, inner retinal atrophy, outer retinal atrophy.              ASSESSMENT/PLAN:  History of vitrectomy Retained silicone oil in each eye.  Stabilization of previous proliferative diabetic retinopathy and recurrences of vitreous hemorrhages OU.  Patient returns and asked whether we can take to a lot to maximize visual potential.  Stable treated proliferative diabetic retinopathy of right eye determined by examination associated with type 2 diabetes mellitus ([place]) Quiet PDR OU.  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  Nonetheless patient would like some retained silicone oil removal right eye and subsequent left eye.     ICD-10-CM   1. Stable treated proliferative diabetic retinopathy of right eye determined by examination associated with type 2 diabetes mellitus ([place])  E11.3551 OCT, Retina - OU - Both Eyes    2. Stable treated proliferative diabetic retinopathy of left eye determined by examination associated with type 2 diabetes mellitus ([place])  O06.0045 OCT, Retina - OU - Both Eyes  3. History of vitrectomy  Z98.890       1.  We will plan vitrectomy, focal laser evacuation of silicone oil right eye initially, with eye that has chance for best visual acuity.  Understands that there is likely that she will still need glasses afterwards and doing magnifiers to maximize capability.  While there is likely that no subsequent vitreous hemorrhages will occur with excellent diabetes mellitus management, it is possible upon silicone oil removal.  2.  We will  plan vitrectomy right eye initially in the coming weeks thereafter attention may be drawn to the left eye  3.  We will schedule preop for vitrectomy focal laser right eye, evacuation of silicone oil right eye under local MAC  Ophthalmic Meds Ordered this visit:  No orders of the defined types were placed in this encounter.      Return ,, [facility], for Schedule vitrectomy, removal of retained silicone oil, endolaser focal , OD.  There are no Patient Instructions on file for this visit.   Explained the diagnoses, plan, and follow up with the patient and they expressed understanding.  Patient expressed understanding of the importance of proper follow up care.   Clent Demark Johonna Binette M.D. Diseases & Surgery of the Retina and Vitreous Retina & Diabetic [place] [facility] 09/14/20     Abbreviations: M myopia (nearsighted); A astigmatism; H hyperopia (farsighted); P presbyopia; Mrx spectacle prescription;  CTL contact lenses; OD right eye; OS left eye; OU both eyes  XT exotropia; ET esotropia; PEK punctate epithelial keratitis; PEE punctate epithelial erosions; DES dry eye syndrome; MGD meibomian gland dysfunction; ATs artificial tears; PFAT's preservative free artificial tears; [place] nuclear sclerotic cataract; PSC posterior subcapsular cataract; ERM epi-retinal membrane; PVD posterior vitreous detachment; RD retinal detachment; DM diabetes mellitus; DR diabetic retinopathy; NPDR non-proliferative diabetic retinopathy; PDR proliferative diabetic retinopathy; CSME clinically significant macular edema; DME diabetic macular edema; dbh dot blot hemorrhages; CWS cotton wool spot; POAG primary open angle glaucoma; C/D cup-to-disc ratio; HVF humphrey visual field; GVF goldmann visual field; OCT optical coherence tomography; IOP intraocular pressure; BRVO Branch retinal vein occlusion; CRVO central retinal vein occlusion; CRAO central retinal artery occlusion; BRAO branch retinal artery occlusion; RT  retinal tear; SB scleral buckle; PPV pars plana vitrectomy; VH Vitreous hemorrhage; PRP panretinal laser photocoagulation; IVK intravitreal kenalog; VMT vitreomacular traction; MH Macular hole;  NVD neovascularization of the disc; NVE neovascularization elsewhere; AREDS age related eye disease study; ARMD age related macular degeneration; POAG primary open angle glaucoma; EBMD epithelial/anterior basement membrane dystrophy; ACIOL anterior chamber intraocular lens; IOL intraocular lens; PCIOL posterior chamber intraocular lens; Phaco/IOL phacoemulsification with intraocular lens placement; [place] photorefractive keratectomy; LASIK laser assisted in situ keratomileusis; HTN hypertension; DM diabetes mellitus; COPD chronic obstructive pulmonary disease

## 2020-09-14 NOTE — Assessment & Plan Note (Signed)
Retained silicone oil in each eye.  Stabilization of previous proliferative diabetic retinopathy and recurrences of vitreous hemorrhages OU.  Patient returns and asked whether we can take to a lot to maximize visual potential.

## 2020-09-22 ENCOUNTER — Ambulatory Visit (INDEPENDENT_AMBULATORY_CARE_PROVIDER_SITE_OTHER): Payer: Medicare Other

## 2020-09-30 ENCOUNTER — Encounter (INDEPENDENT_AMBULATORY_CARE_PROVIDER_SITE_OTHER): Payer: Medicare Other | Admitting: Ophthalmology

## 2020-10-06 ENCOUNTER — Ambulatory Visit (INDEPENDENT_AMBULATORY_CARE_PROVIDER_SITE_OTHER): Payer: Medicare Other

## 2020-10-06 ENCOUNTER — Other Ambulatory Visit: Payer: Self-pay

## 2020-10-06 DIAGNOSIS — E113541 Type 2 diabetes mellitus with proliferative diabetic retinopathy with combined traction retinal detachment and rhegmatogenous retinal detachment, right eye: Secondary | ICD-10-CM

## 2020-10-06 MED ORDER — OFLOXACIN 0.3 % OP SOLN: 1.0000 [drp] | Freq: Four times a day (QID) | OPHTHALMIC | 0 refills | Status: AC

## 2020-10-06 MED ORDER — PREDNISOLONE ACETATE 1 % OP SUSP: 1.0000 [drp] | Freq: Four times a day (QID) | OPHTHALMIC | 0 refills | Status: AC

## 2020-10-06 NOTE — Progress Notes (Signed)
10/06/2020     CHIEF COMPLAINT Patient presents for Pre-op Exam (Pt will have PPV/Endolaser and silicone oil removal OD on 10/27/2020 )   HISTORY OF PRESENT ILLNESS: [name] is a 65 y.o. female who presents to the clinic today for:   HPI     Pre-op Exam           Comments: Pt will have PPV/Endolaser and silicone oil removal OD on 10/27/2020        Last edited by Kendra Opitz, COA on 10/06/2020  1:59 PM.        HISTORICAL INFORMATION:   Selected notes from the MEDICAL RECORD NUMBER    Lab Results  Component Value Date   HGBA1C 7.2 (H) 06/03/2020     CURRENT MEDICATIONS: Current Outpatient Medications (Ophthalmic Drugs)  Medication Sig   ofloxacin (OCUFLOX) 0.3 % ophthalmic solution Place 1 drop into the right eye in the morning, at noon, in the evening, and at bedtime for 21 doses.   prednisoLONE acetate (PRED FORTE) 1 % ophthalmic suspension Place 1 drop into the right eye 4 (four) times daily for 21 doses.   No current facility-administered medications for this visit. (Ophthalmic Drugs)   Current Outpatient Medications (Other)  Medication Sig   acidophilus (RISAQUAD) CAPS capsule Take 1 capsule by mouth daily.   aspirin 81 MG EC tablet TAKE 1 TABLET (81 MG TOTAL) BY MOUTH DAILY. SWALLOW WHOLE.   aspirin EC 81 MG tablet Take 1 tablet (81 mg total) by mouth daily. Swallow whole.   atorvastatin (LIPITOR) 10 MG tablet Take 10 mg by mouth daily.   b complex vitamins tablet Take 1 tablet by mouth daily.   clopidogrel (PLAVIX) 75 MG tablet Take 1 tablet (75 mg total) by mouth daily.   doxycycline (VIBRA-TABS) 100 MG tablet Take 1 tablet (100 mg total) by mouth 2 (two) times daily.   glipiZIDE (GLUCOTROL) 10 MG tablet Take 10 mg by mouth 2 (two) times daily.   HYDROcodone-acetaminophen (NORCO/VICODIN) 5-325 MG tablet Take 1 tablet by mouth every 4 (four) hours as needed for moderate pain.   lisinopril-hydrochlorothiazide (ZESTORETIC) 20-12.5 MG tablet Take 2  tablets by mouth daily.   Multiple Vitamins-Minerals (MULTIVITAMIN PO) Take 1 tablet by mouth daily.   Multiple Vitamins-Minerals (OCUVITE ADULT 50+ PO) Take 1 tablet by mouth daily.   naproxen sodium (ALEVE) 220 MG tablet Take 220 mg by mouth 2 (two) times daily with a meal.   Omega 3-6-9 Fatty Acids (OMEGA 3-6-9 COMPLEX PO) Take 1 capsule by mouth 2 (two) times daily.   SANTYL ointment Apply 1 application topically daily.   sitaGLIPtin-metformin (JANUMET) 50-1000 MG per tablet Take 1 tablet by mouth 2 (two) times daily with a meal.   No current facility-administered medications for this visit. (Other)     ALLERGIES Allergies  Allergen Reactions   Anesthetics, Amide Nausea And Vomiting   Codeine Nausea And Vomiting    PAST MEDICAL HISTORY Past Medical History:  Diagnosis Date   Arthritis    OSTEO   Diabetes mellitus without complication ([place])    TYPE 2   Family history of adverse reaction to anesthesia    Mother - N/V   Goiter    Hyperlipidemia    Hypertension    PONV (postoperative nausea and vomiting)    Retinopathy    Past Surgical History:  Procedure Laterality Date   ABDOMINAL AORTOGRAM W/LOWER EXTREMITY N/A 03/31/2020   Procedure: ABDOMINAL AORTOGRAM W/LOWER EXTREMITY;  Surgeon: Jamelle Haring  N, MD;  Location: [facility];  Service: Cardiovascular;  Laterality: N/A;   ABDOMINAL HYSTERECTOMY     AIR/FLUID EXCHANGE Left 11/30/2014   Procedure: AIR/FLUID EXCHANGE LEFT EYE;  Surgeon: Hurman Horn, MD;  Location: [facility];  Service: Ophthalmology;  Laterality: Left;   AMPUTATION Left 06/11/2020   Procedure: LEFT GREAT TOE AMPUTATION;  Surgeon: Newt Minion, MD;  Location: [facility];  Service: Orthopedics;  Laterality: Left;   BACK SURGERY     CESAREAN SECTION     x 2   EYE SURGERY     INJECTION OF SILICONE OIL Right 07/10/1446   Procedure: INJECTION OF SILICONE OIL;  Surgeon: Hurman Horn, MD;  Location: [facility];  Service: Ophthalmology;  Laterality: Right;    INJECTION OF SILICONE OIL Left 1/85/6314   Procedure: INJECTION OF SILICONE OIL;  Surgeon: Hurman Horn, MD;  Location: [facility];  Service: Ophthalmology;  Laterality: Left;   LASER PHOTO ABLATION Right 05/26/2014   Procedure: LASER PHOTO ABLATION;  Surgeon: Hurman Horn, MD;  Location: [facility];  Service: Ophthalmology;  Laterality: Right;   PARS PLANA VITRECTOMY Right 05/26/2014   Procedure: PARS PLANA VITRECTOMY 25 GAUGE;  Surgeon: Hurman Horn, MD;  Location: [facility];  Service: Ophthalmology;  Laterality: Right;   PARS PLANA VITRECTOMY Left 11/30/2014   Procedure: PARS PLANA VITRECTOMY WITH 25 GAUGE with endolaser;  Surgeon: Hurman Horn, MD;  Location: [facility];  Service: Ophthalmology;  Laterality: Left;   PERIPHERAL VASCULAR INTERVENTION Left 03/31/2020   Procedure: PERIPHERAL VASCULAR INTERVENTION;  Surgeon: Cherre Robins, MD;  Location: [facility];  Service: Cardiovascular;  Laterality: Left;  superficial femoral   TONSILLECTOMY     TOTAL HIP ARTHROPLASTY Right 04/21/2014   dr Percell Miller   TOTAL HIP ARTHROPLASTY Right 04/21/2014   Procedure: RIGHT TOTAL HIP ARTHROPLASTY ANTERIOR APPROACH;  Surgeon: Renette Butters, MD;  Location: [facility];  Service: Orthopedics;  Laterality: Right;    FAMILY HISTORY Family History  Problem Relation Age of Onset   Healthy Mother    Healthy Father     SOCIAL HISTORY Social History   Tobacco Use   Smoking status: Former    Pack years: 0.00   Smokeless tobacco: Never   Tobacco comments:    smoked as a teenager  Scientific laboratory 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 used  Substance Use Topics   Alcohol use: No   Drug use: No         OPHTHALMIC EXAM:  Base Eye Exam     Visual Acuity (ETDRS)       Right Left   Dist cc 20/200 CF at 3'         Tonometry (Tonopen, 2:01 PM)       Right Left   Pressure 29          Pupils       Pupils   Right PERRL   Left PERRL         Visual Fields       Left Right   Restrictions Partial outer superior  temporal, inferior temporal deficiencies Partial outer inferior temporal deficiency         Neuro/Psych     Oriented x3: Yes           Slit Lamp and Fundus Exam     External Exam       Right Left   External Normal Normal  Slit Lamp Exam       Right Left   Lids/Lashes Normal Normal   Conjunctiva/Sclera White and quiet White and quiet   Cornea Clear Clear   Anterior Chamber Deep and quiet Deep and quiet   Iris Round and reactive Round and reactive   Lens Centered posterior chamber intraocular lens Centered posterior chamber intraocular lens   Anterior Vitreous Normal, clear Normal         Fundus Exam       Right Left   Posterior Vitreous Clear silicone, 939% fill Clear silicone, 030% fill   Disc Normal Normal   C/D Ratio 0.65 0.65   Macula Good focal laser scars, attached, atrophy present centrally Good focal laser scars, attached, atrophy present centrally   Vessels Quiet PDR, good PRP peripherally Quiet PDR, good PRP peripherally   Periphery Attached, good PRP 360 Attached, good PRP 360            IMAGING AND PROCEDURES  Imaging and Procedures for @TODAY @           ASSESSMENT/PLAN:  No diagnosis found.  Ophthalmic Meds Ordered this visit:  Meds ordered this encounter  Medications   ofloxacin (OCUFLOX) 0.3 % ophthalmic solution    Sig: Place 1 drop into the right eye in the morning, at noon, in the evening, and at bedtime for 21 doses.    Dispense:  5 mL    Refill:  0   prednisoLONE acetate (PRED FORTE) 1 % ophthalmic suspension    Sig: Place 1 drop into the right eye 4 (four) times daily for 21 doses.    Dispense:  5 mL    Refill:  0        Pre-op completed. Operative consent obtained with pre-op eye drops reviewed with Tyler Pita and sent via [facility] as needed. Post op instructions reviewed with patient and per patient all questions answered.  [place], COA

## 2020-10-27 ENCOUNTER — Encounter (AMBULATORY_SURGERY_CENTER): Payer: Medicare Other | Admitting: Ophthalmology

## 2020-10-27 DIAGNOSIS — Z48810 Encounter for surgical aftercare following surgery on the sense organs: Secondary | ICD-10-CM | POA: Diagnosis not present

## 2020-10-27 DIAGNOSIS — E113591 Type 2 diabetes mellitus with proliferative diabetic retinopathy without macular edema, right eye: Secondary | ICD-10-CM

## 2020-10-28 ENCOUNTER — Encounter (INDEPENDENT_AMBULATORY_CARE_PROVIDER_SITE_OTHER): Payer: Self-pay | Admitting: Ophthalmology

## 2020-10-28 ENCOUNTER — Ambulatory Visit (INDEPENDENT_AMBULATORY_CARE_PROVIDER_SITE_OTHER): Payer: Medicare Other | Admitting: Ophthalmology

## 2020-10-28 ENCOUNTER — Other Ambulatory Visit: Payer: Self-pay

## 2020-10-28 DIAGNOSIS — E113551 Type 2 diabetes mellitus with stable proliferative diabetic retinopathy, right eye: Secondary | ICD-10-CM

## 2020-10-28 NOTE — Progress Notes (Signed)
10/28/2020     CHIEF COMPLAINT Patient presents for Post-op Follow-up (1 day post-op OD sx on 10/27/2020/Pt states, "I did well last night. My eye is scratchy but nothing terrible."/LBS: 148/)   HISTORY OF PRESENT ILLNESS: [name] is a 65 y.o. female who presents to the clinic today for:   HPI     Post-op Follow-up           Laterality: right eye   Discomfort: Negative for pain (Pt states that she has some scratchiness but no pain)   Vision: is stable   Comments: 1 day post-op OD sx on 10/27/2020 Pt states, "I did well last night. My eye is scratchy but nothing terrible." LBS: 148        Last edited by Kendra Opitz, COA on 10/28/2020  8:21 AM.      Referring physician: Celene Squibb, MD [place],  [place] [address]  HISTORICAL INFORMATION:   Selected notes from the MEDICAL RECORD NUMBER    Lab Results  Component Value Date   HGBA1C 7.2 (H) 06/03/2020     CURRENT MEDICATIONS: No current outpatient medications on file. (Ophthalmic Drugs)   No current facility-administered medications for this visit. (Ophthalmic Drugs)   Current Outpatient Medications (Other)  Medication Sig   acidophilus (RISAQUAD) CAPS capsule Take 1 capsule by mouth daily.   aspirin 81 MG EC tablet TAKE 1 TABLET (81 MG TOTAL) BY MOUTH DAILY. SWALLOW WHOLE.   aspirin EC 81 MG tablet Take 1 tablet (81 mg total) by mouth daily. Swallow whole.   atorvastatin (LIPITOR) 10 MG tablet Take 10 mg by mouth daily.   b complex vitamins tablet Take 1 tablet by mouth daily.   clopidogrel (PLAVIX) 75 MG tablet Take 1 tablet (75 mg total) by mouth daily.   doxycycline (VIBRA-TABS) 100 MG tablet Take 1 tablet (100 mg total) by mouth 2 (two) times daily.   glipiZIDE (GLUCOTROL) 10 MG tablet Take 10 mg by mouth 2 (two) times daily.   HYDROcodone-acetaminophen (NORCO/VICODIN) 5-325 MG tablet Take 1 tablet by mouth every 4 (four) hours as needed for moderate pain.    lisinopril-hydrochlorothiazide (ZESTORETIC) 20-12.5 MG tablet Take 2 tablets by mouth daily.   Multiple Vitamins-Minerals (MULTIVITAMIN PO) Take 1 tablet by mouth daily.   Multiple Vitamins-Minerals (OCUVITE ADULT 50+ PO) Take 1 tablet by mouth daily.   naproxen sodium (ALEVE) 220 MG tablet Take 220 mg by mouth 2 (two) times daily with a meal.   Omega 3-6-9 Fatty Acids (OMEGA 3-6-9 COMPLEX PO) Take 1 capsule by mouth 2 (two) times daily.   SANTYL ointment Apply 1 application topically daily.   sitaGLIPtin-metformin (JANUMET) 50-1000 MG per tablet Take 1 tablet by mouth 2 (two) times daily with a meal.   No current facility-administered medications for this visit. (Other)      REVIEW OF SYSTEMS:    ALLERGIES Allergies  Allergen Reactions   Anesthetics, Amide Nausea And Vomiting   Codeine Nausea And Vomiting    PAST MEDICAL HISTORY Past Medical History:  Diagnosis Date   Arthritis    OSTEO   Diabetes mellitus without complication ([place])    TYPE 2   Family history of adverse reaction to anesthesia    Mother - N/V   Goiter    Hyperlipidemia    Hypertension    PONV (postoperative nausea and vomiting)    Retinopathy    Past Surgical History:  Procedure Laterality Date   ABDOMINAL AORTOGRAM  W/LOWER EXTREMITY N/A 03/31/2020   Procedure: ABDOMINAL AORTOGRAM W/LOWER EXTREMITY;  Surgeon: Cherre Robins, MD;  Location: [facility];  Service: Cardiovascular;  Laterality: N/A;   ABDOMINAL HYSTERECTOMY     AIR/FLUID EXCHANGE Left 11/30/2014   Procedure: AIR/FLUID EXCHANGE LEFT EYE;  Surgeon: Hurman Horn, MD;  Location: [facility];  Service: Ophthalmology;  Laterality: Left;   AMPUTATION Left 06/11/2020   Procedure: LEFT GREAT TOE AMPUTATION;  Surgeon: Newt Minion, MD;  Location: [facility];  Service: Orthopedics;  Laterality: Left;   BACK SURGERY     CESAREAN SECTION     x 2   EYE SURGERY     INJECTION OF SILICONE OIL Right [id number]   Procedure: INJECTION OF SILICONE OIL;   Surgeon: Hurman Horn, MD;  Location: [facility];  Service: Ophthalmology;  Laterality: Right;   INJECTION OF SILICONE OIL Left [id number]   Procedure: INJECTION OF SILICONE OIL;  Surgeon: Hurman Horn, MD;  Location: [facility];  Service: Ophthalmology;  Laterality: Left;   LASER PHOTO ABLATION Right 05/26/2014   Procedure: LASER PHOTO ABLATION;  Surgeon: Hurman Horn, MD;  Location: [facility];  Service: Ophthalmology;  Laterality: Right;   PARS PLANA VITRECTOMY Right 05/26/2014   Procedure: PARS PLANA VITRECTOMY 25 GAUGE;  Surgeon: Hurman Horn, MD;  Location: [facility];  Service: Ophthalmology;  Laterality: Right;   PARS PLANA VITRECTOMY Left 11/30/2014   Procedure: PARS PLANA VITRECTOMY WITH 25 GAUGE with endolaser;  Surgeon: Hurman Horn, MD;  Location: [facility];  Service: Ophthalmology;  Laterality: Left;   PERIPHERAL VASCULAR INTERVENTION Left 03/31/2020   Procedure: PERIPHERAL VASCULAR INTERVENTION;  Surgeon: Cherre Robins, MD;  Location: [facility];  Service: Cardiovascular;  Laterality: Left;  superficial femoral   TONSILLECTOMY     TOTAL HIP ARTHROPLASTY Right 04/21/2014   dr Percell Miller   TOTAL HIP ARTHROPLASTY Right 04/21/2014   Procedure: RIGHT TOTAL HIP ARTHROPLASTY ANTERIOR APPROACH;  Surgeon: Renette Butters, MD;  Location: [facility];  Service: Orthopedics;  Laterality: Right;    FAMILY HISTORY Family History  Problem Relation Age of Onset   Healthy Mother    Healthy Father     SOCIAL HISTORY Social History   Tobacco Use   Smoking status: Former   Smokeless tobacco: Never   Tobacco comments:    smoked as a teenager  Scientific laboratory 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 used  Substance Use Topics   Alcohol use: No   Drug use: No         OPHTHALMIC EXAM:  Base Eye Exam     Visual Acuity (ETDRS)       Right Left   Dist [place] 20/200 -1 CF at 3'   Dist ph [place] NI          Tonometry (Tonopen, 8:27 AM)       Right Left   Pressure 22 20         Pupils       Dark Light Shape React APD    Right 6 6 Round Dilated    Left 3 2 Round Slow None         Extraocular Movement       Right Left    Full Full         Neuro/Psych     Oriented x3: Yes         Dilation     Right eye: 1.0% Mydriacyl, 2.5% Phenylephrine @ 8:27 AM  Slit Lamp and Fundus Exam     External Exam       Right Left   External Normal Normal         Slit Lamp Exam       Right Left   Lids/Lashes Normal Normal   Conjunctiva/Sclera White and quiet White and quiet   Cornea Clear Clear   Anterior Chamber Deep and quiet, no oral NAC Deep and quiet   Iris Round and reactive Round and reactive   Lens Centered posterior chamber intraocular lens Centered posterior chamber intraocular lens   Anterior Vitreous Normal, clear Normal         Fundus Exam       Right Left   Posterior Vitreous Clear, avitric, 10% gas    Disc 1+ Optic disc atrophy    C/D Ratio 0.65 0.65   Macula Good focal laser scars, attached, geographic atrophy present centrally    Vessels Quiet PDR, good PRP peripherally    Periphery Attached, good PRP 360             IMAGING AND PROCEDURES  Imaging and Procedures for 10/28/20           ASSESSMENT/PLAN:  Stable treated proliferative diabetic retinopathy of right eye determined by examination associated with type 2 diabetes mellitus ([place]) The nature of regressed proliferative diabetic retinopathy was discussed with the patient. The patient was advised to maintain good glucose, blood pressure, monitor kidney function and serum lipid control as advised by personal physician. Rare risk for reactivation of progression exist with untreated severe anemia, untreated renal failure, untreated heart failure, and smoking. Complete avoidance of smoking was recommended. The chance of recurrent proliferative diabetic retinopathy was discussed as well as the chance of vitreous hemorrhage for which further treatments may be necessary.   Explained to the patient that  the quiescent  proliferative diabetic retinopathy disease is unlikely to ever worsen.  Worsening factors would include however severe anemia, hypertension out-of-control or impending renal failure.  OD, history of vitrectomy and instillation of silicone oil some years previous, and long-term retained silicone oil, now 1 day post vitrectomy with removal of silicone oil with endolaser.  This injection was applied so as to prevent hypotony often seen after long-term oral installation  Anterior chamber washout was also carried out at time to remove emulsified silicone oil      0000000   1. Stable treated proliferative diabetic retinopathy of right eye determined by examination associated with type 2 diabetes mellitus ([place])  [id number]       1.  Postop day #1 status post vitrectomy, endolaser, removal of silicone oil retained as well as anterior chamber washout.  Intraocular pressure has improved nicely.  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  Small amount of intraocular gas was applied to prevent postoperative hypotony after long-term retained oil in place.  2.  Positioning reviewed with the patient limitations reviewed  3.  Patient instructed to resume topical medical drop therapy the right eye only  Ophthalmic Meds Ordered this visit:  No orders of the defined types were placed in this encounter.      Return in about 1 week (around 11/04/2020) for dilate, OD, OCT, COLOR FP, POST OP.  Patient Instructions  Ofloxacin  4 times daily to the operative eye  Prednisolone acetate 1 drop to the operative eye 4 times daily  Patient instructed not to refill the medications and use them for maximum of 3 weeks.  Patient instructed do  not rub the eye.  Patient has the option to use the patch at night.   The patient was found to be doing well, postoperatively, and was advised in the use of drops and home care.  Patient was advised not to rub eyes. Positioning was  described as well, as precautions regarding intravitreal gas, if applicable. DO NOT TRAVEL TO MOUNTAINS, OR IN AIRPLANE, UNTIL THE BUBBLE INSIDE THE EYE HAS DISAPPEARED.  The use of eye patch at night is optional and was discussed. May use paper tape with patch if patient has dry skin and transpore tape for oily skin.   Use topical medications as ordered. The patient was found to be doing well, postoperatively, and was advised in the use of drops and home care.  Patient was advised not to rub eyes. Positioning was described as well, as precautions regarding intravitreal gas, if applicable. DO NOT TRAVEL TO MOUNTAINS, OR IN AIRPLANE, UNTIL THE BUBBLE INSIDE THE EYE HAS DISAPPEARED.  The use of eye patch at night is optional and was discussed. May use paper tape with patch if patient has dry skin and transpore tape for oily skin.   Use topical medications as ordered.    Explained the diagnoses, plan, and follow up with the patient and they expressed understanding.  Patient expressed understanding of the importance of proper follow up care.   Clent Demark Dorthie Santini M.D. Diseases & Surgery of the Retina and Vitreous [facility] 10/28/20     Abbreviations: M myopia (nearsighted); A astigmatism; H hyperopia (farsighted); P presbyopia; Mrx spectacle prescription;  CTL contact lenses; OD right eye; OS left eye; OU both eyes  XT exotropia; ET esotropia; PEK punctate epithelial keratitis; PEE punctate epithelial erosions; DES dry eye syndrome; MGD meibomian gland dysfunction; ATs artificial tears; PFAT's preservative free artificial tears; [place] nuclear sclerotic cataract; PSC posterior subcapsular cataract; ERM epi-retinal membrane; PVD posterior vitreous detachment; RD retinal detachment; DM diabetes mellitus; DR diabetic retinopathy; NPDR non-proliferative diabetic retinopathy; PDR proliferative diabetic retinopathy; CSME clinically significant macular edema; DME diabetic macular edema; dbh dot blot  hemorrhages; CWS cotton wool spot; POAG primary open angle glaucoma; C/D cup-to-disc ratio; HVF humphrey visual field; GVF goldmann visual field; OCT optical coherence tomography; IOP intraocular pressure; BRVO Branch retinal vein occlusion; CRVO central retinal vein occlusion; CRAO central retinal artery occlusion; BRAO branch retinal artery occlusion; RT retinal tear; SB scleral buckle; PPV pars plana vitrectomy; VH Vitreous hemorrhage; PRP panretinal laser photocoagulation; IVK intravitreal kenalog; VMT vitreomacular traction; MH Macular hole;  NVD neovascularization of the disc; NVE neovascularization elsewhere; AREDS age related eye disease study; ARMD age related macular degeneration; POAG primary open angle glaucoma; EBMD epithelial/anterior basement membrane dystrophy; ACIOL anterior chamber intraocular lens; IOL intraocular lens; PCIOL posterior chamber intraocular lens; Phaco/IOL phacoemulsification with intraocular lens placement; [place] photorefractive keratectomy; LASIK laser assisted in situ keratomileusis; HTN hypertension; DM diabetes mellitus; COPD chronic obstructive pulmonary disease

## 2020-10-28 NOTE — Assessment & Plan Note (Signed)
The nature of regressed proliferative diabetic retinopathy was discussed with the patient. The patient was advised to maintain good glucose, blood pressure, monitor kidney function and serum lipid control as advised by personal physician. Rare risk for reactivation of progression exist with untreated severe anemia, untreated renal failure, untreated heart failure, and smoking. Complete avoidance of smoking was recommended. The chance of recurrent proliferative diabetic retinopathy was discussed as well as the chance of vitreous hemorrhage for which further treatments may be necessary.   Explained to the patient that the quiescent  proliferative diabetic retinopathy disease is unlikely to ever worsen.  Worsening factors would include however severe anemia, hypertension out-of-control or impending renal failure.  OD, history of vitrectomy and instillation of silicone oil some years previous, and long-term retained silicone oil, now 1 day post vitrectomy with removal of silicone oil with endolaser.  This injection was applied so as to prevent hypotony often seen after long-term oral installation  Anterior chamber washout was also carried out at time to remove emulsified silicone oil

## 2020-10-28 NOTE — Patient Instructions (Signed)
Ofloxacin  4 times daily to the operative eye  Prednisolone acetate 1 drop to the operative eye 4 times daily  Patient instructed not to refill the medications and use them for maximum of 3 weeks.  Patient instructed do not rub the eye.  Patient has the option to use the patch at night.   The patient was found to be doing well, postoperatively, and was advised in the use of drops and home care.  Patient was advised not to rub eyes. Positioning was described as well, as precautions regarding intravitreal gas, if applicable. DO NOT TRAVEL TO MOUNTAINS, OR IN AIRPLANE, UNTIL THE BUBBLE INSIDE THE EYE HAS DISAPPEARED.  The use of eye patch at night is optional and was discussed. May use paper tape with patch if patient has dry skin and transpore tape for oily skin.   Use topical medications as ordered. The patient was found to be doing well, postoperatively, and was advised in the use of drops and home care.  Patient was advised not to rub eyes. Positioning was described as well, as precautions regarding intravitreal gas, if applicable. DO NOT TRAVEL TO MOUNTAINS, OR IN AIRPLANE, UNTIL THE BUBBLE INSIDE THE EYE HAS DISAPPEARED.  The use of eye patch at night is optional and was discussed. May use paper tape with patch if patient has dry skin and transpore tape for oily skin.   Use topical medications as ordered.

## 2020-11-04 ENCOUNTER — Encounter (INDEPENDENT_AMBULATORY_CARE_PROVIDER_SITE_OTHER): Payer: Self-pay | Admitting: Ophthalmology

## 2020-11-04 ENCOUNTER — Other Ambulatory Visit: Payer: Self-pay

## 2020-11-04 ENCOUNTER — Ambulatory Visit (INDEPENDENT_AMBULATORY_CARE_PROVIDER_SITE_OTHER): Payer: Medicare Other | Admitting: Ophthalmology

## 2020-11-04 DIAGNOSIS — E113552 Type 2 diabetes mellitus with stable proliferative diabetic retinopathy, left eye: Secondary | ICD-10-CM | POA: Diagnosis not present

## 2020-11-04 DIAGNOSIS — E113551 Type 2 diabetes mellitus with stable proliferative diabetic retinopathy, right eye: Secondary | ICD-10-CM | POA: Diagnosis not present

## 2020-11-04 NOTE — Progress Notes (Signed)
11/04/2020     CHIEF COMPLAINT Patient presents for Post-op Follow-up (1 week post op OD and OCT/fp 10/27/2020/Pt states, "I think my vision is better. It seems to be. I am not having any issues."/Pt reports using Pred and Ofloxacin gtts QID OD/)   HISTORY OF PRESENT ILLNESS: [name] is a 65 y.o. female who presents to the clinic today for:   HPI     Post-op Follow-up           Laterality: right eye   Discomfort: Negative for pain, itching, foreign body sensation, discharge and floaters   Vision: is improved   Comments: 1 week post op OD and OCT/fp 10/27/2020 Pt states, "I think my vision is better. It seems to be. I am not having any issues." Pt reports using Pred and Ofloxacin gtts QID OD        Last edited by Kendra Opitz, COA on 11/04/2020 10:39 AM.      Referring physician: Celene Squibb, MD [address] [place],  [place] [address]  HISTORICAL INFORMATION:   Selected notes from the MEDICAL RECORD NUMBER    Lab Results  Component Value Date   HGBA1C 7.2 (H) 06/03/2020     CURRENT MEDICATIONS: No current outpatient medications on file. (Ophthalmic Drugs)   No current facility-administered medications for this visit. (Ophthalmic Drugs)   Current Outpatient Medications (Other)  Medication Sig   acidophilus (RISAQUAD) CAPS capsule Take 1 capsule by mouth daily.   aspirin 81 MG EC tablet TAKE 1 TABLET (81 MG TOTAL) BY MOUTH DAILY. SWALLOW WHOLE.   aspirin EC 81 MG tablet Take 1 tablet (81 mg total) by mouth daily. Swallow whole.   atorvastatin (LIPITOR) 10 MG tablet Take 10 mg by mouth daily.   b complex vitamins tablet Take 1 tablet by mouth daily.   clopidogrel (PLAVIX) 75 MG tablet Take 1 tablet (75 mg total) by mouth daily.   doxycycline (VIBRA-TABS) 100 MG tablet Take 1 tablet (100 mg total) by mouth 2 (two) times daily.   glipiZIDE (GLUCOTROL) 10 MG tablet Take 10 mg by mouth 2 (two) times daily.   HYDROcodone-acetaminophen (NORCO/VICODIN)  5-325 MG tablet Take 1 tablet by mouth every 4 (four) hours as needed for moderate pain.   lisinopril-hydrochlorothiazide (ZESTORETIC) 20-12.5 MG tablet Take 2 tablets by mouth daily.   Multiple Vitamins-Minerals (MULTIVITAMIN PO) Take 1 tablet by mouth daily.   Multiple Vitamins-Minerals (OCUVITE ADULT 50+ PO) Take 1 tablet by mouth daily.   naproxen sodium (ALEVE) 220 MG tablet Take 220 mg by mouth 2 (two) times daily with a meal.   Omega 3-6-9 Fatty Acids (OMEGA 3-6-9 COMPLEX PO) Take 1 capsule by mouth 2 (two) times daily.   SANTYL ointment Apply 1 application topically daily.   sitaGLIPtin-metformin (JANUMET) 50-1000 MG per tablet Take 1 tablet by mouth 2 (two) times daily with a meal.   No current facility-administered medications for this visit. (Other)      REVIEW OF SYSTEMS:    ALLERGIES Allergies  Allergen Reactions   Anesthetics, Amide Nausea And Vomiting   Codeine Nausea And Vomiting    PAST MEDICAL HISTORY Past Medical History:  Diagnosis Date   Arthritis    OSTEO   Diabetes mellitus without complication ([place])    TYPE 2   Family history of adverse reaction to anesthesia    Mother - N/V   Goiter    Hyperlipidemia    Hypertension    PONV (postoperative nausea  and vomiting)    Retinopathy    Past Surgical History:  Procedure Laterality Date   ABDOMINAL AORTOGRAM W/LOWER EXTREMITY N/A 03/31/2020   Procedure: ABDOMINAL AORTOGRAM W/LOWER EXTREMITY;  Surgeon: Cherre Robins, MD;  Location: [facility];  Service: Cardiovascular;  Laterality: N/A;   ABDOMINAL HYSTERECTOMY     AIR/FLUID EXCHANGE Left 11/30/2014   Procedure: AIR/FLUID EXCHANGE LEFT EYE;  Surgeon: Hurman Horn, MD;  Location: [facility];  Service: Ophthalmology;  Laterality: Left;   AMPUTATION Left 06/11/2020   Procedure: LEFT GREAT TOE AMPUTATION;  Surgeon: Newt Minion, MD;  Location: [facility];  Service: Orthopedics;  Laterality: Left;   BACK SURGERY     CESAREAN SECTION     x 2   EYE SURGERY      INJECTION OF SILICONE OIL Right 08/18/6158   Procedure: INJECTION OF SILICONE OIL;  Surgeon: Hurman Horn, MD;  Location: [facility];  Service: Ophthalmology;  Laterality: Right;   INJECTION OF SILICONE OIL Left 7/37/1062   Procedure: INJECTION OF SILICONE OIL;  Surgeon: Hurman Horn, MD;  Location: [facility] [facility];  Service: Ophthalmology;  Laterality: Left;   LASER PHOTO ABLATION Right 05/26/2014   Procedure: LASER PHOTO ABLATION;  Surgeon: Hurman Horn, MD;  Location: [facility];  Service: Ophthalmology;  Laterality: Right;   PARS PLANA VITRECTOMY Right 05/26/2014   Procedure: PARS PLANA VITRECTOMY 25 GAUGE;  Surgeon: Hurman Horn, MD;  Location: [facility];  Service: Ophthalmology;  Laterality: Right;   PARS PLANA VITRECTOMY Left 11/30/2014   Procedure: PARS PLANA VITRECTOMY WITH 25 GAUGE with endolaser;  Surgeon: Hurman Horn, MD;  Location: [facility];  Service: Ophthalmology;  Laterality: Left;   PERIPHERAL VASCULAR INTERVENTION Left 03/31/2020   Procedure: PERIPHERAL VASCULAR INTERVENTION;  Surgeon: Cherre Robins, MD;  Location: [facility];  Service: Cardiovascular;  Laterality: Left;  superficial femoral   TONSILLECTOMY     TOTAL HIP ARTHROPLASTY Right 04/21/2014   dr Percell Miller   TOTAL HIP ARTHROPLASTY Right 04/21/2014   Procedure: RIGHT TOTAL HIP ARTHROPLASTY ANTERIOR APPROACH;  Surgeon: Renette Butters, MD;  Location: [facility];  Service: Orthopedics;  Laterality: Right;    FAMILY HISTORY Family History  Problem Relation Age of Onset   Healthy Mother    Healthy Father     SOCIAL HISTORY Social History   Tobacco Use   Smoking status: Former   Smokeless tobacco: Never   Tobacco comments:    smoked as a teenager  Scientific laboratory 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 used  Substance Use Topics   Alcohol use: No   Drug use: No         OPHTHALMIC EXAM:  Base Eye Exam     Visual Acuity (ETDRS)       Right Left   Dist [place] 20/100 CF at 3'   Dist ph [place] 20/70 -2          Tonometry (Tonopen, 10:42 AM)        Right Left   Pressure 17 20         Pupils       Pupils Dark Light Shape React APD   Right PERRL 4 4 Round Minimal None   Left PERRL 3 3 Round Minimal None         Visual Fields       Left Right   Restrictions Partial outer superior temporal, inferior temporal deficiencies Partial outer inferior temporal deficiency  Neuro/Psych     Oriented x3: Yes         Dilation     Right eye: 1.0% Mydriacyl, 2.5% Phenylephrine @ 10:42 AM           Slit Lamp and Fundus Exam     External Exam       Right Left   External Normal Normal         Slit Lamp Exam       Right Left   Lids/Lashes Normal Normal   Conjunctiva/Sclera White and quiet White and quiet   Cornea Clear Clear   Anterior Chamber Deep and quiet, no oral NAC Deep and quiet   Iris Round and reactive Round and reactive   Lens Centered posterior chamber intraocular lens Centered posterior chamber intraocular lens   Anterior Vitreous Normal, clear Normal         Fundus Exam       Right Left   Posterior Vitreous Clear, avitric,     Disc 1+ Optic disc atrophy    C/D Ratio 0.65 0.65   Macula Good focal laser scars, attached, geographic atrophy present centrally    Vessels Quiet PDR, good PRP peripherally    Periphery Attached, good PRP 360             IMAGING AND PROCEDURES  Imaging and Procedures for 11/04/20  OCT, Retina - OU - Both Eyes       Right Eye Quality was good. Scan locations included subfoveal. Central Foveal Thickness: 207. Progression has been stable. Findings include abnormal foveal contour, outer retinal atrophy, inner retinal atrophy, central retinal atrophy.   Left Eye Quality was good. Scan locations included subfoveal. Central Foveal Thickness: 234. Progression has been stable. Findings include abnormal foveal contour, central retinal atrophy, inner retinal atrophy, outer retinal atrophy.   Notes OD, clear media post vitrectomy, silicone oil removal.   OS with clear silicone, with foveal atrophy from prior RPE degeneration.     Color Fundus Photography Optos - OU - Both Eyes       Right Eye Progression has been stable. Disc findings include increased cup to disc ratio, pallor. Macula : retinal pigment epithelium abnormalities.   Left Eye Progression has been stable. Disc findings include increased cup to disc ratio. Macula : retinal pigment epithelium abnormalities.   Notes Bilateral quiet PDR, clear media, 1 week post vitrectomy OD no new hemorrhage  OS with clear silicone oil requires vitrectomy and silicone oil removal in the near future               ASSESSMENT/PLAN:  Stable treated proliferative diabetic retinopathy of left eye determined by examination associated with type 2 diabetes mellitus ([place]) With retained silicone oil OS long-term, needs silicone removal via vitrectomy endolaser left eye under local MAC in the near future  Stable treated proliferative diabetic retinopathy of right eye determined by examination associated with type 2 diabetes mellitus ([place]) Excellent acuity has now improved from 20/200's in the right eye to now 20/70 of the pinhole will need final refraction in the coming weeks.     ICD-10-CM   1. Stable treated proliferative diabetic retinopathy of right eye determined by examination associated with type 2 diabetes mellitus ([place])  E11.3551 OCT, Retina - OU - Both Eyes    Color Fundus Photography Optos - OU - Both Eyes    2. Stable treated proliferative diabetic retinopathy of left eye determined by examination associated with type 2 diabetes mellitus ([place])  F41.4239  1.  OD, patient to complete topical medications, with no refills.  She should use the medications over the next 2 weeks or sooner if complete before then  2.  OS, retained silicone oil, patient would like to proceed with with vitrectomy and silicone oil removal left eye as soon as possible  3.  Ophthalmic Meds Ordered  this visit:  No orders of the defined types were placed in this encounter.      Return ,,, [facility], for Schedule vitrectomy removal silicone oil left eye, laser, [address], OS.  There are no Patient Instructions on file for this visit.   Explained the diagnoses, plan, and follow up with the patient and they expressed understanding.  Patient expressed understanding of the importance of proper follow up care.   Clent Demark Rebecca Cairns M.D. Diseases & Surgery of the Retina and Vitreous [facility]ina [facility] Diabetic [place] 11/04/20     Abbreviations: M myopia (nearsighted); A astigmatism; H hyperopia (farsighted); P presbyopia; Mrx spectacle prescription;  CTL contact lenses; OD right eye; OS left eye; OU both eyes  XT exotropia; ET esotropia; PEK punctate epithelial keratitis; PEE punctate epithelial erosions; DES dry eye syndrome; MGD meibomian gland dysfunction; ATs artificial tears; PFAT's preservative free artificial tears; [place] nuclear sclerotic cataract; PSC posterior subcapsular cataract; ERM epi-retinal membrane; PVD posterior vitreous detachment; RD retinal detachment; DM diabetes mellitus; DR diabetic retinopathy; NPDR non-proliferative diabetic retinopathy; PDR proliferative diabetic retinopathy; CSME clinically significant macular edema; DME diabetic macular edema; dbh dot blot hemorrhages; CWS cotton wool spot; POAG primary open angle glaucoma; C/D cup-to-disc ratio; HVF humphrey visual field; GVF goldmann visual field; OCT optical coherence tomography; IOP intraocular pressure; BRVO Branch retinal vein occlusion; CRVO central retinal vein occlusion; CRAO central retinal artery occlusion; BRAO branch retinal artery occlusion; RT retinal tear; SB scleral buckle; PPV pars plana vitrectomy; VH Vitreous hemorrhage; PRP panretinal laser photocoagulation; IVK intravitreal kenalog; VMT vitreomacular traction; MH Macular hole;  NVD neovascularization of the disc; NVE neovascularization elsewhere;  AREDS age related eye disease study; ARMD age related macular degeneration; POAG primary open angle glaucoma; EBMD epithelial/anterior basement membrane dystrophy; ACIOL anterior chamber intraocular lens; IOL intraocular lens; PCIOL posterior chamber intraocular lens; Phaco/IOL phacoemulsification with intraocular lens placement; [place] photorefractive keratectomy; LASIK laser assisted in situ keratomileusis; HTN hypertension; DM diabetes mellitus; COPD chronic obstructive pulmonary disease

## 2020-11-04 NOTE — Assessment & Plan Note (Signed)
With retained silicone oil OS long-term, needs silicone removal via vitrectomy endolaser left eye under local MAC in the near future

## 2020-11-04 NOTE — Assessment & Plan Note (Signed)
Excellent acuity has now improved from 20/200's in the right eye to now 20/70 of the pinhole will need final refraction in the coming weeks.

## 2020-12-01 ENCOUNTER — Ambulatory Visit (INDEPENDENT_AMBULATORY_CARE_PROVIDER_SITE_OTHER): Payer: Medicare Other

## 2020-12-01 ENCOUNTER — Other Ambulatory Visit: Payer: Self-pay

## 2020-12-01 ENCOUNTER — Encounter (INDEPENDENT_AMBULATORY_CARE_PROVIDER_SITE_OTHER): Payer: Self-pay

## 2020-12-01 DIAGNOSIS — E113552 Type 2 diabetes mellitus with stable proliferative diabetic retinopathy, left eye: Secondary | ICD-10-CM

## 2020-12-01 MED ORDER — OFLOXACIN 0.3 % OP SOLN: 1.0000 [drp] | Freq: Four times a day (QID) | OPHTHALMIC | 0 refills | Status: AC

## 2020-12-01 MED ORDER — PREDNISOLONE ACETATE 1 % OP SUSP: 1.0000 [drp] | Freq: Four times a day (QID) | OPHTHALMIC | 0 refills | Status: AC

## 2020-12-01 NOTE — Progress Notes (Signed)
12/01/2020     CHIEF COMPLAINT Patient presents for Pre-op Exam   HISTORY OF PRESENT ILLNESS: [name] is a 65 y.o. female who presents to the clinic today for:   HPI   Patient is here for pre op OS. On 12/08/2020 patient will have PPV/endolaser/removal of silicone oil. Patient states vision is stable and unchanged since last visit. Denies any new floaters or FOL. LBS: 165  Last edited by Laurin Coder, COA on 12/01/2020  1:26 PM.        HISTORICAL INFORMATION:   Selected notes from the MEDICAL RECORD NUMBER    Lab Results  Component Value Date   HGBA1C 7.2 (H) 06/03/2020     CURRENT MEDICATIONS: No current outpatient medications on file. (Ophthalmic Drugs)   No current facility-administered medications for this visit. (Ophthalmic Drugs)   Current Outpatient Medications (Other)  Medication Sig   acidophilus (RISAQUAD) CAPS capsule Take 1 capsule by mouth daily.   aspirin 81 MG EC tablet TAKE 1 TABLET (81 MG TOTAL) BY MOUTH DAILY. SWALLOW WHOLE.   aspirin EC 81 MG tablet Take 1 tablet (81 mg total) by mouth daily. Swallow whole.   atorvastatin (LIPITOR) 10 MG tablet Take 10 mg by mouth daily.   b complex vitamins tablet Take 1 tablet by mouth daily.   clopidogrel (PLAVIX) 75 MG tablet Take 1 tablet (75 mg total) by mouth daily.   doxycycline (VIBRA-TABS) 100 MG tablet Take 1 tablet (100 mg total) by mouth 2 (two) times daily.   glipiZIDE (GLUCOTROL) 10 MG tablet Take 10 mg by mouth 2 (two) times daily.   HYDROcodone-acetaminophen (NORCO/VICODIN) 5-325 MG tablet Take 1 tablet by mouth every 4 (four) hours as needed for moderate pain.   lisinopril-hydrochlorothiazide (ZESTORETIC) 20-12.5 MG tablet Take 2 tablets by mouth daily.   Multiple Vitamins-Minerals (MULTIVITAMIN PO) Take 1 tablet by mouth daily.   Multiple Vitamins-Minerals (OCUVITE ADULT 50+ PO) Take 1 tablet by mouth daily.   naproxen sodium (ALEVE) 220 MG tablet Take 220 mg by mouth 2 (two) times  daily with a meal.   Omega 3-6-9 Fatty Acids (OMEGA 3-6-9 COMPLEX PO) Take 1 capsule by mouth 2 (two) times daily.   SANTYL ointment Apply 1 application topically daily.   sitaGLIPtin-metformin (JANUMET) 50-1000 MG per tablet Take 1 tablet by mouth 2 (two) times daily with a meal.   No current facility-administered medications for this visit. (Other)     ALLERGIES Allergies  Allergen Reactions   Anesthetics, Amide Nausea And Vomiting   Codeine Nausea And Vomiting    PAST MEDICAL HISTORY Past Medical History:  Diagnosis Date   Arthritis    OSTEO   Diabetes mellitus without complication ([place])    TYPE 2   Family history of adverse reaction to anesthesia    Mother - N/V   Goiter    Hyperlipidemia    Hypertension    PONV (postoperative nausea and vomiting)    Retinopathy    Past Surgical History:  Procedure Laterality Date   ABDOMINAL AORTOGRAM W/LOWER EXTREMITY N/A 03/31/2020   Procedure: ABDOMINAL AORTOGRAM W/LOWER EXTREMITY;  Surgeon: Cherre Robins, MD;  Location: [facility];  Service: Cardiovascular;  Laterality: N/A;   ABDOMINAL HYSTERECTOMY     AIR/FLUID EXCHANGE Left 11/30/2014   Procedure: AIR/FLUID EXCHANGE LEFT EYE;  Surgeon: Hurman Horn, MD;  Location: [facility];  Service: Ophthalmology;  Laterality: Left;   AMPUTATION Left 06/11/2020   Procedure: LEFT GREAT TOE AMPUTATION;  Surgeon:  Newt Minion, MD;  Location: [facility];  Service: Orthopedics;  Laterality: Left;   BACK SURGERY     CESAREAN SECTION     x 2   EYE SURGERY     INJECTION OF SILICONE OIL Right [id number]   Procedure: INJECTION OF SILICONE OIL;  Surgeon: Hurman Horn, MD;  Location: [facility];  Service: Ophthalmology;  Laterality: Right;   INJECTION OF SILICONE OIL Left [id number]   Procedure: INJECTION OF SILICONE OIL;  Surgeon: Hurman Horn, MD;  Location: [facility];  Service: Ophthalmology;  Laterality: Left;   LASER PHOTO ABLATION Right 05/26/2014   Procedure: LASER PHOTO ABLATION;  Surgeon: Hurman Horn, MD;  Location: [facility];  Service: Ophthalmology;  Laterality: Right;   PARS PLANA VITRECTOMY Right 05/26/2014   Procedure: PARS PLANA VITRECTOMY 25 GAUGE;  Surgeon: Hurman Horn, MD;  Location: [facility];  Service: Ophthalmology;  Laterality: Right;   PARS PLANA VITRECTOMY Left 11/30/2014   Procedure: PARS PLANA VITRECTOMY WITH 25 GAUGE with endolaser;  Surgeon: Hurman Horn, MD;  Location: [facility];  Service: Ophthalmology;  Laterality: Left;   PERIPHERAL VASCULAR INTERVENTION Left 03/31/2020   Procedure: PERIPHERAL VASCULAR INTERVENTION;  Surgeon: Cherre Robins, MD;  Location: [facility];  Service: Cardiovascular;  Laterality: Left;  superficial femoral   TONSILLECTOMY     TOTAL HIP ARTHROPLASTY Right 04/21/2014   dr Percell Miller   TOTAL HIP ARTHROPLASTY Right 04/21/2014   Procedure: RIGHT TOTAL HIP ARTHROPLASTY ANTERIOR APPROACH;  Surgeon: Renette Butters, MD;  Location: [facility];  Service: Orthopedics;  Laterality: Right;    FAMILY HISTORY Family History  Problem Relation Age of Onset   Healthy Mother    Healthy Father     SOCIAL HISTORY Social History   Tobacco Use   Smoking status: Former   Smokeless tobacco: Never   Tobacco comments:    smoked as a teenager  Scientific laboratory 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 used  Substance Use Topics   Alcohol use: No   Drug use: No         OPHTHALMIC EXAM:  Base Eye Exam     Visual Acuity (ETDRS)       Right Left   Dist [place] 20/100 -1 CF at 3'   Dist ph [place] NI          Tonometry (Tonopen, 1:30 PM)       Right Left   Pressure  19         Pupils       Pupils Dark Light Shape React APD   Right PERRL 4 4 Round Brisk None   Left PERRL 3 3 Round Brisk None            IMAGING AND PROCEDURES  Imaging and Procedures for '@TODAY'$ @           ASSESSMENT/PLAN:  No diagnosis found.  Ophthalmic Meds Ordered this visit:  No orders of the defined types were placed in this encounter.       Pre-op completed. Operative consent  obtained with pre-op eye drops reviewed with Tyler Pita and sent via [facility] as needed. Post op instructions reviewed with patient and per patient all questions answered.  [place], COA

## 2020-12-08 ENCOUNTER — Encounter (AMBULATORY_SURGERY_CENTER): Payer: Medicare Other | Admitting: Ophthalmology

## 2020-12-08 DIAGNOSIS — T85398A Other mechanical complication of other ocular prosthetic devices, implants and grafts, initial encounter: Secondary | ICD-10-CM

## 2020-12-08 DIAGNOSIS — Z48810 Encounter for surgical aftercare following surgery on the sense organs: Secondary | ICD-10-CM | POA: Diagnosis not present

## 2020-12-08 DIAGNOSIS — Z8669 Personal history of other diseases of the nervous system and sense organs: Secondary | ICD-10-CM | POA: Diagnosis not present

## 2020-12-08 DIAGNOSIS — E113592 Type 2 diabetes mellitus with proliferative diabetic retinopathy without macular edema, left eye: Secondary | ICD-10-CM | POA: Diagnosis not present

## 2020-12-09 ENCOUNTER — Other Ambulatory Visit: Payer: Self-pay

## 2020-12-09 ENCOUNTER — Ambulatory Visit (INDEPENDENT_AMBULATORY_CARE_PROVIDER_SITE_OTHER): Payer: Medicare Other | Admitting: Ophthalmology

## 2020-12-09 ENCOUNTER — Encounter (INDEPENDENT_AMBULATORY_CARE_PROVIDER_SITE_OTHER): Payer: Self-pay | Admitting: Ophthalmology

## 2020-12-09 DIAGNOSIS — E113552 Type 2 diabetes mellitus with stable proliferative diabetic retinopathy, left eye: Secondary | ICD-10-CM

## 2020-12-09 NOTE — Assessment & Plan Note (Signed)
Stable disease OS,  Complex recurrent vitreous hemorrhages in past with tractional detachment, stabilized by vitrectomy silicone oil implant some years previous.  Now 1 day status post oil removal left eye

## 2020-12-09 NOTE — Patient Instructions (Signed)
Ofloxacin  4 times daily to the operative eye  Prednisolone acetate 1 drop to the operative eye 4 times daily  Patient instructed not to refill the medications and use them for maximum of 3 weeks.  Patient instructed do not rub the eye.  Patient has the option to use the patch at night. 

## 2020-12-09 NOTE — Progress Notes (Signed)
12/09/2020     CHIEF COMPLAINT Patient presents for  Chief Complaint  Patient presents with   Post-op Follow-up      HISTORY OF PRESENT ILLNESS: [name] is a 65 y.o. female who presents to the clinic today for:   HPI     Post-op Follow-up   In left eye.        Comments   Postop day #1 status post vitrectomy, removal of posterior implant, silicone oil left eye.      Last edited by Hurman Horn, MD on 12/09/2020  8:22 AM.      Referring physician: Celene Squibb, MD [place],  [place] [address]  HISTORICAL INFORMATION:   Selected notes from the MEDICAL RECORD NUMBER    Lab Results  Component Value Date   HGBA1C 7.2 (H) 06/03/2020     CURRENT MEDICATIONS: No current outpatient medications on file. (Ophthalmic Drugs)   No current facility-administered medications for this visit. (Ophthalmic Drugs)   Current Outpatient Medications (Other)  Medication Sig   acidophilus (RISAQUAD) CAPS capsule Take 1 capsule by mouth daily.   aspirin 81 MG EC tablet TAKE 1 TABLET (81 MG TOTAL) BY MOUTH DAILY. SWALLOW WHOLE.   aspirin EC 81 MG tablet Take 1 tablet (81 mg total) by mouth daily. Swallow whole.   atorvastatin (LIPITOR) 10 MG tablet Take 10 mg by mouth daily.   b complex vitamins tablet Take 1 tablet by mouth daily.   clopidogrel (PLAVIX) 75 MG tablet Take 1 tablet (75 mg total) by mouth daily.   doxycycline (VIBRA-TABS) 100 MG tablet Take 1 tablet (100 mg total) by mouth 2 (two) times daily.   glipiZIDE (GLUCOTROL) 10 MG tablet Take 10 mg by mouth 2 (two) times daily.   HYDROcodone-acetaminophen (NORCO/VICODIN) 5-325 MG tablet Take 1 tablet by mouth every 4 (four) hours as needed for moderate pain.   lisinopril-hydrochlorothiazide (ZESTORETIC) 20-12.5 MG tablet Take 2 tablets by mouth daily.   Multiple Vitamins-Minerals (MULTIVITAMIN PO) Take 1 tablet by mouth daily.   Multiple Vitamins-Minerals (OCUVITE ADULT 50+ PO) Take 1 tablet by mouth  daily.   naproxen sodium (ALEVE) 220 MG tablet Take 220 mg by mouth 2 (two) times daily with a meal.   Omega 3-6-9 Fatty Acids (OMEGA 3-6-9 COMPLEX PO) Take 1 capsule by mouth 2 (two) times daily.   SANTYL ointment Apply 1 application topically daily.   sitaGLIPtin-metformin (JANUMET) 50-1000 MG per tablet Take 1 tablet by mouth 2 (two) times daily with a meal.   No current facility-administered medications for this visit. (Other)      REVIEW OF SYSTEMS:    ALLERGIES Allergies  Allergen Reactions   Anesthetics, Amide Nausea And Vomiting   Codeine Nausea And Vomiting    PAST MEDICAL HISTORY Past Medical History:  Diagnosis Date   Arthritis    OSTEO   Diabetes mellitus without complication ([place])    TYPE 2   Family history of adverse reaction to anesthesia    Mother - N/V   Goiter    Hyperlipidemia    Hypertension    PONV (postoperative nausea and vomiting)    Retinopathy    Past Surgical History:  Procedure Laterality Date   ABDOMINAL AORTOGRAM W/LOWER EXTREMITY N/A 03/31/2020   Procedure: ABDOMINAL AORTOGRAM W/LOWER EXTREMITY;  Surgeon: Cherre Robins, MD;  Location: [facility];  Service: Cardiovascular;  Laterality: N/A;   ABDOMINAL HYSTERECTOMY     AIR/FLUID EXCHANGE Left 11/30/2014  Procedure: AIR/FLUID EXCHANGE LEFT EYE;  Surgeon: Hurman Horn, MD;  Location: [facility];  Service: Ophthalmology;  Laterality: Left;   AMPUTATION Left 06/11/2020   Procedure: LEFT GREAT TOE AMPUTATION;  Surgeon: Newt Minion, MD;  Location: [facility];  Service: Orthopedics;  Laterality: Left;   BACK SURGERY     CESAREAN SECTION     x 2   EYE SURGERY     INJECTION OF SILICONE OIL Right [id number]   Procedure: INJECTION OF SILICONE OIL;  Surgeon: Hurman Horn, MD;  Location: [facility];  Service: Ophthalmology;  Laterality: Right;   INJECTION OF SILICONE OIL Left [id number]   Procedure: INJECTION OF SILICONE OIL;  Surgeon: Hurman Horn, MD;  Location: [facility];  Service: Ophthalmology;   Laterality: Left;   LASER PHOTO ABLATION Right 05/26/2014   Procedure: LASER PHOTO ABLATION;  Surgeon: Hurman Horn, MD;  Location: [facility];  Service: Ophthalmology;  Laterality: Right;   PARS PLANA VITRECTOMY Right 05/26/2014   Procedure: PARS PLANA VITRECTOMY 25 GAUGE;  Surgeon: Hurman Horn, MD;  Location: [facility];  Service: Ophthalmology;  Laterality: Right;   PARS PLANA VITRECTOMY Left 11/30/2014   Procedure: PARS PLANA VITRECTOMY WITH 25 GAUGE with endolaser;  Surgeon: Hurman Horn, MD;  Location: [facility];  Service: Ophthalmology;  Laterality: Left;   PERIPHERAL VASCULAR INTERVENTION Left 03/31/2020   Procedure: PERIPHERAL VASCULAR INTERVENTION;  Surgeon: Cherre Robins, MD;  Location: [facility];  Service: Cardiovascular;  Laterality: Left;  superficial femoral   TONSILLECTOMY     TOTAL HIP ARTHROPLASTY Right 04/21/2014   dr Percell Miller   TOTAL HIP ARTHROPLASTY Right 04/21/2014   Procedure: RIGHT TOTAL HIP ARTHROPLASTY ANTERIOR APPROACH;  Surgeon: Renette Butters, MD;  Location: [facility];  Service: Orthopedics;  Laterality: Right;    FAMILY HISTORY Family History  Problem Relation Age of Onset   Healthy Mother    Healthy Father     SOCIAL HISTORY Social History   Tobacco Use   Smoking status: Former   Smokeless tobacco: Never   Tobacco comments:    smoked as a teenager  Scientific laboratory 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 used  Substance Use Topics   Alcohol use: No   Drug use: No         OPHTHALMIC EXAM:  Slit Lamp and Fundus Exam     External Exam       Right Left   External Normal Normal         Slit Lamp Exam       Right Left   Lids/Lashes Normal Normal   Conjunctiva/Sclera White and quiet 2+ Injection   Cornea Clear Clear   Anterior Chamber Deep and quiet Trace Cell   Iris Round and reactive Pharmacologically dilated   Lens Centered posterior chamber intraocular lens Centered posterior chamber intraocular lens   Anterior Vitreous Normal, clear 1+ cell          Fundus Exam       Right Left   Posterior Vitreous  Trace haze   Disc  Normal   C/D Ratio  0.65   Macula  Good focal laser scars, attached, atrophy present centrally   Vessels  Quiet PDR, good PRP peripherally   Periphery  Attached, good PRP 360            IMAGING AND PROCEDURES  Imaging and Procedures for 12/09/20           ASSESSMENT/PLAN:  Stable treated proliferative diabetic  retinopathy of left eye determined by examination associated with type 2 diabetes mellitus ([place]) Stable disease OS,  Complex recurrent vitreous hemorrhages in past with tractional detachment, stabilized by vitrectomy silicone oil implant some years previous.  Now 1 day status post oil removal left eye     ICD-10-CM   1. Stable treated proliferative diabetic retinopathy of left eye determined by examination associated with type 2 diabetes mellitus ([place])  [id number]       1.  OS looks great, patient instructed to restart her topical medical drops left eye prednisolone acetate and Ocuflox  2.  3.  Ophthalmic Meds Ordered this visit:  No orders of the defined types were placed in this encounter.      Return in about 1 week (around 12/16/2020) for dilate, OS, POST OP, COLOR FP.  There are no Patient Instructions on file for this visit.   Explained the diagnoses, plan, and follow up with the patient and they expressed understanding.  Patient expressed understanding of the importance of proper follow up care.   Clent Demark Melek Pownall M.D. Diseases & Surgery of the Retina and Vitreous Retina & Diabetic [place] 12/09/20     Abbreviations: M myopia (nearsighted); A astigmatism; H hyperopia (farsighted); P presbyopia; Mrx spectacle prescription;  CTL contact lenses; OD right eye; OS left eye; OU both eyes  XT exotropia; ET esotropia; PEK punctate epithelial keratitis; PEE punctate epithelial erosions; DES dry eye syndrome; MGD meibomian gland dysfunction; ATs artificial tears; PFAT's  preservative free artificial tears; [place] nuclear sclerotic cataract; PSC posterior subcapsular cataract; ERM epi-retinal membrane; PVD posterior vitreous detachment; RD retinal detachment; DM diabetes mellitus; DR diabetic retinopathy; NPDR non-proliferative diabetic retinopathy; PDR proliferative diabetic retinopathy; CSME clinically significant macular edema; DME diabetic macular edema; dbh dot blot hemorrhages; CWS cotton wool spot; POAG primary open angle glaucoma; C/D cup-to-disc ratio; HVF humphrey visual field; GVF goldmann visual field; OCT optical coherence tomography; IOP intraocular pressure; BRVO Branch retinal vein occlusion; CRVO central retinal vein occlusion; CRAO central retinal artery occlusion; BRAO branch retinal artery occlusion; RT retinal tear; SB scleral buckle; PPV pars plana vitrectomy; VH Vitreous hemorrhage; PRP panretinal laser photocoagulation; IVK intravitreal kenalog; VMT vitreomacular traction; MH Macular hole;  NVD neovascularization of the disc; NVE neovascularization elsewhere; AREDS age related eye disease study; ARMD age related macular degeneration; POAG primary open angle glaucoma; EBMD epithelial/anterior basement membrane dystrophy; ACIOL anterior chamber intraocular lens; IOL intraocular lens; PCIOL posterior chamber intraocular lens; Phaco/IOL phacoemulsification with intraocular lens placement; [place] photorefractive keratectomy; LASIK laser assisted in situ keratomileusis; HTN hypertension; DM diabetes mellitus; COPD chronic obstructive pulmonary disease

## 2020-12-15 ENCOUNTER — Other Ambulatory Visit: Payer: Self-pay

## 2020-12-15 ENCOUNTER — Encounter (INDEPENDENT_AMBULATORY_CARE_PROVIDER_SITE_OTHER): Payer: Self-pay | Admitting: Ophthalmology

## 2020-12-15 ENCOUNTER — Ambulatory Visit (INDEPENDENT_AMBULATORY_CARE_PROVIDER_SITE_OTHER): Payer: Medicare Other | Admitting: Ophthalmology

## 2020-12-15 DIAGNOSIS — E113552 Type 2 diabetes mellitus with stable proliferative diabetic retinopathy, left eye: Secondary | ICD-10-CM

## 2020-12-15 NOTE — Assessment & Plan Note (Signed)
1 week post vitrectomy removal of silicone oil looks great, clear media.  Intraocular pressures normal.  Quiescent PDR unchanged.  Left vision limited by foveal atrophy and RPE degeneration OU.  We will have patient complete topical medications over the next 2 weeks

## 2020-12-15 NOTE — Patient Instructions (Signed)
Ofloxacin  4 times daily to the operative eye  Prednisolone acetate 1 drop to the operative eye 4 times daily  Patient instructed not to refill the medications and use them for maximum of 3 weeks.  Patient instructed do not rub the eye.  Patient has the option to use the patch at night.   Patient to discard remainder medications should she have any at the end of 2 weeks

## 2020-12-15 NOTE — Progress Notes (Signed)
12/15/2020     CHIEF COMPLAINT Patient presents for  Chief Complaint  Patient presents with   Post-op Follow-up   Retina Follow Up    Patient reports visual acuity seems to be slightly improving  HISTORY OF PRESENT ILLNESS: [name] is a 65 y.o. female who presents to the clinic today for:   HPI     Post-op Follow-up   In left eye.  Discomfort includes itching (Pt states her sx eye itches, states "both eyes itching so I don't know if it is allergies.").  Negative for pain and tearing.  Vision is improved.        Retina Follow Up   Patient presents with  Other.  In left eye.  This started 1 week ago.  Duration of 1 week.  Since onset it is gradually improving.        Comments   1 week po os fp sx 12/08/20. Pt states "It is pretty much clear, I could start seeing out of it by Saturday. I put drops in this morning and those make it a little blurred." Pt is using prednisolone and ofloxacin both qid OS.  Denies new FOL, denies new floaters. "I see specs of blood floating around but nothing major, but those specs have cleared up."       Last edited by Laurin Coder on 12/15/2020  8:11 AM.      Referring physician: Celene Squibb, MD [place],  [place] [address]  HISTORICAL INFORMATION:   Selected notes from the MEDICAL RECORD NUMBER    Lab Results  Component Value Date   HGBA1C 7.2 (H) 06/03/2020     CURRENT MEDICATIONS: No current outpatient medications on file. (Ophthalmic Drugs)   No current facility-administered medications for this visit. (Ophthalmic Drugs)   Current Outpatient Medications (Other)  Medication Sig   acidophilus (RISAQUAD) CAPS capsule Take 1 capsule by mouth daily.   aspirin 81 MG EC tablet TAKE 1 TABLET (81 MG TOTAL) BY MOUTH DAILY. SWALLOW WHOLE.   aspirin EC 81 MG tablet Take 1 tablet (81 mg total) by mouth daily. Swallow whole.   atorvastatin (LIPITOR) 10 MG tablet Take 10 mg by mouth daily.   b complex  vitamins tablet Take 1 tablet by mouth daily.   clopidogrel (PLAVIX) 75 MG tablet Take 1 tablet (75 mg total) by mouth daily.   doxycycline (VIBRA-TABS) 100 MG tablet Take 1 tablet (100 mg total) by mouth 2 (two) times daily.   glipiZIDE (GLUCOTROL) 10 MG tablet Take 10 mg by mouth 2 (two) times daily.   HYDROcodone-acetaminophen (NORCO/VICODIN) 5-325 MG tablet Take 1 tablet by mouth every 4 (four) hours as needed for moderate pain.   lisinopril-hydrochlorothiazide (ZESTORETIC) 20-12.5 MG tablet Take 2 tablets by mouth daily.   Multiple Vitamins-Minerals (MULTIVITAMIN PO) Take 1 tablet by mouth daily.   Multiple Vitamins-Minerals (OCUVITE ADULT 50+ PO) Take 1 tablet by mouth daily.   naproxen sodium (ALEVE) 220 MG tablet Take 220 mg by mouth 2 (two) times daily with a meal.   Omega 3-6-9 Fatty Acids (OMEGA 3-6-9 COMPLEX PO) Take 1 capsule by mouth 2 (two) times daily.   SANTYL ointment Apply 1 application topically daily.   sitaGLIPtin-metformin (JANUMET) 50-1000 MG per tablet Take 1 tablet by mouth 2 (two) times daily with a meal.   No current facility-administered medications for this visit. (Other)      REVIEW OF SYSTEMS:    ALLERGIES Allergies  Allergen Reactions  Anesthetics, Amide Nausea And Vomiting   Codeine Nausea And Vomiting    PAST MEDICAL HISTORY Past Medical History:  Diagnosis Date   Arthritis    OSTEO   Diabetes mellitus without complication ([place])    TYPE 2   Family history of adverse reaction to anesthesia    Mother - N/V   Goiter    Hyperlipidemia    Hypertension    PONV (postoperative nausea and vomiting)    Retinopathy    Past Surgical History:  Procedure Laterality Date   ABDOMINAL AORTOGRAM W/LOWER EXTREMITY N/A 03/31/2020   Procedure: ABDOMINAL AORTOGRAM W/LOWER EXTREMITY;  Surgeon: Cherre Robins, MD;  Location: [facility];  Service: Cardiovascular;  Laterality: N/A;   ABDOMINAL HYSTERECTOMY     AIR/FLUID EXCHANGE Left 11/30/2014    Procedure: AIR/FLUID EXCHANGE LEFT EYE;  Surgeon: Hurman Horn, MD;  Location: [facility];  Service: Ophthalmology;  Laterality: Left;   AMPUTATION Left 06/11/2020   Procedure: LEFT GREAT TOE AMPUTATION;  Surgeon: Newt Minion, MD;  Location: [facility];  Service: Orthopedics;  Laterality: Left;   BACK SURGERY     CESAREAN SECTION     x 2   EYE SURGERY     INJECTION OF SILICONE OIL Right [id number]   Procedure: INJECTION OF SILICONE OIL;  Surgeon: Hurman Horn, MD;  Location: [facility];  Service: Ophthalmology;  Laterality: Right;   INJECTION OF SILICONE OIL Left [id number]   Procedure: INJECTION OF SILICONE OIL;  Surgeon: Hurman Horn, MD;  Location: [facility];  Service: Ophthalmology;  Laterality: Left;   LASER PHOTO ABLATION Right 05/26/2014   Procedure: LASER PHOTO ABLATION;  Surgeon: Hurman Horn, MD;  Location: [facility];  Service: Ophthalmology;  Laterality: Right;   PARS PLANA VITRECTOMY Right 05/26/2014   Procedure: PARS PLANA VITRECTOMY 25 GAUGE;  Surgeon: Hurman Horn, MD;  Location: [facility];  Service: Ophthalmology;  Laterality: Right;   PARS PLANA VITRECTOMY Left 11/30/2014   Procedure: PARS PLANA VITRECTOMY WITH 25 GAUGE with endolaser;  Surgeon: Hurman Horn, MD;  Location: [facility];  Service: Ophthalmology;  Laterality: Left;   PERIPHERAL VASCULAR INTERVENTION Left 03/31/2020   Procedure: PERIPHERAL VASCULAR INTERVENTION;  Surgeon: Cherre Robins, MD;  Location: [facility];  Service: Cardiovascular;  Laterality: Left;  superficial femoral   TONSILLECTOMY     TOTAL HIP ARTHROPLASTY Right 04/21/2014   dr Percell Miller   TOTAL HIP ARTHROPLASTY Right 04/21/2014   Procedure: RIGHT TOTAL HIP ARTHROPLASTY ANTERIOR APPROACH;  Surgeon: Renette Butters, MD;  Location: [facility];  Service: Orthopedics;  Laterality: Right;    FAMILY HISTORY Family History  Problem Relation Age of Onset   Healthy Mother    Healthy Father     SOCIAL HISTORY Social History   Tobacco Use   Smoking status: Former    Smokeless tobacco: Never   Tobacco comments:    smoked as a teenager  Scientific laboratory 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 used  Substance Use Topics   Alcohol use: No   Drug use: No         OPHTHALMIC EXAM:  Base Eye Exam     Visual Acuity (ETDRS)       Right Left   Dist [place] 20/100 -1 20/200   Dist ph [place] 20/70 -1 NI         Tonometry (Tonopen, 8:16 AM)       Right Left   Pressure 18 19  Pupils       Dark Light Shape React APD   Right 4 4 Round Minimal None   Left 3 3 Round Minimal None         Extraocular Movement       Right Left    Full Full         Neuro/Psych     Oriented x3: Yes   Mood/Affect: Normal         Dilation     Left eye: 1.0% Mydriacyl, 2.5% Phenylephrine @ 8:17 AM           Slit Lamp and Fundus Exam     External Exam       Right Left   External Normal Normal         Slit Lamp Exam       Right Left   Lids/Lashes Normal Normal   Conjunctiva/Sclera White and quiet 2+ Injection   Cornea Clear Clear   Anterior Chamber Deep and quiet Trace Cell   Iris Round and reactive Pharmacologically dilated   Lens Centered posterior chamber intraocular lens Centered posterior chamber intraocular lens   Anterior Vitreous Normal, clear 1+ cell         Fundus Exam       Right Left   Posterior Vitreous  clear avitric   Disc  Normal   C/D Ratio  0.65   Macula  Good focal laser scars, attached, atrophy present centrally   Vessels  Quiet PDR, good PRP peripherally   Periphery  Attached, good PRP 360            IMAGING AND PROCEDURES  Imaging and Procedures for 12/15/20  OCT, Retina - OU - Both Eyes       Right Eye Quality was good. Scan locations included subfoveal. Central Foveal Thickness: 203. Progression has been stable. Findings include abnormal foveal contour, outer retinal atrophy, inner retinal atrophy, central retinal atrophy.   Left Eye Quality was good. Scan locations included subfoveal. Central Foveal Thickness:  234. Progression has been stable. Findings include abnormal foveal contour, central retinal atrophy, inner retinal atrophy, outer retinal atrophy.   Notes OD, clear media post vitrectomy, silicone oil removal.  OS, clear media, foveal atrophy from prior RPE degeneration and macular nonperfusion     Color Fundus Photography Optos - OU - Both Eyes       Right Eye Progression has been stable. Disc findings include increased cup to disc ratio, pallor. Macula : retinal pigment epithelium abnormalities.   Left Eye Progression has been stable. Disc findings include increased cup to disc ratio. Macula : retinal pigment epithelium abnormalities.   Notes Bilateral quiet PDR, clear media, 1 week post vitrectomy OD no new hemorrhage  OS, with quiescent PDR.  Bilateral foveal atrophy.  Stable looks great 1 week post oil removal               ASSESSMENT/PLAN:  Stable treated proliferative diabetic retinopathy of left eye determined by examination associated with type 2 diabetes mellitus ([place]) 1 week post vitrectomy removal of silicone oil looks great, clear media.  Intraocular pressures normal.  Quiescent PDR unchanged.  Left vision limited by foveal atrophy and RPE degeneration OU.  We will have patient complete topical medications over the next 2 weeks     ICD-10-CM   1. Stable treated proliferative diabetic retinopathy of left eye determined by examination associated with type 2 diabetes mellitus ([place])  [id number] OCT, Retina - OU - Both Eyes  Color Fundus Photography Optos - OU - Both Eyes      1.  Postop week #1 looks great post remote removal of silicone oil OS.  PDR remains quiet  2.  3.  Ophthalmic Meds Ordered this visit:  No orders of the defined types were placed in this encounter.      Return in about 4 months (around 04/16/2021) for COLOR FP, DILATE OU, OCT.  Patient Instructions  Ofloxacin  4 times daily to the operative eye  Prednisolone acetate 1 drop to  the operative eye 4 times daily  Patient instructed not to refill the medications and use them for maximum of 3 weeks.  Patient instructed do not rub the eye.  Patient has the option to use the patch at night.   Patient to discard remainder medications should she have any at the end of 2 weeks   Explained the diagnoses, plan, and follow up with the patient and they expressed understanding.  Patient expressed understanding of the importance of proper follow up care.   Clent Demark Genavive Kubicki M.D. Diseases & Surgery of the Retina and Vitreous Retina & Diabetic [place] 12/15/20     Abbreviations: M myopia (nearsighted); A astigmatism; H hyperopia (farsighted); P presbyopia; Mrx spectacle prescription;  CTL contact lenses; OD right eye; OS left eye; OU both eyes  XT exotropia; ET esotropia; PEK punctate epithelial keratitis; PEE punctate epithelial erosions; DES dry eye syndrome; MGD meibomian gland dysfunction; ATs artificial tears; PFAT's preservative free artificial tears; [place] nuclear sclerotic cataract; PSC posterior subcapsular cataract; ERM epi-retinal membrane; PVD posterior vitreous detachment; RD retinal detachment; DM diabetes mellitus; DR diabetic retinopathy; NPDR non-proliferative diabetic retinopathy; PDR proliferative diabetic retinopathy; CSME clinically significant macular edema; DME diabetic macular edema; dbh dot blot hemorrhages; CWS cotton wool spot; POAG primary open angle glaucoma; C/D cup-to-disc ratio; HVF humphrey visual field; GVF goldmann visual field; OCT optical coherence tomography; IOP intraocular pressure; BRVO Branch retinal vein occlusion; CRVO central retinal vein occlusion; CRAO central retinal artery occlusion; BRAO branch retinal artery occlusion; RT retinal tear; SB scleral buckle; PPV pars plana vitrectomy; VH Vitreous hemorrhage; PRP panretinal laser photocoagulation; IVK intravitreal kenalog; VMT vitreomacular traction; MH Macular hole;  NVD neovascularization of  the disc; NVE neovascularization elsewhere; AREDS age related eye disease study; ARMD age related macular degeneration; POAG primary open angle glaucoma; EBMD epithelial/anterior basement membrane dystrophy; ACIOL anterior chamber intraocular lens; IOL intraocular lens; PCIOL posterior chamber intraocular lens; Phaco/IOL phacoemulsification with intraocular lens placement; [place] photorefractive keratectomy; LASIK laser assisted in situ keratomileusis; HTN hypertension; DM diabetes mellitus; COPD chronic obstructive pulmonary disease

## 2021-01-24 DIAGNOSIS — I10 Essential (primary) hypertension: Secondary | ICD-10-CM | POA: Diagnosis not present

## 2021-01-24 DIAGNOSIS — E1169 Type 2 diabetes mellitus with other specified complication: Secondary | ICD-10-CM | POA: Diagnosis not present

## 2021-01-31 DIAGNOSIS — E785 Hyperlipidemia, unspecified: Secondary | ICD-10-CM | POA: Diagnosis not present

## 2021-01-31 DIAGNOSIS — I10 Essential (primary) hypertension: Secondary | ICD-10-CM | POA: Diagnosis not present

## 2021-01-31 DIAGNOSIS — E875 Hyperkalemia: Secondary | ICD-10-CM | POA: Diagnosis not present

## 2021-01-31 DIAGNOSIS — Z89429 Acquired absence of other toe(s), unspecified side: Secondary | ICD-10-CM | POA: Diagnosis not present

## 2021-01-31 DIAGNOSIS — H3500 Unspecified background retinopathy: Secondary | ICD-10-CM | POA: Diagnosis not present

## 2021-01-31 DIAGNOSIS — E782 Mixed hyperlipidemia: Secondary | ICD-10-CM | POA: Diagnosis not present

## 2021-01-31 DIAGNOSIS — E048 Other specified nontoxic goiter: Secondary | ICD-10-CM | POA: Diagnosis not present

## 2021-01-31 DIAGNOSIS — E11319 Type 2 diabetes mellitus with unspecified diabetic retinopathy without macular edema: Secondary | ICD-10-CM | POA: Diagnosis not present

## 2021-04-08 ENCOUNTER — Encounter (HOSPITAL_COMMUNITY): Payer: Self-pay

## 2021-04-08 ENCOUNTER — Emergency Department (HOSPITAL_COMMUNITY): Payer: Medicare Other

## 2021-04-08 ENCOUNTER — Inpatient Hospital Stay (HOSPITAL_COMMUNITY)
Admission: EM | Admit: 2021-04-08 | Discharge: 2021-04-11 | DRG: 871 | Disposition: A | Discharge status: Home / Self Care | Payer: Medicare Other | Attending: Internal Medicine | Admitting: Internal Medicine

## 2021-04-08 ENCOUNTER — Other Ambulatory Visit: Payer: Self-pay

## 2021-04-08 DIAGNOSIS — Z885 Allergy status to narcotic agent status: Secondary | ICD-10-CM

## 2021-04-08 DIAGNOSIS — Z79899 Other long term (current) drug therapy: Secondary | ICD-10-CM | POA: Diagnosis not present

## 2021-04-08 DIAGNOSIS — I248 Other forms of acute ischemic heart disease: Secondary | ICD-10-CM | POA: Diagnosis not present

## 2021-04-08 DIAGNOSIS — I119 Hypertensive heart disease without heart failure: Secondary | ICD-10-CM | POA: Diagnosis not present

## 2021-04-08 DIAGNOSIS — R059 Cough, unspecified: Secondary | ICD-10-CM | POA: Diagnosis not present

## 2021-04-08 DIAGNOSIS — Z89412 Acquired absence of left great toe: Secondary | ICD-10-CM | POA: Diagnosis not present

## 2021-04-08 DIAGNOSIS — E871 Hypo-osmolality and hyponatremia: Secondary | ICD-10-CM | POA: Diagnosis present

## 2021-04-08 DIAGNOSIS — A4189 Other specified sepsis: Principal | ICD-10-CM | POA: Diagnosis present

## 2021-04-08 DIAGNOSIS — L89151 Pressure ulcer of sacral region, stage 1: Secondary | ICD-10-CM | POA: Diagnosis not present

## 2021-04-08 DIAGNOSIS — I471 Supraventricular tachycardia: Secondary | ICD-10-CM | POA: Diagnosis not present

## 2021-04-08 DIAGNOSIS — E1110 Type 2 diabetes mellitus with ketoacidosis without coma: Secondary | ICD-10-CM | POA: Diagnosis not present

## 2021-04-08 DIAGNOSIS — Z20822 Contact with and (suspected) exposure to covid-19: Secondary | ICD-10-CM | POA: Diagnosis present

## 2021-04-08 DIAGNOSIS — I517 Cardiomegaly: Secondary | ICD-10-CM | POA: Diagnosis not present

## 2021-04-08 DIAGNOSIS — R000 Tachycardia, unspecified: Secondary | ICD-10-CM | POA: Diagnosis not present

## 2021-04-08 DIAGNOSIS — Z6829 Body mass index (BMI) 29.0-29.9, adult: Secondary | ICD-10-CM | POA: Diagnosis not present

## 2021-04-08 DIAGNOSIS — E876 Hypokalemia: Secondary | ICD-10-CM | POA: Diagnosis not present

## 2021-04-08 DIAGNOSIS — Z90710 Acquired absence of both cervix and uterus: Secondary | ICD-10-CM

## 2021-04-08 DIAGNOSIS — A419 Sepsis, unspecified organism: Secondary | ICD-10-CM | POA: Diagnosis not present

## 2021-04-08 DIAGNOSIS — E11319 Type 2 diabetes mellitus with unspecified diabetic retinopathy without macular edema: Secondary | ICD-10-CM | POA: Diagnosis present

## 2021-04-08 DIAGNOSIS — J189 Pneumonia, unspecified organism: Secondary | ICD-10-CM

## 2021-04-08 DIAGNOSIS — Z96641 Presence of right artificial hip joint: Secondary | ICD-10-CM | POA: Diagnosis not present

## 2021-04-08 DIAGNOSIS — R6520 Severe sepsis without septic shock: Secondary | ICD-10-CM | POA: Diagnosis present

## 2021-04-08 DIAGNOSIS — L8990 Pressure ulcer of unspecified site, unspecified stage: Secondary | ICD-10-CM | POA: Insufficient documentation

## 2021-04-08 DIAGNOSIS — N179 Acute kidney failure, unspecified: Secondary | ICD-10-CM | POA: Diagnosis not present

## 2021-04-08 DIAGNOSIS — J690 Pneumonitis due to inhalation of food and vomit: Secondary | ICD-10-CM | POA: Diagnosis not present

## 2021-04-08 DIAGNOSIS — Z7984 Long term (current) use of oral hypoglycemic drugs: Secondary | ICD-10-CM | POA: Diagnosis not present

## 2021-04-08 DIAGNOSIS — R0602 Shortness of breath: Secondary | ICD-10-CM | POA: Diagnosis not present

## 2021-04-08 DIAGNOSIS — Z888 Allergy status to other drugs, medicaments and biological substances status: Secondary | ICD-10-CM | POA: Diagnosis not present

## 2021-04-08 DIAGNOSIS — E785 Hyperlipidemia, unspecified: Secondary | ICD-10-CM | POA: Diagnosis not present

## 2021-04-08 DIAGNOSIS — I2699 Other pulmonary embolism without acute cor pulmonale: Secondary | ICD-10-CM | POA: Diagnosis not present

## 2021-04-08 LAB — BETA-HYDROXYBUTYRIC ACID
Beta-Hydroxybutyric Acid: >8 mmol/L — ABNORMAL HIGH (ref 0.05–0.27)
Beta-Hydroxybutyric Acid: 7.96 mmol/L — ABNORMAL HIGH (ref 0.05–0.27)

## 2021-04-08 LAB — BASIC METABOLIC PANEL
Anion gap: 30 — ABNORMAL HIGH (ref 5–15)
Anion gap: 24 — ABNORMAL HIGH (ref 5–15)
Anion gap: 23 — ABNORMAL HIGH (ref 5–15)
BUN: 29 mg/dL — ABNORMAL HIGH (ref 8–23)
BUN: 29 mg/dL — ABNORMAL HIGH (ref 8–23)
BUN: 29 mg/dL — ABNORMAL HIGH (ref 8–23)
BUN: 28 mg/dL — ABNORMAL HIGH (ref 8–23)
BUN: 25 mg/dL — ABNORMAL HIGH (ref 8–23)
CO2: 7 mmol/L — ABNORMAL LOW (ref 22–32)
CO2: <7 mmol/L — ABNORMAL LOW (ref 22–32)
CO2: <7 mmol/L — ABNORMAL LOW (ref 22–32)
CO2: 7 mmol/L — ABNORMAL LOW (ref 22–32)
CO2: 7 mmol/L — ABNORMAL LOW (ref 22–32)
Calcium: 10 mg/dL (ref 8.9–10.3)
Calcium: 8.8 mg/dL — ABNORMAL LOW (ref 8.9–10.3)
Calcium: 9.3 mg/dL (ref 8.9–10.3)
Calcium: 8.9 mg/dL (ref 8.9–10.3)
Calcium: 9.1 mg/dL (ref 8.9–10.3)
Chloride: 97 mmol/L — ABNORMAL LOW (ref 98–111)
Chloride: 98 mmol/L (ref 98–111)
Chloride: 101 mmol/L (ref 98–111)
Chloride: 107 mmol/L (ref 98–111)
Chloride: 109 mmol/L (ref 98–111)
Creatinine, Ser: 1.48 mg/dL — ABNORMAL HIGH (ref 0.44–1.00)
Creatinine, Ser: 1.37 mg/dL — ABNORMAL HIGH (ref 0.44–1.00)
Creatinine, Ser: 1.34 mg/dL — ABNORMAL HIGH (ref 0.44–1.00)
Creatinine, Ser: 1.12 mg/dL — ABNORMAL HIGH (ref 0.44–1.00)
Creatinine, Ser: 1.1 mg/dL — ABNORMAL HIGH (ref 0.44–1.00)
GFR, Estimated: 39 mL/min — ABNORMAL LOW (ref >60)
GFR, Estimated: 43 mL/min — ABNORMAL LOW (ref >60)
GFR, Estimated: 44 mL/min — ABNORMAL LOW (ref >60)
GFR, Estimated: 54 mL/min — ABNORMAL LOW (ref >60)
GFR, Estimated: 55 mL/min — ABNORMAL LOW (ref >60)
Glucose, Bld: 327 mg/dL — ABNORMAL HIGH (ref 70–99)
Glucose, Bld: 293 mg/dL — ABNORMAL HIGH (ref 70–99)
Glucose, Bld: 266 mg/dL — ABNORMAL HIGH (ref 70–99)
Glucose, Bld: 165 mg/dL — ABNORMAL HIGH (ref 70–99)
Glucose, Bld: 152 mg/dL — ABNORMAL HIGH (ref 70–99)
Potassium: 4.7 mmol/L (ref 3.5–5.1)
Potassium: 4.1 mmol/L (ref 3.5–5.1)
Potassium: 3.6 mmol/L (ref 3.5–5.1)
Potassium: 4.1 mmol/L (ref 3.5–5.1)
Potassium: 3.5 mmol/L (ref 3.5–5.1)
Sodium: 134 mmol/L — ABNORMAL LOW (ref 135–145)
Sodium: 132 mmol/L — ABNORMAL LOW (ref 135–145)
Sodium: 136 mmol/L (ref 135–145)
Sodium: 138 mmol/L (ref 135–145)
Sodium: 139 mmol/L (ref 135–145)

## 2021-04-08 LAB — CBC WITH DIFFERENTIAL/PLATELET
Band Neutrophils: 25 %
Basophils Absolute: 0 10*3/uL (ref 0.0–0.1)
Basophils Relative: 0 %
Eosinophils Absolute: 0 10*3/uL (ref 0.0–0.5)
Eosinophils Relative: 0 %
HCT: 49.7 % — ABNORMAL HIGH (ref 36.0–46.0)
Hemoglobin: 15.2 g/dL — ABNORMAL HIGH (ref 12.0–15.0)
Lymphocytes Relative: 3 %
Lymphs Abs: 0.9 10*3/uL (ref 0.7–4.0)
MCH: 29.9 pg (ref 26.0–34.0)
MCHC: 30.6 g/dL (ref 30.0–36.0)
MCV: 97.6 fL (ref 80.0–100.0)
Metamyelocytes Relative: 3 %
Monocytes Absolute: 1.4 10*3/uL — ABNORMAL HIGH (ref 0.1–1.0)
Monocytes Relative: 5 %
Neutro Abs: 25.7 10*3/uL — ABNORMAL HIGH (ref 1.7–7.7)
Neutrophils Relative %: 64 %
Platelets: 438 10*3/uL — ABNORMAL HIGH (ref 150–400)
RBC: 5.09 MIL/uL (ref 3.87–5.11)
RDW: 13.7 % (ref 11.5–15.5)
WBC Morphology: INCREASED
WBC: 28.9 10*3/uL — ABNORMAL HIGH (ref 4.0–10.5)
nRBC: 0 % (ref 0.0–0.2)

## 2021-04-08 LAB — URINALYSIS, ROUTINE W REFLEX MICROSCOPIC
Bacteria, UA: NONE SEEN
Bilirubin Urine: NEGATIVE
Glucose, UA: >=500 mg/dL — AB
Ketones, ur: 80 mg/dL — AB
Leukocytes,Ua: NEGATIVE
Nitrite: NEGATIVE
Protein, ur: 100 mg/dL — AB
Specific Gravity, Urine: 1.026 (ref 1.005–1.030)
pH: 5 (ref 5.0–8.0)

## 2021-04-08 LAB — TROPONIN I (HIGH SENSITIVITY)
Troponin I (High Sensitivity): 203 ng/L (ref <18)
Troponin I (High Sensitivity): 224 ng/L (ref <18)

## 2021-04-08 LAB — BLOOD GAS, VENOUS
Acid-base deficit: 23.5 mmol/L — ABNORMAL HIGH (ref 0.0–2.0)
Bicarbonate: 7.2 mmol/L — ABNORMAL LOW (ref 20.0–28.0)
Drawn by: 6509
FIO2: 21
O2 Saturation: 71.5 %
Patient temperature: 36.4
pCO2, Ven: 24.4 mmHg — ABNORMAL LOW (ref 44.0–60.0)
pH, Ven: 6.991 — CL (ref 7.250–7.430)
pO2, Ven: 43.9 mmHg (ref 32.0–45.0)

## 2021-04-08 LAB — GLUCOSE, CAPILLARY
Glucose-Capillary: 166 mg/dL — ABNORMAL HIGH (ref 70–99)
Glucose-Capillary: 104 mg/dL — ABNORMAL HIGH (ref 70–99)
Glucose-Capillary: 135 mg/dL — ABNORMAL HIGH (ref 70–99)
Glucose-Capillary: 135 mg/dL — ABNORMAL HIGH (ref 70–99)
Glucose-Capillary: 138 mg/dL — ABNORMAL HIGH (ref 70–99)
Glucose-Capillary: 155 mg/dL — ABNORMAL HIGH (ref 70–99)
Glucose-Capillary: 138 mg/dL — ABNORMAL HIGH (ref 70–99)

## 2021-04-08 LAB — CBG MONITORING, ED
Glucose-Capillary: 264 mg/dL — ABNORMAL HIGH (ref 70–99)
Glucose-Capillary: 232 mg/dL — ABNORMAL HIGH (ref 70–99)
Glucose-Capillary: 207 mg/dL — ABNORMAL HIGH (ref 70–99)
Glucose-Capillary: 177 mg/dL — ABNORMAL HIGH (ref 70–99)

## 2021-04-08 LAB — HEMOGLOBIN A1C
Hgb A1c MFr Bld: 7.4 % — ABNORMAL HIGH (ref 4.8–5.6)
Mean Plasma Glucose: 165.68 mg/dL

## 2021-04-08 LAB — MAGNESIUM: Magnesium: 2.6 mg/dL — ABNORMAL HIGH (ref 1.7–2.4)

## 2021-04-08 LAB — RESP PANEL BY RT-PCR (FLU A&B, COVID) ARPGX2
Influenza A by PCR: NEGATIVE
Influenza B by PCR: NEGATIVE
SARS Coronavirus 2 by RT PCR: NEGATIVE

## 2021-04-08 LAB — HIV ANTIBODY (ROUTINE TESTING W REFLEX): HIV Screen 4th Generation wRfx: NONREACTIVE

## 2021-04-08 LAB — MRSA NEXT GEN BY PCR, NASAL: MRSA by PCR Next Gen: NOT DETECTED

## 2021-04-08 LAB — BRAIN NATRIURETIC PEPTIDE: B Natriuretic Peptide: 399 pg/mL — ABNORMAL HIGH (ref 0.0–100.0)

## 2021-04-08 MED ORDER — LACTATED RINGERS IV BOLUS
500.0000 mL | Freq: Once | INTRAVENOUS | Status: AC
  Administered 2021-04-08: 500 mL via INTRAVENOUS

## 2021-04-08 MED ORDER — SODIUM CHLORIDE 0.9 % IV SOLN
3.0000 g | Freq: Three times a day (TID) | INTRAVENOUS | Status: DC
  Administered 2021-04-08 – 2021-04-11 (×9): 3 g via INTRAVENOUS
  Filled 2021-04-08 (×13): qty 8

## 2021-04-08 MED ORDER — DOXYCYCLINE HYCLATE 100 MG PO TABS
100.0000 mg | ORAL_TABLET | Freq: Once | ORAL | Status: AC
  Administered 2021-04-08: 100 mg via ORAL
  Filled 2021-04-08: qty 1

## 2021-04-08 MED ORDER — SODIUM CHLORIDE 0.9 % IV SOLN: 500.0000 mg | INTRAVENOUS | Status: DC

## 2021-04-08 MED ORDER — LACTATED RINGERS IV SOLN: INTRAVENOUS | Status: DC

## 2021-04-08 MED ORDER — LACTATED RINGERS IV BOLUS
20.0000 mL/kg | Freq: Once | INTRAVENOUS | Status: AC
  Administered 2021-04-08: 1632 mL via INTRAVENOUS

## 2021-04-08 MED ORDER — CHLORHEXIDINE GLUCONATE CLOTH 2 % EX PADS
6.0000 | MEDICATED_PAD | Freq: Every day | CUTANEOUS | Status: DC
  Administered 2021-04-09 – 2021-04-11 (×3): 6 via TOPICAL

## 2021-04-08 MED ORDER — DEXTROSE 50 % IV SOLN: 0.0000 mL | INTRAVENOUS | Status: DC | PRN

## 2021-04-08 MED ORDER — ALUM & MAG HYDROXIDE-SIMETH 200-200-20 MG/5ML PO SUSP
15.0000 mL | ORAL | Status: DC | PRN
  Filled 2021-04-08: qty 30

## 2021-04-08 MED ORDER — POTASSIUM CHLORIDE 10 MEQ/100ML IV SOLN
10.0000 meq | INTRAVENOUS | Status: AC
  Administered 2021-04-08 (×2): 10 meq via INTRAVENOUS
  Filled 2021-04-08 (×2): qty 100

## 2021-04-08 MED ORDER — ONDANSETRON HCL 4 MG/2ML IJ SOLN
4.0000 mg | Freq: Once | INTRAMUSCULAR | Status: AC
  Administered 2021-04-08: 4 mg via INTRAVENOUS
  Filled 2021-04-08: qty 2

## 2021-04-08 MED ORDER — DEXTROSE IN LACTATED RINGERS 5 % IV SOLN: INTRAVENOUS | Status: DC

## 2021-04-08 MED ORDER — ACETAMINOPHEN 325 MG PO TABS: 650.0000 mg | ORAL_TABLET | Freq: Four times a day (QID) | ORAL | Status: DC | PRN

## 2021-04-08 MED ORDER — INSULIN REGULAR(HUMAN) IN NACL 100-0.9 UT/100ML-% IV SOLN
INTRAVENOUS | Status: DC
  Administered 2021-04-08: 9.5 [IU]/h via INTRAVENOUS
  Filled 2021-04-08: qty 100

## 2021-04-08 MED ORDER — IOHEXOL 350 MG/ML SOLN
75.0000 mL | Freq: Once | INTRAVENOUS | Status: AC | PRN
  Administered 2021-04-08: 75 mL via INTRAVENOUS

## 2021-04-08 MED ORDER — SODIUM CHLORIDE 0.9 % IV BOLUS
1000.0000 mL | Freq: Once | INTRAVENOUS | Status: AC
  Administered 2021-04-08: 1000 mL via INTRAVENOUS

## 2021-04-08 MED ORDER — SODIUM CHLORIDE 0.9 % IV SOLN: 2.0000 g | INTRAVENOUS | Status: DC

## 2021-04-08 MED ORDER — SODIUM CHLORIDE 0.9 % IV SOLN
500.0000 mg | INTRAVENOUS | Status: DC
  Administered 2021-04-08 – 2021-04-10 (×3): 500 mg via INTRAVENOUS
  Filled 2021-04-08 (×3): qty 5

## 2021-04-08 MED ORDER — ONDANSETRON HCL 4 MG/2ML IJ SOLN
4.0000 mg | Freq: Four times a day (QID) | INTRAMUSCULAR | Status: DC | PRN
  Administered 2021-04-09 (×2): 4 mg via INTRAVENOUS
  Filled 2021-04-08 (×2): qty 2

## 2021-04-08 MED ORDER — ENOXAPARIN SODIUM 40 MG/0.4ML IJ SOSY
40.0000 mg | PREFILLED_SYRINGE | INTRAMUSCULAR | Status: DC
  Administered 2021-04-08 – 2021-04-11 (×4): 40 mg via SUBCUTANEOUS
  Filled 2021-04-08 (×4): qty 0.4

## 2021-04-08 MED ORDER — SODIUM CHLORIDE 0.9 % IV SOLN
1.0000 g | Freq: Once | INTRAVENOUS | Status: AC
  Administered 2021-04-08: 1 g via INTRAVENOUS
  Filled 2021-04-08: qty 10

## 2021-04-08 NOTE — ED Provider Notes (Signed)
[facility] EMERGENCY DEPARTMENT Provider Note   CSN: [id number] Arrival date & time: 04/08/21  1941     History  Chief Complaint  Patient presents with   Shortness of Breath    [name] is a 66 y.o. female.  HPI     Home Medications Prior to Admission medications   Medication Sig Start Date End Date Taking? Authorizing Provider  acidophilus (RISAQUAD) CAPS capsule Take 1 capsule by mouth daily.   Yes [provider]  APPLE CIDER VINEGAR PO Take 4 tablets by mouth daily.   Yes [provider]  glipiZIDE (GLUCOTROL) 10 MG tablet Take 10 mg by mouth 2 (two) times daily. 03/01/20  Yes [provider]  Glucosamine-Chondroitin (OSTEO BI-FLEX REGULAR STRENGTH PO) Take 2 tablets by mouth daily.   Yes [provider]  lisinopril-hydrochlorothiazide (ZESTORETIC) 20-12.5 MG tablet Take by mouth daily.   Yes [provider]  Multiple Vitamins-Minerals (MULTIVITAMIN PO) Take 1 tablet by mouth daily.   Yes [provider]  Multiple Vitamins-Minerals (OCUVITE ADULT 50+ PO) Take 1 tablet by mouth daily.   Yes [provider]  NEXLETOL 180 MG TABS Take 1 tablet by mouth daily. 02/08/21  Yes [provider]  SYNJARDY XR 01-999 MG TB24 Take 2 tablets by mouth daily. 04/06/21  Yes [provider]  doxycycline (VIBRA-TABS) 100 MG tablet Take 1 tablet (100 mg total) by mouth 2 (two) times daily. Patient not taking: Reported on 04/08/2021 06/05/20   Kathie Dike, MD  HYDROcodone-acetaminophen (NORCO/VICODIN) 5-325 MG tablet Take 1 tablet by mouth every 4 (four) hours as needed for moderate pain. Patient not taking: Reported on 04/08/2021 06/11/20   Persons, Bevely Palmer, [place]      Allergies    Anesthetics, amide and Codeine    Review of Systems   Review of Systems  Physical Exam Updated Vital Signs BP 128/67    Pulse (!) 133    Temp 97.7 F (36.5 C) (Oral)    Resp (!) 32    Ht 5\' 6"  (1.676 m)    Wt 81.6 kg    SpO2  100%    BMI 29.05 kg/m  Physical Exam  ED Results / Procedures / Treatments   Labs (all labs ordered are listed, but only abnormal results are displayed) Labs Reviewed  BASIC METABOLIC PANEL - Abnormal; Notable for the following components:      Result Value   Sodium 134 (*)    Chloride 97 (*)    CO2 7 (*)    Glucose, Bld 327 (*)    BUN 29 (*)    Creatinine, Ser 1.48 (*)    GFR, Estimated 39 (*)    Anion gap 30 (*)    All other components within normal limits  CBC WITH DIFFERENTIAL/PLATELET - Abnormal; Notable for the following components:   WBC 28.9 (*)    Hemoglobin 15.2 (*)    HCT 49.7 (*)    Platelets 438 (*)    Neutro Abs 25.7 (*)    Monocytes Absolute 1.4 (*)    All other components within normal limits  BRAIN NATRIURETIC PEPTIDE - Abnormal; Notable for the following components:   B Natriuretic Peptide 399.0 (*)    All other components within normal limits  MAGNESIUM - Abnormal; Notable for the following components:   Magnesium 2.6 (*)    All other components within normal limits  BLOOD GAS, VENOUS - Abnormal; Notable for the following components:   pH, Ven 6.991 (*)  pCO2, Ven 24.4 (*)    Bicarbonate 7.2 (*)    Acid-base deficit 23.5 (*)    All other components within normal limits  BETA-HYDROXYBUTYRIC ACID - Abnormal; Notable for the following components:   Beta-Hydroxybutyric Acid >8.00 (*)    All other components within normal limits  BASIC METABOLIC PANEL - Abnormal; Notable for the following components:   Sodium 132 (*)    CO2 <7 (*)    Glucose, Bld 293 (*)    BUN 29 (*)    Creatinine, Ser 1.37 (*)    Calcium 8.8 (*)    GFR, Estimated 43 (*)    All other components within normal limits  BASIC METABOLIC PANEL - Abnormal; Notable for the following components:   CO2 <7 (*)    Glucose, Bld 266 (*)    BUN 29 (*)    Creatinine, Ser 1.34 (*)    GFR, Estimated 44 (*)    All other components within normal limits  CBG MONITORING, ED - Abnormal; Notable  for the following components:   Glucose-Capillary 264 (*)    All other components within normal limits  CBG MONITORING, ED - Abnormal; Notable for the following components:   Glucose-Capillary 232 (*)    All other components within normal limits  CBG MONITORING, ED - Abnormal; Notable for the following components:   Glucose-Capillary 207 (*)    All other components within normal limits  TROPONIN I (HIGH SENSITIVITY) - Abnormal; Notable for the following components:   Troponin I (High Sensitivity) 203 (*)    All other components within normal limits  TROPONIN I (HIGH SENSITIVITY) - Abnormal; Notable for the following components:   Troponin I (High Sensitivity) 224 (*)    All other components within normal limits  RESP PANEL BY RT-PCR (FLU A&B, COVID) ARPGX2  URINALYSIS, ROUTINE W REFLEX MICROSCOPIC  BASIC METABOLIC PANEL  BASIC METABOLIC PANEL  BASIC METABOLIC PANEL  BETA-HYDROXYBUTYRIC ACID  HEMOGLOBIN A1C  HIV ANTIBODY (ROUTINE TESTING W REFLEX)    EKG EKG Interpretation  Date/Time:  Friday April 08 2021 09:37:59 EST Ventricular Rate:  126 PR Interval:  140 QRS Duration: 93 QT Interval:  317 QTC Calculation: 459 R Axis:   -9 Text Interpretation: Age not entered, assumed to be  66 years old for purpose of ECG interpretation Sinus tachycardia Anterior infarct, old Borderline ST depression, lateral leads Confirmed by Octaviano Glow [id number]) on 04/08/2021 3:18:36 PM  Radiology CT Angio Chest PE W and/or Wo Contrast  Result Date: 04/08/2021 CLINICAL DATA:  Pulmonary embolism (PE) suspected, high prob EXAM: CT ANGIOGRAPHY CHEST WITH CONTRAST TECHNIQUE: Multidetector CT imaging of the chest was performed using the standard protocol during bolus administration of intravenous contrast. Multiplanar CT image reconstructions and MIPs were obtained to evaluate the vascular anatomy. CONTRAST:  17mL OMNIPAQUE IOHEXOL 350 MG/ML SOLN COMPARISON:  Chest XR, concurrent. FINDINGS: Suboptimal  evaluation, secondary to motion degradation. Cardiovascular: Degraded evaluation such that subsegmental and small segmental pulmonary emboli could missed. No segmental or larger pulmonary embolus. No pulmonary arterial dilation. Normal heart size. No pericardial effusion. A moderate burden of multivessel coronary atherosclerosis is present. Mediastinum/Nodes: No enlarged mediastinal, hilar, or axillary lymph nodes. Marked thyromegaly, involving the LEFT thyroid gland and extending outside the field of view, measuring approximally 4.5 x 6.5 cm. Layering fluid with air-and-fluid level within the esophagus. The trachea demonstrates no significant findings. Lungs/Pleura: LEFT posterior basilar consolidation. No pleural effusion or pneumothorax. Upper Abdomen: Nodular thickening of the LEFT adrenal gland without discrete lesion  mild fluid distention of stomach. Musculoskeletal: No chest wall abnormality. No acute or significant osseous findings. Review of the MIP images confirms the above findings. IMPRESSION: Suboptimal evaluation, within these constraints; 1. No segmental or larger pulmonary embolus. 2. LEFT lower lobe consolidation. Findings highly suspicious for aspiration pneumonia/pneumonitis given fluid-distended stomach and esophagus. 3. Goiter with marked enlargement of the LEFT thyroid gland, incompletely assessed. Recommend non-emergent, outpatient US Thyroid if not already performed. Electronically Signed   By: Michaelle Birks M.D.   On: 04/08/2021 10:24   DG Chest Portable 1 View  Result Date: 04/08/2021 CLINICAL DATA:  Shortness of breath, likely positive COVID. Shortness of breath and fever and cough which started Sunday. EXAM: PORTABLE CHEST 1 VIEW COMPARISON:  Chest radiograph dated April 20, 2019 FINDINGS: The heart is enlarged. Atherosclerotic calcification of aortic arch. Low lung volumes. No focal consolidation or large pleural effusion. IMPRESSION: 1. Stable cardiomegaly. 2. Low lung volumes  without focal consolidation or pleural effusion. Electronically Signed   By: Keane Police D.O.   On: 04/08/2021 08:34    Procedures Procedures    Medications Ordered in ED Medications  cefTRIAXone (ROCEPHIN) 1 g in sodium chloride 0.9 % 100 mL IVPB (1 g Intravenous New Bag/Given 04/08/21 1458)  enoxaparin (LOVENOX) injection 40 mg (40 mg Subcutaneous Given 04/08/21 1305)  ondansetron (ZOFRAN) injection 4 mg (has no administration in time range)  acetaminophen (TYLENOL) tablet 650 mg (has no administration in time range)  azithromycin (ZITHROMAX) 500 mg in sodium chloride 0.9 % 250 mL IVPB (500 mg Intravenous New Bag/Given 04/08/21 1247)  insulin regular, human (MYXREDLIN) 100 units/ 100 mL infusion (9.5 Units/hr Intravenous New Bag/Given 04/08/21 1303)  lactated ringers infusion (has no administration in time range)  dextrose 5 % in lactated ringers infusion ( Intravenous New Bag/Given 04/08/21 1407)  dextrose 50 % solution 0-50 mL (has no administration in time range)  Ampicillin-Sulbactam (UNASYN) 3 g in sodium chloride 0.9 % 100 mL IVPB (has no administration in time range)  sodium chloride 0.9 % bolus 1,000 mL (1,000 mLs Intravenous Bolus 04/08/21 0910)  ondansetron (ZOFRAN) injection 4 mg (4 mg Intravenous Given 04/08/21 0912)  iohexol (OMNIPAQUE) 350 MG/ML injection 75 mL (75 mLs Intravenous Contrast Given 04/08/21 0951)  doxycycline (VIBRA-TABS) tablet 100 mg (100 mg Oral Given 04/08/21 1248)  lactated ringers bolus 1,632 mL (1,632 mLs Intravenous Bolus 04/08/21 1241)  potassium chloride 10 mEq in 100 mL IVPB (10 mEq Intravenous New Bag/Given 04/08/21 1418)    ED Course/ Medical Decision Making/ A&P Clinical Course as of 04/08/21 1519  Fri Apr 08, 2021  0919 Anion gap(!): 30 [MT]  0919 Creatinine(!): 1.48 [MT]  8101 Patient was reassessed.  She remained stable on room air, continues to be tachypneic.  She is still speaking in full sentences.  I updated her regarding her work-up, which is concern for  possible congestive heart failure, unclear whether there may be an underlying pneumonia or even a pulmonary embolism with her troponin elevation.  I advised the CT PE scan and she is in agreement with this.  She will also need her COVID swab [MT]  0932 Troponin I (High Sensitivity)(!!): 203 [MT]  1031 No large centralized PE on CT, although motion degraded imaging.  LLL consolidation consistent with PNA.  Antibiotics ordered for CAP. [MT]  7510 Patient care was discussed with the hospitalist who will take over for admission.  With her elevated blood sugars and anion gap and history of diabetes, there is a possibility of  diabetic ketoacidosis.  Beta hydroxy level and blood gas have been ordered, the hospitalist will initiate insulin as needed [MT]    Clinical Course User Index [MT] Launa Goedken, Carola Rhine, MD                           Medical Decision Making  Patient presents emergency department with shortness of breath for 5 days, general fatigue.  She feels she may have COVID.  She reports that Christmas dinner, 5 of the 8 people in her house ended up developing COVID, including her husband whom she lives with.  She herself began having symptoms 5 days ago on Sunday.  This is involved shortness of breath, malaise, headache, muscle aches, poor appetite.  She denies diarrhea.  She says it feels exactly like when she had COVID 2 or 3 years ago.  She feels generally weak.  She reports subjective fevers and chills.  She denies history of smoking or significant underlying lung condition, she does report a history of diabetes and takes janumet  On exam the patient is afebrile, tachycardic, she does have some tachypnea with a respiratory rate in the high 20s.  She has some rhonchi in the right lower lobe.  No audible wheezing.  This patient presents to the ED for concern of shortness of breath, this involves an extensive number of treatment options, and is a complaint that carries with it a high risk of  complications and morbidity.  The differential diagnosis includes viral syndrome most likely including COVID-19 versus new onset congestive heart failure versus bacterial pneumonia versus anemia versus other   Co morbidities that complicate the patient evaluation  History of diabetes    Lab Tests:  I ordered and personally interpreted labs.  The pertinent results include:  aniona gap 30, elevated BUN & CR, K wnl, Trop's elevated, flat on repeat, WBC 28.9.  Flu/covid negative   Imaging Studies ordered:  I ordered imaging studies including xray of the chest, CTPE I independently visualized and interpreted imaging which showed likely LLL PNA, no large PE, motion degraded study I agree with the radiologist interpretation   Cardiac Monitoring:  The patient was maintained on a cardiac monitor.  I personally viewed and interpreted the cardiac monitored which showed an underlying rhythm of: sinus tachycardia   Medicines ordered and prescription drug management:  I ordered medication including IV fluids, IV zofran for dehydration, nausea Reevaluation of the patient after these medicines showed that the patient improved I have reviewed the patients home medicines and have made adjustments as needed  Reevaluation:  After the interventions noted above, I reevaluated the patient and found that they have :improved   Dispostion:  After consideration of the diagnostic results and the patients response to treatment, I feel that the patent would benefit from hospitalization  Clinical Course as of 04/08/21 1519  Fri Apr 08, 2021  0919 Anion gap(!): 30 [MT]  0919 Creatinine(!): 1.48 [MT]  4196 Patient was reassessed.  She remained stable on room air, continues to be tachypneic.  She is still speaking in full sentences.  I updated her regarding her work-up, which is concern for possible congestive heart failure, unclear whether there may be an underlying pneumonia or even a pulmonary embolism  with her troponin elevation.  I advised the CT PE scan and she is in agreement with this.  She will also need her COVID swab [MT]  0932 Troponin I (High Sensitivity)(!!): 203 [MT]  1031  No large centralized PE on CT, although motion degraded imaging.  LLL consolidation consistent with PNA.  Antibiotics ordered for CAP. [MT]  5929 Patient care was discussed with the hospitalist who will take over for admission.  With her elevated blood sugars and anion gap and history of diabetes, there is a possibility of diabetic ketoacidosis.  Beta hydroxy level and blood gas have been ordered, the hospitalist will initiate insulin as needed [MT]    Clinical Course User Index [MT] Adelyne Marchese, Carola Rhine, MD     Final Clinical Impression(s) / ED Diagnoses Final diagnoses:  Diabetic ketoacidosis without coma associated with type 2 diabetes mellitus ([place])  Community acquired pneumonia of left lower lobe of lung    Rx / DC Orders ED Discharge Orders     None         Wyvonnia Dusky, MD 04/08/21 [phone]

## 2021-04-08 NOTE — ED Notes (Signed)
Date and time results received: 04/08/21 10:39 AM  Test: Troponin Critical Value: 224  Name of Provider Notified: Dr. Langston Masker  Orders Received? Or Actions Taken?: see orders

## 2021-04-08 NOTE — Progress Notes (Addendum)
Inpatient Diabetes Program Recommendations  AACE/ADA: New Consensus Statement on Inpatient Glycemic Control   Target Ranges:  Prepandial:   less than 140 mg/dL      Peak postprandial:   less than 180 mg/dL (1-2 hours)      Critically ill patients:  140 - 180 mg/dL    Latest Reference Range & Units 04/08/21 08:18 04/08/21 11:24  CO2 22 - 32 mmol/L 7 (L) <7 (L)  Glucose 70 - 99 mg/dL 327 (H) 293 (H)  Anion gap 5 - 15  30 (H) NOT CALCULATED    Latest Reference Range & Units 06/03/20 17:12  Hemoglobin A1C 4.8 - 5.6 % 7.2 (H)   Review of Glycemic Control  Diabetes history: DM2 Outpatient Diabetes medications: Synjardy XR 20-2000 daily, Glipizide 10 mg BID Current orders for Inpatient glycemic control: IV insulin  NOTE: Patient is currently in the ER and just ordered IV insulin. Per labs, patient is acidotic with initial glucose 327 mg/dl, CO2 7, Anion Gap 30. Noted consult; diabetes coordinator working remotely today. Sent communication to RN to ask if there is a phone number I can use to call patient and talk with her over the phone. Agree with current orders for IV insulin.   Addendum 04/08/21@14 :15-Called ER [phone]) and transferred to patient's room phone but no answer.  Thanks, Barnie Alderman, RN, MSN, CDE Diabetes Coordinator Inpatient Diabetes Program [phone] (Team Pager from 8am to 5pm)

## 2021-04-08 NOTE — ED Triage Notes (Signed)
Pt presents to ED with complaints of shortness of breath, cough, fever started Sunday. Pt states she thinks she has covid again.

## 2021-04-08 NOTE — Progress Notes (Addendum)
Pharmacy Antibiotic Note  [name] is a 66 y.o. female admitted on 04/08/2021 with aspiration pneumonia.  Pharmacy has been consulted for unasyn dosing.  Plan: Unasyn 3gm IV q8h F/u cxs and clinical progress Monitor V/S, and labs  Height: 5\' 6"  (473.4 cm) Weight: 81.6 kg (180 lb) IBW/kg (Calculated) : 59.3  Temp (24hrs), Avg:97.7 F (36.5 C), Min:97.7 F (36.5 C), Max:97.7 F (36.5 C)  Recent Labs  Lab 04/08/21 0818 04/08/21 1124  WBC 28.9*  --   CREATININE 1.48* 1.37*    Estimated Creatinine Clearance: 43.5 mL/min (A) (by C-G formula based on SCr of 1.37 mg/dL (H)).    Allergies  Allergen Reactions   Anesthetics, Amide Nausea And Vomiting   Codeine Nausea And Vomiting    Antimicrobials this admission: unasyn 1/6 >>  Azithromycin  1/ 6>>  Ceftriaxone 1/56 x 1 dose  Microbiology results: No cultures  Thank you for allowing pharmacy to be a part of this patients care.  Isac Sarna, BS [place], [place] Clinical Pharmacist Pager [phone] 04/08/2021 12:26 PM

## 2021-04-08 NOTE — H&P (Addendum)
History and Physical:    [name]   YQI:[id number] DOB: 1955/08/21 DOA: 04/08/2021  Referring MD/provider: Octaviano Glow, MD PCP: Celene Squibb, MD   Patient coming from: Home  Chief Complaint: Shortness of breath  History of Present Illness:   [name] is a 66 y.o. female with medical history significant for type II DM, hypertension, hyperlipidemia, retinopathy, arthritis, who presented to the hospital with cough, fever and shortness of breath.  She said she started coughing about 5 days ago and she developed a fever about 3 days ago and broke out in sweats.  She also started feeling short of breath about 3 days ago but this has progressively worsened.  She drank Gatorade the day before admission and experienced significant heartburn and subsequently vomited.  She said she had not taken her diabetic pills for the past few days because of poor oral intake.  No chest pain, diarrhea, headache, dizziness, syncope or urinary symptoms.  ED Course:  The patient was given empiric antibiotics and IV fluids for pneumonia  ROS:   ROS all other systems reviewed were negative  Past Medical History:   Past Medical History:  Diagnosis Date   Arthritis    OSTEO   Diabetes mellitus without complication ([place])    TYPE 2   Family history of adverse reaction to anesthesia    Mother - N/V   Goiter    Hyperlipidemia    Hypertension    PONV (postoperative nausea and vomiting)    Retinopathy     Past Surgical History:   Past Surgical History:  Procedure Laterality Date   ABDOMINAL AORTOGRAM W/LOWER EXTREMITY N/A 03/31/2020   Procedure: ABDOMINAL AORTOGRAM W/LOWER EXTREMITY;  Surgeon: Cherre Robins, MD;  Location: [facility];  Service: Cardiovascular;  Laterality: N/A;   ABDOMINAL HYSTERECTOMY     AIR/FLUID EXCHANGE Left 11/30/2014   Procedure: AIR/FLUID EXCHANGE LEFT EYE;  Surgeon: Hurman Horn, MD;  Location: [facility];  Service: Ophthalmology;  Laterality: Left;    AMPUTATION Left 06/11/2020   Procedure: LEFT GREAT TOE AMPUTATION;  Surgeon: Newt Minion, MD;  Location: [facility];  Service: Orthopedics;  Laterality: Left;   BACK SURGERY     CESAREAN SECTION     x 2   EYE SURGERY     INJECTION OF SILICONE OIL Right 3/87/5643   Procedure: INJECTION OF SILICONE OIL;  Surgeon: Hurman Horn, MD;  Location: [facility];  Service: Ophthalmology;  Laterality: Right;   INJECTION OF SILICONE OIL Left 06/30/5186   Procedure: INJECTION OF SILICONE OIL;  Surgeon: Hurman Horn, MD;  Location: [facility];  Service: Ophthalmology;  Laterality: Left;   LASER PHOTO ABLATION Right 05/26/2014   Procedure: LASER PHOTO ABLATION;  Surgeon: Hurman Horn, MD;  Location: [facility];  Service: Ophthalmology;  Laterality: Right;   PARS PLANA VITRECTOMY Right 05/26/2014   Procedure: PARS PLANA VITRECTOMY 25 GAUGE;  Surgeon: Hurman Horn, MD;  Location: [facility];  Service: Ophthalmology;  Laterality: Right;   PARS PLANA VITRECTOMY Left 11/30/2014   Procedure: PARS PLANA VITRECTOMY WITH 25 GAUGE with endolaser;  Surgeon: Hurman Horn, MD;  Location: [facility];  Service: Ophthalmology;  Laterality: Left;   PERIPHERAL VASCULAR INTERVENTION Left 03/31/2020   Procedure: PERIPHERAL VASCULAR INTERVENTION;  Surgeon: Cherre Robins, MD;  Location: [facility];  Service: Cardiovascular;  Laterality: Left;  superficial femoral   TONSILLECTOMY     TOTAL HIP ARTHROPLASTY Right 04/21/2014  dr Percell Miller   TOTAL HIP ARTHROPLASTY Right 04/21/2014   Procedure: RIGHT TOTAL HIP ARTHROPLASTY ANTERIOR APPROACH;  Surgeon: Renette Butters, MD;  Location: [facility];  Service: Orthopedics;  Laterality: Right;    Social History:   Social History   Socioeconomic History   Marital status: Married    Spouse name: Not on file   Number of children: Not on file   Years of education: Not on file   Highest education level: Not on file  Occupational History   Not on file  Tobacco Use   Smoking status: Former   Smokeless  tobacco: Never   Tobacco comments:    smoked as a teenager  Media planner   Vaping Use: Never used  Substance and Sexual Activity   Alcohol use: No   Drug use: No   Sexual activity: Not on file  Other Topics Concern   Not on file  Social History Narrative   Not on file   Social Determinants of Health   Financial Resource Strain: Not on file  Food Insecurity: Not on file  Transportation Needs: Not on file  Physical Activity: Not on file  Stress: Not on file  Social Connections: Not on file  Intimate Partner Violence: Not on file    Allergies   Anesthetics, amide and Codeine  Family history:   Family History  Problem Relation Age of Onset   Healthy Mother    Healthy Father     Current Medications:   Prior to Admission medications   Medication Sig Start Date End Date Taking? Authorizing Provider  acidophilus (RISAQUAD) CAPS capsule Take 1 capsule by mouth daily.   Yes [provider]  APPLE CIDER VINEGAR PO Take 4 tablets by mouth daily.   Yes [provider]  glipiZIDE (GLUCOTROL) 10 MG tablet Take 10 mg by mouth 2 (two) times daily. 03/01/20  Yes [provider]  Glucosamine-Chondroitin (OSTEO BI-FLEX REGULAR STRENGTH PO) Take 2 tablets by mouth daily.   Yes [provider]  lisinopril-hydrochlorothiazide (ZESTORETIC) 20-12.5 MG tablet Take by mouth daily.   Yes [provider]  Multiple Vitamins-Minerals (MULTIVITAMIN PO) Take 1 tablet by mouth daily.   Yes [provider]  Multiple Vitamins-Minerals (OCUVITE ADULT 50+ PO) Take 1 tablet by mouth daily.   Yes [provider]  NEXLETOL 180 MG TABS Take 1 tablet by mouth daily. 02/08/21  Yes [provider]  SYNJARDY XR 01-999 MG TB24 Take 2 tablets by mouth daily. 04/06/21  Yes [provider]  doxycycline (VIBRA-TABS) 100 MG tablet Take 1 tablet (100 mg total) by mouth 2 (two) times daily. Patient not taking: Reported on 04/08/2021 06/05/20    Kathie Dike, MD  HYDROcodone-acetaminophen (NORCO/VICODIN) 5-325 MG tablet Take 1 tablet by mouth every 4 (four) hours as needed for moderate pain. Patient not taking: Reported on 04/08/2021 06/11/20   Persons, Bevely Palmer, [place]    Physical Exam:   Vitals:   04/08/21 0752 04/08/21 0753 04/08/21 1030  BP: 130/83  123/65  Pulse: (!) 132  (!) 128  Resp: 20  (!) 31  Temp: 97.7 F (36.5 C)    TempSrc: Oral    SpO2: 99%  99%  Weight: 81.6 kg 81.6 kg   Height: 5\' 6"  (1.676 m) 5\' 6"  (1.676 m)      Physical Exam: Blood pressure 123/65, pulse (!) 128, temperature 97.7 F (36.5 C), temperature source Oral, resp. rate (!) 31, height 5\' 6"  (1.676 m), weight 81.6 kg, SpO2 99 %.  Gen: No acute distress. Head: Normocephalic, atraumatic. Eyes: Pupils equal, round and reactive to light. Extraocular movements intact.  Sclerae nonicteric.  Mouth: Dry mucous membranes Neck: Supple, no thyromegaly, no lymphadenopathy, no jugular venous distention. Chest: Rhonchi heard on the right side of the lungs.  No rales heard. CV: Heart sounds are regular with an S1, S2, tachycardic. No murmurs, rubs or gallops heard.  Abdomen: Soft, nontender, obese with normal active bowel sounds. No palpable masses. Extremities: Extremities are without clubbing, or cyanosis. No edema.  Left big toe amputee Skin: Warm and dry. No rashes Neuro: Alert and oriented times 3; grossly nonfocal.  Psych: Insight is good and judgment is appropriate. Mood and affect normal.   Data Review:    Labs: Basic Metabolic Panel: Recent Labs  Lab 04/08/21 0818 04/08/21 1124 04/08/21 1203  NA 134* 132* 136  K 4.7 4.1 3.6  CL 97* 98 101  CO2 7* <7* <7*  GLUCOSE 327* 293* 266*  BUN 29* 29* 29*  CREATININE 1.48* 1.37* 1.34*  CALCIUM 10.0 8.8* 9.3  MG 2.6*  --   --    Liver Function Tests: No results for input(s): AST, ALT, ALKPHOS, BILITOT, PROT, ALBUMIN in the last 168 hours. No results for input(s): LIPASE, AMYLASE in the last  168 hours. No results for input(s): AMMONIA in the last 168 hours. CBC: Recent Labs  Lab 04/08/21 0818  WBC 28.9*  NEUTROABS 25.7*  HGB 15.2*  HCT 49.7*  MCV 97.6  PLT 438*   Cardiac Enzymes: No results for input(s): CKTOTAL, CKMB, CKMBINDEX, TROPONINI in the last 168 hours.  BNP (last 3 results) No results for input(s): PROBNP in the last 8760 hours. CBG: Recent Labs  Lab 04/08/21 1243 04/08/21 1352  GLUCAP 264* 232*    Urinalysis    Component Value Date/Time   COLORURINE YELLOW 04/10/2014 1332   APPEARANCEUR HAZY (A) 04/10/2014 1332   LABSPEC 1.019 04/10/2014 1332   PHURINE 5.5 04/10/2014 1332   GLUCOSEU NEGATIVE 04/10/2014 1332   HGBUR NEGATIVE 04/10/2014 1332   BILIRUBINUR NEGATIVE 04/10/2014 1332   KETONESUR 15 (A) 04/10/2014 1332   PROTEINUR NEGATIVE 04/10/2014 1332   UROBILINOGEN 0.2 04/10/2014 1332   NITRITE POSITIVE (A) 04/10/2014 1332   LEUKOCYTESUR SMALL (A) 04/10/2014 1332      Radiographic Studies: CT Angio Chest PE W and/or Wo Contrast  Result Date: 04/08/2021 CLINICAL DATA:  Pulmonary embolism (PE) suspected, high prob EXAM: CT ANGIOGRAPHY CHEST WITH CONTRAST TECHNIQUE: Multidetector CT imaging of the chest was performed using the standard protocol during bolus administration of intravenous contrast. Multiplanar CT image reconstructions and MIPs were obtained to evaluate the vascular anatomy. CONTRAST:  24mL OMNIPAQUE IOHEXOL 350 MG/ML SOLN COMPARISON:  Chest XR, concurrent. FINDINGS: Suboptimal evaluation, secondary to motion degradation. Cardiovascular: Degraded evaluation such that subsegmental and small segmental pulmonary emboli could missed. No segmental or larger pulmonary embolus. No pulmonary arterial dilation. Normal heart size. No pericardial effusion. A moderate burden of multivessel coronary atherosclerosis is present. Mediastinum/Nodes: No enlarged mediastinal, hilar, or axillary lymph nodes. Marked thyromegaly, involving the LEFT thyroid  gland and extending outside the field of view, measuring approximally 4.5 x 6.5 cm. Layering fluid with air-and-fluid level within the esophagus. The trachea demonstrates no significant findings. Lungs/Pleura: LEFT posterior basilar consolidation. No pleural effusion or pneumothorax. Upper Abdomen: Nodular thickening of the LEFT adrenal gland without discrete lesion mild fluid distention of stomach. Musculoskeletal: No chest wall abnormality. No acute or significant osseous findings. Review of the MIP images confirms  the above findings. IMPRESSION: Suboptimal evaluation, within these constraints; 1. No segmental or larger pulmonary embolus. 2. LEFT lower lobe consolidation. Findings highly suspicious for aspiration pneumonia/pneumonitis given fluid-distended stomach and esophagus. 3. Goiter with marked enlargement of the LEFT thyroid gland, incompletely assessed. Recommend non-emergent, outpatient US Thyroid if not already performed. Electronically Signed   By: Michaelle Birks M.D.   On: 04/08/2021 10:24   DG Chest Portable 1 View  Result Date: 04/08/2021 CLINICAL DATA:  Shortness of breath, likely positive COVID. Shortness of breath and fever and cough which started Sunday. EXAM: PORTABLE CHEST 1 VIEW COMPARISON:  Chest radiograph dated April 20, 2019 FINDINGS: The heart is enlarged. Atherosclerotic calcification of aortic arch. Low lung volumes. No focal consolidation or large pleural effusion. IMPRESSION: 1. Stable cardiomegaly. 2. Low lung volumes without focal consolidation or pleural effusion. Electronically Signed   By: Keane Police D.O.   On: 04/08/2021 08:34    EKG: Independently reviewed by me.  Sinus tachycardia   Assessment/Plan:   Principal Problem:   Aspiration pneumonia (HCC) Active Problems:   DKA, type 2 ([place])   Sepsis ([place])   AKI (acute kidney injury) ([place])    Body mass index is 29.05 kg/m.  Severe sepsis secondary to pneumonia, probably aspiration pneumonia, leukocytosis:  Admit to stepdown unit.  Treat with IV fluids and empiric IV antibiotics.  Follow-up blood cultures.  DKA: Start IV insulin and IV fluids per DKA protocol.  Monitor glucose levels closely.  Hold glipizide and Synjardy.  AKI: Hydrate with IV fluids.  Monitor BMP.  Hold lisinopril  Elevated troponins: No chest pain.  This is likely from demand ischemia.  Other information:   DVT prophylaxis: enoxaparin (LOVENOX) injection 40 mg Start: 04/08/21 1130  Code Status: Full code. Family Communication: None Disposition Plan: Possible discharge to home in 2 to 3 days Consults called: None Admission status: Inpatient  The medical decision making on this patient was of high complexity and the patient is at high risk for clinical deterioration, therefore this is a level 3 visit.    Amitai Delaughter [facility] Pager: Please check [email or web address]   How to contact the [facility] Attending or Consulting provider [place] or covering provider during after hours [place], for this patient?   Check the care team in [facility] and look for a) attending/consulting [facility] provider listed and b) the [facility] team listed Log into [email or web address] and use [facility]'s universal password to access. If you do not have the password, please contact the hospital operator. Locate the [facility] provider you are looking for under [facility] and page to a number that you can be directly reached. If you still have difficulty reaching the provider, please page the [facility] (Director on Call) for the Hospitalists listed on amion for assistance.  04/08/2021, 1:54 PM

## 2021-04-09 DIAGNOSIS — I471 Supraventricular tachycardia, unspecified: Secondary | ICD-10-CM | POA: Diagnosis present

## 2021-04-09 DIAGNOSIS — L8990 Pressure ulcer of unspecified site, unspecified stage: Secondary | ICD-10-CM | POA: Insufficient documentation

## 2021-04-09 LAB — GLUCOSE, CAPILLARY
Glucose-Capillary: 138 mg/dL — ABNORMAL HIGH (ref 70–99)
Glucose-Capillary: 140 mg/dL — ABNORMAL HIGH (ref 70–99)
Glucose-Capillary: 147 mg/dL — ABNORMAL HIGH (ref 70–99)
Glucose-Capillary: 148 mg/dL — ABNORMAL HIGH (ref 70–99)
Glucose-Capillary: 150 mg/dL — ABNORMAL HIGH (ref 70–99)
Glucose-Capillary: 156 mg/dL — ABNORMAL HIGH (ref 70–99)
Glucose-Capillary: 161 mg/dL — ABNORMAL HIGH (ref 70–99)
Glucose-Capillary: 164 mg/dL — ABNORMAL HIGH (ref 70–99)
Glucose-Capillary: 167 mg/dL — ABNORMAL HIGH (ref 70–99)
Glucose-Capillary: 168 mg/dL — ABNORMAL HIGH (ref 70–99)
Glucose-Capillary: 170 mg/dL — ABNORMAL HIGH (ref 70–99)
Glucose-Capillary: 191 mg/dL — ABNORMAL HIGH (ref 70–99)
Glucose-Capillary: 199 mg/dL — ABNORMAL HIGH (ref 70–99)

## 2021-04-09 LAB — BASIC METABOLIC PANEL
Anion gap: 16 — ABNORMAL HIGH (ref 5–15)
BUN: 13 mg/dL (ref 8–23)
CO2: 13 mmol/L — ABNORMAL LOW (ref 22–32)
Calcium: 8.7 mg/dL — ABNORMAL LOW (ref 8.9–10.3)
Chloride: 113 mmol/L — ABNORMAL HIGH (ref 98–111)
Creatinine, Ser: 0.77 mg/dL (ref 0.44–1.00)
GFR, Estimated: >60 mL/min (ref >60)
Glucose, Bld: 190 mg/dL — ABNORMAL HIGH (ref 70–99)
Potassium: 3.6 mmol/L (ref 3.5–5.1)
Sodium: 142 mmol/L (ref 135–145)

## 2021-04-09 LAB — BASIC METABOLIC PANEL WITH GFR
Anion gap: 16 — ABNORMAL HIGH (ref 5–15)
Anion gap: 13 (ref 5–15)
BUN: 19 mg/dL (ref 8–23)
BUN: 17 mg/dL (ref 8–23)
CO2: 12 mmol/L — ABNORMAL LOW (ref 22–32)
CO2: 12 mmol/L — ABNORMAL LOW (ref 22–32)
Calcium: 8.8 mg/dL — ABNORMAL LOW (ref 8.9–10.3)
Calcium: 8.9 mg/dL (ref 8.9–10.3)
Chloride: 112 mmol/L — ABNORMAL HIGH (ref 98–111)
Chloride: 116 mmol/L — ABNORMAL HIGH (ref 98–111)
Creatinine, Ser: 0.89 mg/dL (ref 0.44–1.00)
Creatinine, Ser: 0.78 mg/dL (ref 0.44–1.00)
GFR, Estimated: >60 mL/min
GFR, Estimated: >60 mL/min
Glucose, Bld: 151 mg/dL — ABNORMAL HIGH (ref 70–99)
Glucose, Bld: 169 mg/dL — ABNORMAL HIGH (ref 70–99)
Potassium: 3.1 mmol/L — ABNORMAL LOW (ref 3.5–5.1)
Potassium: 2.8 mmol/L — ABNORMAL LOW (ref 3.5–5.1)
Sodium: 140 mmol/L (ref 135–145)
Sodium: 141 mmol/L (ref 135–145)

## 2021-04-09 LAB — CBC WITH DIFFERENTIAL/PLATELET
Band Neutrophils: 7 %
Basophils Absolute: 0 K/uL (ref 0.0–0.1)
Basophils Relative: 0 %
Eosinophils Absolute: 0 K/uL (ref 0.0–0.5)
Eosinophils Relative: 0 %
HCT: 38.8 % (ref 36.0–46.0)
Hemoglobin: 12.7 g/dL (ref 12.0–15.0)
Lymphocytes Relative: 4 %
Lymphs Abs: 0.8 K/uL (ref 0.7–4.0)
MCH: 29.7 pg (ref 26.0–34.0)
MCHC: 32.7 g/dL (ref 30.0–36.0)
MCV: 90.9 fL (ref 80.0–100.0)
Monocytes Absolute: 1 K/uL (ref 0.1–1.0)
Monocytes Relative: 5 %
Neutro Abs: 18.4 K/uL — ABNORMAL HIGH (ref 1.7–7.7)
Neutrophils Relative %: 84 %
Platelets: 334 K/uL (ref 150–400)
RBC: 4.27 MIL/uL (ref 3.87–5.11)
RDW: 14.1 % (ref 11.5–15.5)
WBC: 20.2 K/uL — ABNORMAL HIGH (ref 4.0–10.5)
nRBC: 0 % (ref 0.0–0.2)

## 2021-04-09 LAB — TROPONIN I (HIGH SENSITIVITY): Troponin I (High Sensitivity): 785 ng/L (ref <18)

## 2021-04-09 LAB — MAGNESIUM: Magnesium: 2 mg/dL (ref 1.7–2.4)

## 2021-04-09 LAB — PHOSPHORUS: Phosphorus: 1.1 mg/dL — ABNORMAL LOW (ref 2.5–4.6)

## 2021-04-09 LAB — LACTIC ACID, PLASMA: Lactic Acid, Venous: 1 mmol/L (ref 0.5–1.9)

## 2021-04-09 LAB — BETA-HYDROXYBUTYRIC ACID: Beta-Hydroxybutyric Acid: 5.21 mmol/L — ABNORMAL HIGH (ref 0.05–0.27)

## 2021-04-09 MED ORDER — ADENOSINE 6 MG/2ML IV SOLN
INTRAVENOUS | Status: AC
  Administered 2021-04-09: 6 mg
  Filled 2021-04-09: qty 6

## 2021-04-09 MED ORDER — ADENOSINE 6 MG/2ML IV SOLN: 6.0000 mg | Freq: Once | INTRAVENOUS | Status: AC

## 2021-04-09 MED ORDER — METOPROLOL TARTRATE 25 MG PO TABS
25.0000 mg | ORAL_TABLET | Freq: Four times a day (QID) | ORAL | Status: DC
  Administered 2021-04-09 – 2021-04-10 (×4): 25 mg via ORAL
  Filled 2021-04-09 (×4): qty 1

## 2021-04-09 MED ORDER — INSULIN GLARGINE-YFGN 100 UNIT/ML ~~LOC~~ SOLN
12.0000 [IU] | Freq: Every day | SUBCUTANEOUS | Status: DC
  Administered 2021-04-09 – 2021-04-10 (×2): 12 [IU] via SUBCUTANEOUS
  Filled 2021-04-09 (×4): qty 0.12

## 2021-04-09 MED ORDER — POTASSIUM CHLORIDE CRYS ER 20 MEQ PO TBCR
40.0000 meq | EXTENDED_RELEASE_TABLET | ORAL | Status: AC
  Administered 2021-04-09 (×2): 40 meq via ORAL
  Filled 2021-04-09 (×2): qty 2

## 2021-04-09 MED ORDER — POTASSIUM PHOSPHATES 15 MMOLE/5ML IV SOLN
30.0000 mmol | Freq: Once | INTRAVENOUS | Status: AC
  Administered 2021-04-09: 30 mmol via INTRAVENOUS
  Filled 2021-04-09: qty 10

## 2021-04-09 NOTE — Progress Notes (Addendum)
Progress Note    [name]  YTK:[id number] DOB: Jun 03, 1955  DOA: 04/08/2021 PCP: Celene Squibb, MD      Brief Narrative:    Medical records reviewed and are as summarized below:  [name] is a 66 y.o. female  with medical history significant for type II DM, hypertension, hyperlipidemia, retinopathy, arthritis, who presented to the hospital with cough, fever and shortness of breath.  She had been ill for a period of about 5 days.  She has not taken her diabetic pills because of poor oral intake.      Assessment/Plan:   Principal Problem:   Aspiration pneumonia (HCC) Active Problems:   DKA, type 2 (HCC)   Sepsis ([place])   AKI (acute kidney injury) ([place])   Pressure injury of skin   SVT (supraventricular tachycardia) (HCC)    Body mass index is 27.72 kg/m.   Severe sepsis secondary to pneumonia, probably aspiration pneumonia, leukocytosis: Continue empiric IV antibiotics.  Follow-up blood cultures.  DKA, type II DM: Taper off insulin infusion.  Start insulin glargine at 15 units daily.  Use NovoLog as needed for hyperglycemia.    SVT: Heart rate was up to 196.  No chest pain, palpitation, lightheadedness.  She was given IV adenosine push and heart rate improved into the 110s to 120s.  Start oral metoprolol for rate control.  Hypokalemia and hypophosphatemia: Replete potassium and phosphorus intravenously and with oral potassium chloride.  Monitor levels.  AKI: Improved.  Lisinopril has been held.  Elevated troponins: No chest pain.  This is likely from demand ischemia.   CRITICAL CARE Performed by: Jennye Boroughs   Total critical care time: 37 minutes   Critical care time was exclusive of separately billable procedures and treating other patients.  Critical care was necessary to treat or prevent imminent or life-threatening deterioration.  Critical care was time spent personally by me on the following activities: development of treatment plan  with patient and/or surrogate as well as nursing, discussions with consultants, evaluation of patient's response to treatment, examination of patient, obtaining history from patient or surrogate, ordering and performing treatments and interventions, ordering and review of laboratory studies, ordering and review of radiographic studies, pulse oximetry and re-evaluation of patient's condition. care  Diet Order             Diet Carb Modified Fluid consistency: Thin; Room service appropriate? Yes  Diet effective now                      Consultants: None  Procedures: None    Medications:    Chlorhexidine Gluconate Cloth  6 each Topical Q0600   enoxaparin (LOVENOX) injection  40 mg Subcutaneous Q24H   insulin glargine-yfgn  12 Units Subcutaneous Daily   metoprolol tartrate  25 mg Oral Q6H   Continuous Infusions:  ampicillin-sulbactam (UNASYN) IV 3 g (04/09/21 1434)   azithromycin 500 mg (04/09/21 1100)   potassium PHOSPHATE IVPB (in mmol) 30 mmol (04/09/21 1537)     Anti-infectives (From admission, onward)    Start     Dose/Rate Route Frequency Ordered Stop   04/09/21 0900  azithromycin (ZITHROMAX) 500 mg in sodium chloride 0.9 % 250 mL IVPB  Status:  Discontinued        500 mg 250 mL/hr over 60 Minutes Intravenous Every 24 hours 04/08/21 1119 04/08/21 1121   04/08/21 1400  Ampicillin-Sulbactam (UNASYN) 3 g in sodium chloride 0.9 % 100 mL IVPB  3 g 200 mL/hr over 30 Minutes Intravenous Every 8 hours 04/08/21 1232     04/08/21 1130  cefTRIAXone (ROCEPHIN) 2 g in sodium chloride 0.9 % 100 mL IVPB  Status:  Discontinued        2 g 200 mL/hr over 30 Minutes Intravenous Every 24 hours 04/08/21 1126 04/08/21 1126   04/08/21 1121  azithromycin (ZITHROMAX) 500 mg in sodium chloride 0.9 % 250 mL IVPB        500 mg 250 mL/hr over 60 Minutes Intravenous Every 24 hours 04/08/21 1121     04/08/21 1045  cefTRIAXone (ROCEPHIN) 1 g in sodium chloride 0.9 % 100 mL IVPB        1  g 200 mL/hr over 30 Minutes Intravenous  Once 04/08/21 1031 04/08/21 1528   04/08/21 1045  doxycycline (VIBRA-TABS) tablet 100 mg        100 mg Oral  Once 04/08/21 1031 04/08/21 1248              Family Communication/Anticipated D/C date and plan/Code Status   DVT prophylaxis: enoxaparin (LOVENOX) injection 40 mg Start: 04/08/21 1130     Code Status: Full Code  Family Communication: None Disposition Plan: Plan to discharge home in 2 to 3 days   Status is: Inpatient  Remains inpatient appropriate because: IV antibiotics           Subjective:   Interval events noted.  She complains of dry mouth.  She went into SVT this afternoon.  Heart rate was up to 196.  Objective:    Vitals:   04/09/21 1500 04/09/21 1600 04/09/21 1622 04/09/21 1700  BP: (!) 122/58 127/76  (!) 149/86  Pulse: (!) 130 (!) 116  (!) 113  Resp: (!) 29 (!) 23  (!) 24  Temp:   98.4 F (36.9 C)   TempSrc:   Axillary   SpO2: 99% 98%  99%  Weight:      Height:       No data found.   Intake/Output Summary (Last 24 hours) at 04/09/2021 1703 Last data filed at 04/09/2021 1500 Gross per 24 hour  Intake 2150.47 ml  Output 2050 ml  Net 100.47 ml   Filed Weights   04/08/21 0752 04/08/21 0753 04/08/21 1612  Weight: 81.6 kg 81.6 kg 77.9 kg    Exam:  GEN: NAD SKIN: Warm and dry EYES: No pallor or icterus ENT: MMM, goiter CV: RRR PULM: CTA B ABD: soft, ND, NT, +BS CNS: AAO x 3, non focal EXT: No edema or tenderness    Pressure Injury 04/08/21 Buttocks Medial Stage 1 -  Intact skin with non-blanchable redness of a localized area usually over a bony prominence. small nickel sized area of non blanchable redness at the top of gluteal fold (Active)  04/08/21 1945  Location: Buttocks  Location Orientation: Medial  Staging: Stage 1 -  Intact skin with non-blanchable redness of a localized area usually over a bony prominence.  Wound Description (Comments): small nickel sized area of non  blanchable redness at the top of gluteal fold  Present on Admission: Yes     Data Reviewed:   I have personally reviewed following labs and imaging studies:  Labs: Labs show the following:   Basic Metabolic Panel: Recent Labs  Lab 04/08/21 0818 04/08/21 1124 04/08/21 1548 04/08/21 1910 04/08/21 2329 04/09/21 0404 04/09/21 1524  NA 134*   < > 138 139 140 141 142  K 4.7   < > 4.1 3.5 3.1*  2.8* 3.6  CL 97*   < > 107 109 112* 116* 113*  CO2 7*   < > 7* 7* 12* 12* 13*  GLUCOSE 327*   < > 165* 152* 151* 169* 190*  BUN 29*   < > 28* 25* 19 17 13   CREATININE 1.48*   < > 1.12* 1.10* 0.89 0.78 0.77  CALCIUM 10.0   < > 8.9 9.1 8.8* 8.9 8.7*  MG 2.6*  --   --   --   --  2.0  --   PHOS  --   --   --   --   --  1.1*  --    < > = values in this interval not displayed.   GFR Estimated Creatinine Clearance: 72.8 mL/min (by C-G formula based on SCr of 0.77 mg/dL). Liver Function Tests: No results for input(s): AST, ALT, ALKPHOS, BILITOT, PROT, ALBUMIN in the last 168 hours. No results for input(s): LIPASE, AMYLASE in the last 168 hours. No results for input(s): AMMONIA in the last 168 hours. Coagulation profile No results for input(s): INR, PROTIME in the last 168 hours.  CBC: Recent Labs  Lab 04/08/21 0818 04/09/21 0404  WBC 28.9* 20.2*  NEUTROABS 25.7* 18.4*  HGB 15.2* 12.7  HCT 49.7* 38.8  MCV 97.6 90.9  PLT 438* 334   Cardiac Enzymes: No results for input(s): CKTOTAL, CKMB, CKMBINDEX, TROPONINI in the last 168 hours. BNP (last 3 results) No results for input(s): PROBNP in the last 8760 hours. CBG: Recent Labs  Lab 04/09/21 0609 04/09/21 0704 04/09/21 0843 04/09/21 1142 04/09/21 1623  GLUCAP 168* 170* 191* 147* 161*   D-Dimer: No results for input(s): DDIMER in the last 72 hours. Hgb A1c: Recent Labs    04/08/21 0818  HGBA1C 7.4*   Lipid Profile: No results for input(s): CHOL, HDL, LDLCALC, TRIG, CHOLHDL, LDLDIRECT in the last 72 hours. Thyroid function  studies: No results for input(s): TSH, T4TOTAL, T3FREE, THYROIDAB in the last 72 hours.  Invalid input(s): FREET3 Anemia work up: No results for input(s): VITAMINB12, FOLATE, FERRITIN, TIBC, IRON, RETICCTPCT in the last 72 hours. Sepsis Labs: Recent Labs  Lab 04/08/21 0818 04/09/21 0404 04/09/21 1524  WBC 28.9* 20.2*  --   LATICACIDVEN  --   --  1.0    Microbiology Recent Results (from the past 240 hour(s))  Resp Panel by RT-PCR (Flu A&B, Covid) Nasopharyngeal Swab     Status: None   Collection Time: 04/08/21  8:07 AM   Specimen: Nasopharyngeal Swab; Nasopharyngeal(NP) swabs in vial transport medium  Result Value Ref Range Status   SARS Coronavirus 2 by RT PCR NEGATIVE NEGATIVE Final    Comment: (NOTE) SARS-CoV-2 target nucleic acids are NOT DETECTED.  The SARS-CoV-2 RNA is generally detectable in upper respiratory specimens during the acute phase of infection. The lowest concentration of SARS-CoV-2 viral copies this assay can detect is 138 copies/mL. A negative result does not preclude SARS-Cov-2 infection and should not be used as the sole basis for treatment or other patient management decisions. A negative result may occur with  improper specimen collection/handling, submission of specimen other than nasopharyngeal swab, presence of viral mutation(s) within the areas targeted by this assay, and inadequate number of viral copies(<138 copies/mL). A negative result must be combined with clinical observations, patient history, and epidemiological information. The expected result is Negative.  Fact Sheet for Patients:  EntrepreneurPulse.com.au  Fact Sheet for Healthcare Providers:  IncredibleEmployment.be  This test is no t yet approved or  cleared by the [place] and  has been authorized for detection and/or diagnosis of SARS-CoV-2 by FDA under an Emergency Use Authorization (EUA). This EUA will remain  in effect (meaning this  test can be used) for the duration of the COVID-19 declaration under Section 564(b)(1) of the Act, 21 U.S.C.section 360bbb-3(b)(1), unless the authorization is terminated  or revoked sooner.       Influenza A by PCR NEGATIVE NEGATIVE Final   Influenza B by PCR NEGATIVE NEGATIVE Final    Comment: (NOTE) The Xpert Xpress SARS-CoV-2/FLU/RSV plus assay is intended as an aid in the diagnosis of influenza from Nasopharyngeal swab specimens and should not be used as a sole basis for treatment. Nasal washings and aspirates are unacceptable for Xpert Xpress SARS-CoV-2/FLU/RSV testing.  Fact Sheet for Patients: EntrepreneurPulse.com.au  Fact Sheet for Healthcare Providers: IncredibleEmployment.be  This test is not yet approved or cleared by the [place] FDA and has been authorized for detection and/or diagnosis of SARS-CoV-2 by FDA under an Emergency Use Authorization (EUA). This EUA will remain in effect (meaning this test can be used) for the duration of the COVID-19 declaration under Section 564(b)(1) of the Act, 21 U.S.C. section 360bbb-3(b)(1), unless the authorization is terminated or revoked.  Performed at [facility], [address], [place], [place] [address]   MRSA Next Gen by PCR, Nasal     Status: None   Collection Time: 04/08/21  4:14 PM   Specimen: Nasal Mucosa; Nasal Swab  Result Value Ref Range Status   MRSA by PCR Next Gen NOT DETECTED NOT DETECTED Final    Comment: (NOTE) The GeneXpert MRSA Assay (FDA approved for NASAL specimens only), is one component of a comprehensive MRSA colonization surveillance program. It is not intended to diagnose MRSA infection nor to guide or monitor treatment for MRSA infections. Test performance is not FDA approved in patients less than 9 years old. Performed at [facility], [address], [place], [place] [address]     Procedures and diagnostic studies:  CT Angio Chest PE W and/or Wo  Contrast  Result Date: 04/08/2021 CLINICAL DATA:  Pulmonary embolism (PE) suspected, high prob EXAM: CT ANGIOGRAPHY CHEST WITH CONTRAST TECHNIQUE: Multidetector CT imaging of the chest was performed using the standard protocol during bolus administration of intravenous contrast. Multiplanar CT image reconstructions and MIPs were obtained to evaluate the vascular anatomy. CONTRAST:  65mL OMNIPAQUE IOHEXOL 350 MG/ML SOLN COMPARISON:  Chest XR, concurrent. FINDINGS: Suboptimal evaluation, secondary to motion degradation. Cardiovascular: Degraded evaluation such that subsegmental and small segmental pulmonary emboli could missed. No segmental or larger pulmonary embolus. No pulmonary arterial dilation. Normal heart size. No pericardial effusion. A moderate burden of multivessel coronary atherosclerosis is present. Mediastinum/Nodes: No enlarged mediastinal, hilar, or axillary lymph nodes. Marked thyromegaly, involving the LEFT thyroid gland and extending outside the field of view, measuring approximally 4.5 x 6.5 cm. Layering fluid with air-and-fluid level within the esophagus. The trachea demonstrates no significant findings. Lungs/Pleura: LEFT posterior basilar consolidation. No pleural effusion or pneumothorax. Upper Abdomen: Nodular thickening of the LEFT adrenal gland without discrete lesion mild fluid distention of stomach. Musculoskeletal: No chest wall abnormality. No acute or significant osseous findings. Review of the MIP images confirms the above findings. IMPRESSION: Suboptimal evaluation, within these constraints; 1. No segmental or larger pulmonary embolus. 2. LEFT lower lobe consolidation. Findings highly suspicious for aspiration pneumonia/pneumonitis given fluid-distended stomach and esophagus. 3. Goiter with marked enlargement of the LEFT thyroid gland, incompletely assessed. Recommend non-emergent, outpatient  US Thyroid if not already performed. Electronically Signed   By: Michaelle Birks M.D.   On:  04/08/2021 10:24   DG Chest Portable 1 View  Result Date: 04/08/2021 CLINICAL DATA:  Shortness of breath, likely positive COVID. Shortness of breath and fever and cough which started Sunday. EXAM: PORTABLE CHEST 1 VIEW COMPARISON:  Chest radiograph dated April 20, 2019 FINDINGS: The heart is enlarged. Atherosclerotic calcification of aortic arch. Low lung volumes. No focal consolidation or large pleural effusion. IMPRESSION: 1. Stable cardiomegaly. 2. Low lung volumes without focal consolidation or pleural effusion. Electronically Signed   By: Keane Police D.O.   On: 04/08/2021 08:34               LOS: 1 day   Tequila Rottmann  [facility]   Pager on [email or web address]. If 7PM-7AM, please contact night-coverage at [email or web address]     04/09/2021, 5:03 PM

## 2021-04-09 NOTE — TOC Initial Note (Signed)
Transition of Care [facility]) - Initial/Assessment Note    Patient Details  Name: [name] MRN: [id number] Date of Birth: October 26, 1955  Transition of Care [facility]) CM/SW Contact:    Kerin Salen, RN Phone Number: 04/09/2021, 4:34 PM  Clinical Narrative:   Transition of Care (TOC) Screening Note   Patient Details  Name: [name] Date of Birth: June 29, 1955   Transition of Care [facility]) CM/SW Contact:    Kerin Salen, RN Phone Number: 04/09/2021, 4:34 PM    Transition of Care Department [facility]) has reviewed patient and no TOC needs have been identified at this time. We will continue to monitor patient advancement through interdisciplinary progression rounds. If new patient transition needs arise, please place a TOC consult.                         Patient Goals and CMS Choice        Expected Discharge Plan and Services                                                Prior Living Arrangements/Services                       Activities of Daily Living Home Assistive Devices/Equipment: None ADL Screening (condition at time of admission) Patient's cognitive ability adequate to safely complete daily activities?: Yes Is the patient deaf or have difficulty hearing?: No Does the patient have difficulty seeing, even when wearing glasses/contacts?: No Does the patient have difficulty concentrating, remembering, or making decisions?: No Patient able to express need for assistance with ADLs?: No Does the patient have difficulty dressing or bathing?: No Independently performs ADLs?: Yes (appropriate for developmental age) Does the patient have difficulty walking or climbing stairs?: No Weakness of Legs: None Weakness of Arms/Hands: None  Permission Sought/Granted                  Emotional Assessment              Admission diagnosis:  Aspiration pneumonia ([place]) [J69.0] Diabetic ketoacidosis without coma associated with type 2 diabetes  mellitus ([place]) [E11.10] Community acquired pneumonia of left lower lobe of lung [J18.9] Patient Active Problem List   Diagnosis Date Noted   Pressure injury of skin 04/09/2021   Aspiration pneumonia ([place]) 04/08/2021   DKA, type 2 ([place]) 04/08/2021   Sepsis ([place]) 04/08/2021   AKI (acute kidney injury) ([place]) 04/08/2021   History of vitrectomy 09/14/2020   Stable treated proliferative diabetic retinopathy of right eye determined by examination associated with type 2 diabetes mellitus ([place]) 09/14/2020   Stable treated proliferative diabetic retinopathy of left eye determined by examination associated with type 2 diabetes mellitus ([place]) 09/14/2020   Diabetic ulcer of left great toe ([place])    DM (diabetes mellitus), type 2 with peripheral vascular complications ([place]) 51/05/5850   HLD (hyperlipidemia) 06/03/2020   HTN (hypertension) 06/03/2020   Osteomyelitis of great toe of left foot ([place]) 06/03/2020   Cellulitis of left foot 06/03/2020   Atherosclerosis of native artery of left lower extremity with ulceration ([place]) 03/30/2020   DJD (degenerative joint disease) 04/21/2014   PCP:  Celene Squibb, MD Pharmacy:   [facility], [place] [address] [place] [address] Phone: [phone]  Fax: [facility] [address] [place] [address] Phone: [phone] Fax: [phone]     Social Determinants of Health (SDOH) Interventions    Readmission Risk Interventions No flowsheet data found.

## 2021-04-09 NOTE — Progress Notes (Signed)
Troponin level left SVT came back at 785.  Case was discussed with Dr. Stanford Breed, cardiologist on-call.  He was of the view that this was likely demand ischemia and he recommended continuing current treatment plan.  No new recommendations were made.

## 2021-04-09 NOTE — Progress Notes (Signed)
Called Dr. Mal Misty with critical troponin of 785

## 2021-04-10 DIAGNOSIS — I471 Supraventricular tachycardia: Secondary | ICD-10-CM

## 2021-04-10 LAB — BASIC METABOLIC PANEL
Anion gap: 12 (ref 5–15)
BUN: 15 mg/dL (ref 8–23)
CO2: 17 mmol/L — ABNORMAL LOW (ref 22–32)
Calcium: 8.6 mg/dL — ABNORMAL LOW (ref 8.9–10.3)
Chloride: 111 mmol/L (ref 98–111)
Creatinine, Ser: 0.7 mg/dL (ref 0.44–1.00)
GFR, Estimated: >60 mL/min (ref >60)
Glucose, Bld: 140 mg/dL — ABNORMAL HIGH (ref 70–99)
Potassium: 4.2 mmol/L (ref 3.5–5.1)
Sodium: 140 mmol/L (ref 135–145)

## 2021-04-10 LAB — CBC WITH DIFFERENTIAL/PLATELET
Abs Immature Granulocytes: 0.26 10*3/uL — ABNORMAL HIGH (ref 0.00–0.07)
Basophils Absolute: 0.1 10*3/uL (ref 0.0–0.1)
Basophils Relative: 0 %
Eosinophils Absolute: 0.1 10*3/uL (ref 0.0–0.5)
Eosinophils Relative: 0 %
HCT: 37.4 % (ref 36.0–46.0)
Hemoglobin: 12.5 g/dL (ref 12.0–15.0)
Immature Granulocytes: 2 %
Lymphocytes Relative: 9 %
Lymphs Abs: 1.5 10*3/uL (ref 0.7–4.0)
MCH: 30.4 pg (ref 26.0–34.0)
MCHC: 33.4 g/dL (ref 30.0–36.0)
MCV: 91 fL (ref 80.0–100.0)
Monocytes Absolute: 0.8 10*3/uL (ref 0.1–1.0)
Monocytes Relative: 4 %
Neutro Abs: 15.1 10*3/uL — ABNORMAL HIGH (ref 1.7–7.7)
Neutrophils Relative %: 85 %
Platelets: 356 10*3/uL (ref 150–400)
RBC: 4.11 MIL/uL (ref 3.87–5.11)
RDW: 14.8 % (ref 11.5–15.5)
WBC: 17.8 10*3/uL — ABNORMAL HIGH (ref 4.0–10.5)
nRBC: 0 % (ref 0.0–0.2)

## 2021-04-10 LAB — GLUCOSE, CAPILLARY
Glucose-Capillary: 143 mg/dL — ABNORMAL HIGH (ref 70–99)
Glucose-Capillary: 164 mg/dL — ABNORMAL HIGH (ref 70–99)
Glucose-Capillary: 172 mg/dL — ABNORMAL HIGH (ref 70–99)
Glucose-Capillary: 180 mg/dL — ABNORMAL HIGH (ref 70–99)

## 2021-04-10 LAB — MAGNESIUM: Magnesium: 2 mg/dL (ref 1.7–2.4)

## 2021-04-10 LAB — PHOSPHORUS: Phosphorus: 1.8 mg/dL — ABNORMAL LOW (ref 2.5–4.6)

## 2021-04-10 MED ORDER — K PHOS MONO-SOD PHOS DI & MONO 155-852-130 MG PO TABS
500.0000 mg | ORAL_TABLET | Freq: Three times a day (TID) | ORAL | Status: AC
  Administered 2021-04-10 (×3): 500 mg via ORAL
  Filled 2021-04-10 (×3): qty 2

## 2021-04-10 MED ORDER — SODIUM BICARBONATE 650 MG PO TABS
650.0000 mg | ORAL_TABLET | Freq: Three times a day (TID) | ORAL | Status: AC
  Administered 2021-04-10 (×2): 650 mg via ORAL
  Filled 2021-04-10 (×2): qty 1

## 2021-04-10 MED ORDER — METOPROLOL TARTRATE 25 MG PO TABS
25.0000 mg | ORAL_TABLET | Freq: Two times a day (BID) | ORAL | Status: DC
  Administered 2021-04-10 – 2021-04-11 (×2): 25 mg via ORAL
  Filled 2021-04-10 (×2): qty 1

## 2021-04-10 MED ORDER — INSULIN ASPART 100 UNIT/ML IJ SOLN
0.0000 [IU] | Freq: Three times a day (TID) | INTRAMUSCULAR | Status: DC
  Administered 2021-04-10: 2 [IU] via SUBCUTANEOUS
  Administered 2021-04-11: 3 [IU] via SUBCUTANEOUS

## 2021-04-10 NOTE — Progress Notes (Addendum)
Progress Note    [name]  UKG:[id number] DOB: 10-Jan-1956  DOA: 04/08/2021 PCP: Celene Squibb, MD      Brief Narrative:    Medical records reviewed and are as summarized below:  [name] is a 66 y.o. female  with medical history significant for type II DM, hypertension, hyperlipidemia, retinopathy, arthritis, who presented to the hospital with cough, fever and shortness of breath.  She had been ill for a period of about 5 days.  She had not taken her diabetic pills because of poor oral intake.  She was found to have severe sepsis secondary to pneumonia (probably aspiration pneumonia) and DKA.  She was treated with empiric IV antibiotics, IV fluids and IV insulin infusion per DKA protocol.  She developed SVT with heart rate up to 196 that was treated with IV adenosine.  She was started on metoprolol for rate control.  She also had hypokalemia and hypophosphatemia that were repleted.    Assessment/Plan:   Principal Problem:   Aspiration pneumonia (HCC) Active Problems:   DKA, type 2 (HCC)   Sepsis ([place])   AKI (acute kidney injury) ([place])   Pressure injury of skin   SVT (supraventricular tachycardia) (HCC)    Body mass index is 28.89 kg/m.   Severe sepsis secondary to pneumonia, probably aspiration pneumonia, leukocytosis: Continue IV Unasyn and azithromycin.    DKA, type II DM: DKA has resolved.  Continue insulin glargine and use NovoLog as needed for hyperglycemia.  SVT: Heart rate is better.  Continue metoprolol.  Hypophosphatemia: Replete with potassium phosphate.  Hypokalemia, AKI: Improved  Elevated troponins: No chest pain.  This is likely from demand ischemia.    Generalized weakness: Consult PT.   Stage I coccygeal decubitus ulcer: Present on admission.  Continue local wound care  Transfer patient from stepdown unit to telemetry unit.     Diet Order             Diet Carb Modified Fluid consistency: Thin; Room service appropriate?  Yes  Diet effective now                      Consultants: None  Procedures: None    Medications:    Chlorhexidine Gluconate Cloth  6 each Topical Q0600   enoxaparin (LOVENOX) injection  40 mg Subcutaneous Q24H   insulin aspart  0-9 Units Subcutaneous TID WC   insulin glargine-yfgn  12 Units Subcutaneous Daily   metoprolol tartrate  25 mg Oral BID   phosphorus  500 mg Oral TID   Continuous Infusions:  ampicillin-sulbactam (UNASYN) IV 3 g (04/10/21 0533)   azithromycin 500 mg (04/10/21 1022)     Anti-infectives (From admission, onward)    Start     Dose/Rate Route Frequency Ordered Stop   04/09/21 0900  azithromycin (ZITHROMAX) 500 mg in sodium chloride 0.9 % 250 mL IVPB  Status:  Discontinued        500 mg 250 mL/hr over 60 Minutes Intravenous Every 24 hours 04/08/21 1119 04/08/21 1121   04/08/21 1400  Ampicillin-Sulbactam (UNASYN) 3 g in sodium chloride 0.9 % 100 mL IVPB        3 g 200 mL/hr over 30 Minutes Intravenous Every 8 hours 04/08/21 1232     04/08/21 1130  cefTRIAXone (ROCEPHIN) 2 g in sodium chloride 0.9 % 100 mL IVPB  Status:  Discontinued        2 g 200 mL/hr over 30 Minutes Intravenous Every  24 hours 04/08/21 1126 04/08/21 1126   04/08/21 1121  azithromycin (ZITHROMAX) 500 mg in sodium chloride 0.9 % 250 mL IVPB        500 mg 250 mL/hr over 60 Minutes Intravenous Every 24 hours 04/08/21 1121     04/08/21 1045  cefTRIAXone (ROCEPHIN) 1 g in sodium chloride 0.9 % 100 mL IVPB        1 g 200 mL/hr over 30 Minutes Intravenous  Once 04/08/21 1031 04/08/21 1528   04/08/21 1045  doxycycline (VIBRA-TABS) tablet 100 mg        100 mg Oral  Once 04/08/21 1031 04/08/21 1248              Family Communication/Anticipated D/C date and plan/Code Status   DVT prophylaxis: enoxaparin (LOVENOX) injection 40 mg Start: 04/08/21 1130     Code Status: Full Code  Family Communication: None Disposition Plan: Plan to discharge home tomorrow   Status is:  Inpatient  Remains inpatient appropriate because: IV antibiotics           Subjective:   Interval events noted.  No chest pain, shortness of breath, dizziness, palpitations.  She still has a cough.  She feels weak.  Objective:    Vitals:   04/10/21 0800 04/10/21 0900 04/10/21 1000 04/10/21 1203  BP: (!) 113/54 120/60 127/61 (!) 145/77  Pulse: 92 84 74 90  Resp: (!) 26 (!) 25 (!) 28 20  Temp:    98.2 F (36.8 C)  TempSrc:    Oral  SpO2: 98% 98% 100% 100%  Weight:      Height:       No data found.   Intake/Output Summary (Last 24 hours) at 04/10/2021 1319 Last data filed at 04/10/2021 1022 Gross per 24 hour  Intake 2275.17 ml  Output 2250 ml  Net 25.17 ml   Filed Weights   04/08/21 0753 04/08/21 1612 04/10/21 0400  Weight: 81.6 kg 77.9 kg 81.2 kg    Exam:  GEN: NAD SKIN: Stage I coccygeal decubitus ulcer EYES: EOMI ENT: MMM CV: RRR PULM: CTA B ABD: soft, ND, NT, +BS CNS: AAO x 3, non focal EXT: No edema or tenderness      Data Reviewed:   I have personally reviewed following labs and imaging studies:  Labs: Labs show the following:   Basic Metabolic Panel: Recent Labs  Lab 04/08/21 0818 04/08/21 1124 04/08/21 1910 04/08/21 2329 04/09/21 0404 04/09/21 1524 04/10/21 0533  NA 134*   < > 139 140 141 142 140  K 4.7   < > 3.5 3.1* 2.8* 3.6 4.2  CL 97*   < > 109 112* 116* 113* 111  CO2 7*   < > 7* 12* 12* 13* 17*  GLUCOSE 327*   < > 152* 151* 169* 190* 140*  BUN 29*   < > 25* 19 17 13 15   CREATININE 1.48*   < > 1.10* 0.89 0.78 0.77 0.70  CALCIUM 10.0   < > 9.1 8.8* 8.9 8.7* 8.6*  MG 2.6*  --   --   --  2.0  --  2.0  PHOS  --   --   --   --  1.1*  --  1.8*   < > = values in this interval not displayed.   GFR Estimated Creatinine Clearance: 74.4 mL/min (by C-G formula based on SCr of 0.7 mg/dL). Liver Function Tests: No results for input(s): AST, ALT, ALKPHOS, BILITOT, PROT, ALBUMIN in the last 168 hours.  No results for input(s):  LIPASE, AMYLASE in the last 168 hours. No results for input(s): AMMONIA in the last 168 hours. Coagulation profile No results for input(s): INR, PROTIME in the last 168 hours.  CBC: Recent Labs  Lab 04/08/21 0818 04/09/21 0404 04/10/21 0533  WBC 28.9* 20.2* 17.8*  NEUTROABS 25.7* 18.4* 15.1*  HGB 15.2* 12.7 12.5  HCT 49.7* 38.8 37.4  MCV 97.6 90.9 91.0  PLT 438* 334 356   Cardiac Enzymes: No results for input(s): CKTOTAL, CKMB, CKMBINDEX, TROPONINI in the last 168 hours. BNP (last 3 results) No results for input(s): PROBNP in the last 8760 hours. CBG: Recent Labs  Lab 04/09/21 1142 04/09/21 1623 04/09/21 2127 04/10/21 0832 04/10/21 1125  GLUCAP 147* 161* 199* 143* 164*   D-Dimer: No results for input(s): DDIMER in the last 72 hours. Hgb A1c: Recent Labs    04/08/21 0818  HGBA1C 7.4*   Lipid Profile: No results for input(s): CHOL, HDL, LDLCALC, TRIG, CHOLHDL, LDLDIRECT in the last 72 hours. Thyroid function studies: No results for input(s): TSH, T4TOTAL, T3FREE, THYROIDAB in the last 72 hours.  Invalid input(s): FREET3 Anemia work up: No results for input(s): VITAMINB12, FOLATE, FERRITIN, TIBC, IRON, RETICCTPCT in the last 72 hours. Sepsis Labs: Recent Labs  Lab 04/08/21 0818 04/09/21 0404 04/09/21 1524 04/10/21 0533  WBC 28.9* 20.2*  --  17.8*  LATICACIDVEN  --   --  1.0  --     Microbiology Recent Results (from the past 240 hour(s))  Resp Panel by RT-PCR (Flu A&B, Covid) Nasopharyngeal Swab     Status: None   Collection Time: 04/08/21  8:07 AM   Specimen: Nasopharyngeal Swab; Nasopharyngeal(NP) swabs in vial transport medium  Result Value Ref Range Status   SARS Coronavirus 2 by RT PCR NEGATIVE NEGATIVE Final    Comment: (NOTE) SARS-CoV-2 target nucleic acids are NOT DETECTED.  The SARS-CoV-2 RNA is generally detectable in upper respiratory specimens during the acute phase of infection. The lowest concentration of SARS-CoV-2 viral copies this  assay can detect is 138 copies/mL. A negative result does not preclude SARS-Cov-2 infection and should not be used as the sole basis for treatment or other patient management decisions. A negative result may occur with  improper specimen collection/handling, submission of specimen other than nasopharyngeal swab, presence of viral mutation(s) within the areas targeted by this assay, and inadequate number of viral copies(<138 copies/mL). A negative result must be combined with clinical observations, patient history, and epidemiological information. The expected result is Negative.  Fact Sheet for Patients:  EntrepreneurPulse.com.au  Fact Sheet for Healthcare Providers:  IncredibleEmployment.be  This test is no t yet approved or cleared by the [place] FDA and  has been authorized for detection and/or diagnosis of SARS-CoV-2 by FDA under an Emergency Use Authorization (EUA). This EUA will remain  in effect (meaning this test can be used) for the duration of the COVID-19 declaration under Section 564(b)(1) of the Act, 21 U.S.C.section 360bbb-3(b)(1), unless the authorization is terminated  or revoked sooner.       Influenza A by PCR NEGATIVE NEGATIVE Final   Influenza B by PCR NEGATIVE NEGATIVE Final    Comment: (NOTE) The Xpert Xpress SARS-CoV-2/FLU/RSV plus assay is intended as an aid in the diagnosis of influenza from Nasopharyngeal swab specimens and should not be used as a sole basis for treatment. Nasal washings and aspirates are unacceptable for Xpert Xpress SARS-CoV-2/FLU/RSV testing.  Fact Sheet for Patients: EntrepreneurPulse.com.au  Fact Sheet for Healthcare Providers: IncredibleEmployment.be  This test is not yet approved or cleared by the [place] and has been authorized for detection and/or diagnosis of SARS-CoV-2 by FDA under an Emergency Use Authorization (EUA). This EUA will  remain in effect (meaning this test can be used) for the duration of the COVID-19 declaration under Section 564(b)(1) of the Act, 21 U.S.C. section 360bbb-3(b)(1), unless the authorization is terminated or revoked.  Performed at [facility], [address], [place], [place] [address]   MRSA Next Gen by PCR, Nasal     Status: None   Collection Time: 04/08/21  4:14 PM   Specimen: Nasal Mucosa; Nasal Swab  Result Value Ref Range Status   MRSA by PCR Next Gen NOT DETECTED NOT DETECTED Final    Comment: (NOTE) The GeneXpert MRSA Assay (FDA approved for NASAL specimens only), is one component of a comprehensive MRSA colonization surveillance program. It is not intended to diagnose MRSA infection nor to guide or monitor treatment for MRSA infections. Test performance is not FDA approved in patients less than 5 years old. Performed at [facility], [address], [place], [place] [address]     Procedures and diagnostic studies:  No results found.             LOS: 2 days   Hosteen Kienast  [facility]   Pager on [email or web address]. If 7PM-7AM, please contact night-coverage at [email or web address]     04/10/2021, 1:19 PM

## 2021-04-11 LAB — CBC WITH DIFFERENTIAL/PLATELET
Abs Immature Granulocytes: 0.48 10*3/uL — ABNORMAL HIGH (ref 0.00–0.07)
Basophils Absolute: 0.1 10*3/uL (ref 0.0–0.1)
Basophils Relative: 1 %
Eosinophils Absolute: 0 10*3/uL (ref 0.0–0.5)
Eosinophils Relative: 0 %
HCT: 37.7 % (ref 36.0–46.0)
Hemoglobin: 12.8 g/dL (ref 12.0–15.0)
Immature Granulocytes: 4 %
Lymphocytes Relative: 17 %
Lymphs Abs: 2 10*3/uL (ref 0.7–4.0)
MCH: 30.3 pg (ref 26.0–34.0)
MCHC: 34 g/dL (ref 30.0–36.0)
MCV: 89.3 fL (ref 80.0–100.0)
Monocytes Absolute: 0.7 10*3/uL (ref 0.1–1.0)
Monocytes Relative: 6 %
Neutro Abs: 8.9 10*3/uL — ABNORMAL HIGH (ref 1.7–7.7)
Neutrophils Relative %: 72 %
Platelets: 372 10*3/uL (ref 150–400)
RBC: 4.22 MIL/uL (ref 3.87–5.11)
RDW: 14.6 % (ref 11.5–15.5)
WBC: 12.3 10*3/uL — ABNORMAL HIGH (ref 4.0–10.5)
nRBC: 0 % (ref 0.0–0.2)

## 2021-04-11 LAB — BASIC METABOLIC PANEL
Anion gap: 12 (ref 5–15)
BUN: 14 mg/dL (ref 8–23)
CO2: 20 mmol/L — ABNORMAL LOW (ref 22–32)
Calcium: 8.3 mg/dL — ABNORMAL LOW (ref 8.9–10.3)
Chloride: 107 mmol/L (ref 98–111)
Creatinine, Ser: 0.53 mg/dL (ref 0.44–1.00)
GFR, Estimated: >60 mL/min (ref >60)
Glucose, Bld: 113 mg/dL — ABNORMAL HIGH (ref 70–99)
Potassium: 3.1 mmol/L — ABNORMAL LOW (ref 3.5–5.1)
Sodium: 139 mmol/L (ref 135–145)

## 2021-04-11 LAB — PHOSPHORUS: Phosphorus: 2.3 mg/dL — ABNORMAL LOW (ref 2.5–4.6)

## 2021-04-11 LAB — GLUCOSE, CAPILLARY
Glucose-Capillary: 113 mg/dL — ABNORMAL HIGH (ref 70–99)
Glucose-Capillary: 226 mg/dL — ABNORMAL HIGH (ref 70–99)

## 2021-04-11 LAB — POTASSIUM: Potassium: 4 mmol/L (ref 3.5–5.1)

## 2021-04-11 LAB — MAGNESIUM: Magnesium: 2.1 mg/dL (ref 1.7–2.4)

## 2021-04-11 MED ORDER — AMOXICILLIN-POT CLAVULANATE 875-125 MG PO TABS: 1.0000 | ORAL_TABLET | Freq: Two times a day (BID) | ORAL | 0 refills | Status: AC

## 2021-04-11 MED ORDER — AZITHROMYCIN 250 MG PO TABS
500.0000 mg | ORAL_TABLET | Freq: Every day | ORAL | Status: DC
  Administered 2021-04-11: 500 mg via ORAL
  Filled 2021-04-11: qty 2

## 2021-04-11 MED ORDER — INSULIN GLARGINE-YFGN 100 UNIT/ML ~~LOC~~ SOLN
10.0000 [IU] | Freq: Every day | SUBCUTANEOUS | Status: DC
  Administered 2021-04-11: 10 [IU] via SUBCUTANEOUS
  Filled 2021-04-11 (×2): qty 0.1

## 2021-04-11 MED ORDER — METOPROLOL TARTRATE 25 MG PO TABS: 25.0000 mg | ORAL_TABLET | Freq: Two times a day (BID) | ORAL | 0 refills | Status: AC

## 2021-04-11 MED ORDER — POTASSIUM CHLORIDE CRYS ER 20 MEQ PO TBCR
40.0000 meq | EXTENDED_RELEASE_TABLET | Freq: Once | ORAL | Status: AC
  Administered 2021-04-11: 40 meq via ORAL
  Filled 2021-04-11: qty 2

## 2021-04-11 MED ORDER — AMOXICILLIN-POT CLAVULANATE 875-125 MG PO TABS
1.0000 | ORAL_TABLET | Freq: Two times a day (BID) | ORAL | Status: DC
  Administered 2021-04-11: 1 via ORAL
  Filled 2021-04-11: qty 1

## 2021-04-11 NOTE — Evaluation (Signed)
Physical Therapy Evaluation Patient Details Name: [name] MRN: [id number] DOB: [birth year] Today's Date: 04/11/2021  History of Present Illness  [name] is a 66 y.o. female with medical history significant for type II DM, hypertension, hyperlipidemia, retinopathy, arthritis, who presented to the hospital with cough, fever and shortness of breath.  She said she started coughing about 5 days ago and she developed a fever about 3 days ago and broke out in sweats.  She also started feeling short of breath about 3 days ago but this has progressively worsened.  She drank Gatorade the day before admission and experienced significant heartburn and subsequently vomited.  She said she had not taken her diabetic pills for the past few days because of poor oral intake.  No chest pain, diarrhea, headache, dizziness, syncope or urinary symptoms.   Clinical Impression  Patient functioning near baseline for functional mobility and gait other than requiring use of RW for safety due to having to lean on nearby objects for support without AD.  Patient demonstrates good return for ambulating in room and hallway using RW without loss of balance.  Plan:  patient to be discharged from hospital today and discharged from physical therapy to care of nursing for ambulation daily as tolerated for length of stay.         Recommendations for follow up therapy are one component of a multi-disciplinary discharge planning process, led by the attending physician.  Recommendations may be updated based on patient status, additional functional criteria and insurance authorization.  Follow Up Recommendations Home health PT    Assistance Recommended at Discharge Intermittent Supervision/Assistance  Patient can return home with the following  A little help with walking and/or transfers;A little help with bathing/dressing/bathroom    Equipment Recommendations Rolling walker (2 wheels)  Recommendations for Other  Services       Functional Status Assessment Patient has had a recent decline in their functional status and demonstrates the ability to make significant improvements in function in a reasonable and predictable amount of time.     Precautions / Restrictions Precautions Precautions: Fall Restrictions Weight Bearing Restrictions: No      Mobility  Bed Mobility Overal bed mobility: Modified Independent                  Transfers Overall transfer level: Needs assistance Equipment used: Rolling walker (2 wheels);None Transfers: Sit to/from Stand;Bed to chair/wheelchair/BSC Sit to Stand: Min guard   Step pivot transfers: Min guard;Supervision       General transfer comment: has to lean on nearby objects for support when not using an AD, required RW for safety    Ambulation/Gait Ambulation/Gait assistance: Supervision Gait Distance (Feet): 75 Feet Assistive device: Rolling walker (2 wheels) Gait Pattern/deviations: Decreased step length - left;Decreased stance time - right;Decreased stride length Gait velocity: decreased     General Gait Details: slightly labored cadence without loss of balance, limited mostly due to fatigue  Stairs            Wheelchair Mobility    Modified Rankin (Stroke Patients Only)       Balance Overall balance assessment: Needs assistance Sitting-balance support: Feet supported;No upper extremity supported Sitting balance-Leahy Scale: Good Sitting balance - Comments: seated at EOB   Standing balance support: During functional activity;No upper extremity supported Standing balance-Leahy Scale: Poor Standing balance comment: fair using RW  Pertinent Vitals/Pain Pain Assessment: No/denies pain    Home Living Family/patient expects to be discharged to:: Private residence Living Arrangements: Spouse/significant other Available Help at Discharge: Family;Available 24 hours/day Type of Home:  House Home Access: Stairs to enter Entrance Stairs-Rails: None Entrance Stairs-Number of Steps: 2   Home Layout: One level Home Equipment: Cane - single point      Prior Function Prior Level of Function : Independent/Modified Independent             Mobility Comments: Hydrographic surveyor, works, does not drive ADLs Comments: Independent     Hand Dominance   Dominant Hand: Right    Extremity/Trunk Assessment   Upper Extremity Assessment Upper Extremity Assessment: Overall WFL for tasks assessed    Lower Extremity Assessment Lower Extremity Assessment: Generalized weakness    Cervical / Trunk Assessment Cervical / Trunk Assessment: Normal  Communication   Communication: No difficulties  Cognition Arousal/Alertness: Awake/alert Behavior During Therapy: WFL for tasks assessed/performed Overall Cognitive Status: Within Functional Limits for tasks assessed                                          General Comments      Exercises     Assessment/Plan    PT Assessment All further PT needs can be met in the next venue of care  PT Problem List Decreased strength;Decreased activity tolerance;Decreased balance;Decreased mobility       PT Treatment Interventions      PT Goals (Current goals can be found in the Care Plan section)  Acute Rehab PT Goals Patient Stated Goal: return home with family to assist PT Goal Formulation: With patient Time For Goal Achievement: 04/11/21 Potential to Achieve Goals: Good    Frequency       Co-evaluation               AM-PAC PT "6 Clicks" Mobility  Outcome Measure Help needed turning from your back to your side while in a flat bed without using bedrails?: None Help needed moving from lying on your back to sitting on the side of a flat bed without using bedrails?: None Help needed moving to and from a bed to a chair (including a wheelchair)?: A Little Help needed standing up from a chair using your  arms (e.g., wheelchair or bedside chair)?: A Little Help needed to walk in hospital room?: A Little Help needed climbing 3-5 steps with a railing? : A Little 6 Click Score: 20    End of Session   Activity Tolerance: Patient tolerated treatment well;Patient limited by fatigue Patient left: in chair;with call bell/phone within reach Nurse Communication: Mobility status PT Visit Diagnosis: Unsteadiness on feet (R26.81);Other abnormalities of gait and mobility (R26.89);Muscle weakness (generalized) (M62.81)    Time: 0900-0930 PT Time Calculation (min) (ACUTE ONLY): 30 min   Charges:   PT Evaluation $PT Eval Moderate Complexity: 1 Mod PT Treatments $Therapeutic Activity: 23-37 mins        12:17 PM, 04/11/21 Lonell Grandchild, MPT Physical Therapist with [facility] 336 [phone] office [phone] mobile phone

## 2021-04-11 NOTE — Discharge Summary (Signed)
Physician Discharge Summary  [name]:[id number] DOB: [birth year] DOA: 04/08/2021  PCP: Celene Squibb, MD  Admit date: 04/08/2021 Discharge date: 04/11/2021  Discharge disposition: Home with home health therapy   Recommendations for Outpatient Follow-Up:   Follow-up with PCP in 1 week  Discharge Diagnosis:   Principal Problem:   Aspiration pneumonia ([place]) Active Problems:   DKA, type 2 ([place])   Sepsis ([place])   AKI (acute kidney injury) ([place])   Pressure injury of skin   SVT (supraventricular tachycardia) ([place])    Discharge Condition: Stable.  Diet recommendation:  Diet Order             Diet - low sodium heart healthy           Diet Carb Modified Fluid consistency: Thin; Room service appropriate? Yes  Diet effective now                     Code Status: Full Code     Hospital Course:   Mr. [name] is a 66 y.o. female  with medical history significant for type II DM, hypertension, hyperlipidemia, retinopathy, arthritis, who presented to the hospital with cough, fever and shortness of breath.  She had been ill for a period of about 5 days. She had not taken her diabetic pills because of poor oral intake.   She was found to have severe sepsis secondary to pneumonia (probably aspiration pneumonia) and DKA.  She was treated with empiric IV antibiotics, IV fluids and IV insulin infusion per DKA protocol.  She developed SVT with heart rate up to 196 that was treated with IV adenosine.  She was started on metoprolol for rate control.  She also had hypokalemia and hypophosphatemia that were repleted.  She had AKI and hyponatremia that improved with IV fluids.  Her condition has improved and she is deemed stable for discharge to home today.      Discharge Exam:    Vitals:   04/10/21 2008 04/11/21 0001 04/11/21 0457 04/11/21 0825  BP: 134/73 135/73 (!) 150/81 (!) 158/80  Pulse: 93 87 91 (!) 101  Resp: 20 20    Temp: 98.5 F (36.9 C) 97.9 F (36.6  C) 98.1 F (36.7 C)   TempSrc: Oral Oral Oral   SpO2: 99% 98% 98%   Weight:      Height:         GEN: NAD SKIN: Warm and dry EYES: No pallor or icterus ENT: MMM CV: RRR PULM: Right basilar rales ABD: soft, obese, NT, +BS CNS: AAO x 3, non focal EXT: No edema or tenderness   The results of significant diagnostics from this hospitalization (including imaging, microbiology, ancillary and laboratory) are listed below for reference.     Procedures and Diagnostic Studies:   CT Angio Chest PE W and/or Wo Contrast  Result Date: 04/08/2021 CLINICAL DATA:  Pulmonary embolism (PE) suspected, high prob EXAM: CT ANGIOGRAPHY CHEST WITH CONTRAST TECHNIQUE: Multidetector CT imaging of the chest was performed using the standard protocol during bolus administration of intravenous contrast. Multiplanar CT image reconstructions and MIPs were obtained to evaluate the vascular anatomy. CONTRAST:  30mL OMNIPAQUE IOHEXOL 350 MG/ML SOLN COMPARISON:  Chest XR, concurrent. FINDINGS: Suboptimal evaluation, secondary to motion degradation. Cardiovascular: Degraded evaluation such that subsegmental and small segmental pulmonary emboli could missed. No segmental or larger pulmonary embolus. No pulmonary arterial dilation. Normal heart size. No pericardial effusion. A moderate burden of multivessel coronary atherosclerosis is present. Mediastinum/Nodes:  No enlarged mediastinal, hilar, or axillary lymph nodes. Marked thyromegaly, involving the LEFT thyroid gland and extending outside the field of view, measuring approximally 4.5 x 6.5 cm. Layering fluid with air-and-fluid level within the esophagus. The trachea demonstrates no significant findings. Lungs/Pleura: LEFT posterior basilar consolidation. No pleural effusion or pneumothorax. Upper Abdomen: Nodular thickening of the LEFT adrenal gland without discrete lesion mild fluid distention of stomach. Musculoskeletal: No chest wall abnormality. No acute or significant  osseous findings. Review of the MIP images confirms the above findings. IMPRESSION: Suboptimal evaluation, within these constraints; 1. No segmental or larger pulmonary embolus. 2. LEFT lower lobe consolidation. Findings highly suspicious for aspiration pneumonia/pneumonitis given fluid-distended stomach and esophagus. 3. Goiter with marked enlargement of the LEFT thyroid gland, incompletely assessed. Recommend non-emergent, outpatient US Thyroid if not already performed. Electronically Signed   By: Michaelle Birks M.D.   On: 04/08/2021 10:24   DG Chest Portable 1 View  Result Date: 04/08/2021 CLINICAL DATA:  Shortness of breath, likely positive COVID. Shortness of breath and fever and cough which started Sunday. EXAM: PORTABLE CHEST 1 VIEW COMPARISON:  Chest radiograph dated April 20, 2019 FINDINGS: The heart is enlarged. Atherosclerotic calcification of aortic arch. Low lung volumes. No focal consolidation or large pleural effusion. IMPRESSION: 1. Stable cardiomegaly. 2. Low lung volumes without focal consolidation or pleural effusion. Electronically Signed   By: Keane Police D.O.   On: 04/08/2021 08:34     Labs:   Basic Metabolic Panel: Recent Labs  Lab 04/08/21 0818 04/08/21 1124 04/08/21 2329 04/09/21 0404 04/09/21 1524 04/10/21 0533 04/11/21 0406  NA 134*   < > 140 141 142 140 139  K 4.7   < > 3.1* 2.8* 3.6 4.2 3.1*  CL 97*   < > 112* 116* 113* 111 107  CO2 7*   < > 12* 12* 13* 17* 20*  GLUCOSE 327*   < > 151* 169* 190* 140* 113*  BUN 29*   < > 19 17 13 15 14   CREATININE 1.48*   < > 0.89 0.78 0.77 0.70 0.53  CALCIUM 10.0   < > 8.8* 8.9 8.7* 8.6* 8.3*  MG 2.6*  --   --  2.0  --  2.0 2.1  PHOS  --   --   --  1.1*  --  1.8* 2.3*   < > = values in this interval not displayed.   GFR Estimated Creatinine Clearance: 74.4 mL/min (by C-G formula based on SCr of 0.53 mg/dL). Liver Function Tests: No results for input(s): AST, ALT, ALKPHOS, BILITOT, PROT, ALBUMIN in the last 168  hours. No results for input(s): LIPASE, AMYLASE in the last 168 hours. No results for input(s): AMMONIA in the last 168 hours. Coagulation profile No results for input(s): INR, PROTIME in the last 168 hours.  CBC: Recent Labs  Lab 04/08/21 0818 04/09/21 0404 04/10/21 0533 04/11/21 0406  WBC 28.9* 20.2* 17.8* 12.3*  NEUTROABS 25.7* 18.4* 15.1* 8.9*  HGB 15.2* 12.7 12.5 12.8  HCT 49.7* 38.8 37.4 37.7  MCV 97.6 90.9 91.0 89.3  PLT 438* 334 356 372   Cardiac Enzymes: No results for input(s): CKTOTAL, CKMB, CKMBINDEX, TROPONINI in the last 168 hours. BNP: Invalid input(s): POCBNP CBG: Recent Labs  Lab 04/10/21 1125 04/10/21 1632 04/10/21 2057 04/11/21 0714 04/11/21 1110  GLUCAP 164* 172* 180* 113* 226*   D-Dimer No results for input(s): DDIMER in the last 72 hours. Hgb A1c No results for input(s): HGBA1C in the last 72 hours.  Lipid Profile No results for input(s): CHOL, HDL, LDLCALC, TRIG, CHOLHDL, LDLDIRECT in the last 72 hours. Thyroid function studies No results for input(s): TSH, T4TOTAL, T3FREE, THYROIDAB in the last 72 hours.  Invalid input(s): FREET3 Anemia work up No results for input(s): VITAMINB12, FOLATE, FERRITIN, TIBC, IRON, RETICCTPCT in the last 72 hours. Microbiology Recent Results (from the past 240 hour(s))  Resp Panel by RT-PCR (Flu A&B, Covid) Nasopharyngeal Swab     Status: None   Collection Time: 04/08/21  8:07 AM   Specimen: Nasopharyngeal Swab; Nasopharyngeal(NP) swabs in vial transport medium  Result Value Ref Range Status   SARS Coronavirus 2 by RT PCR NEGATIVE NEGATIVE Final    Comment: (NOTE) SARS-CoV-2 target nucleic acids are NOT DETECTED.  The SARS-CoV-2 RNA is generally detectable in upper respiratory specimens during the acute phase of infection. The lowest concentration of SARS-CoV-2 viral copies this assay can detect is 138 copies/mL. A negative result does not preclude SARS-Cov-2 infection and should not be used as the sole  basis for treatment or other patient management decisions. A negative result may occur with  improper specimen collection/handling, submission of specimen other than nasopharyngeal swab, presence of viral mutation(s) within the areas targeted by this assay, and inadequate number of viral copies(<138 copies/mL). A negative result must be combined with clinical observations, patient history, and epidemiological information. The expected result is Negative.  Fact Sheet for Patients:  EntrepreneurPulse.com.au  Fact Sheet for Healthcare Providers:  IncredibleEmployment.be  This test is no t yet approved or cleared by the [place] FDA and  has been authorized for detection and/or diagnosis of SARS-CoV-2 by FDA under an Emergency Use Authorization (EUA). This EUA will remain  in effect (meaning this test can be used) for the duration of the COVID-19 declaration under Section 564(b)(1) of the Act, 21 U.S.C.section 360bbb-3(b)(1), unless the authorization is terminated  or revoked sooner.       Influenza A by PCR NEGATIVE NEGATIVE Final   Influenza B by PCR NEGATIVE NEGATIVE Final    Comment: (NOTE) The Xpert Xpress SARS-CoV-2/FLU/RSV plus assay is intended as an aid in the diagnosis of influenza from Nasopharyngeal swab specimens and should not be used as a sole basis for treatment. Nasal washings and aspirates are unacceptable for Xpert Xpress SARS-CoV-2/FLU/RSV testing.  Fact Sheet for Patients: EntrepreneurPulse.com.au  Fact Sheet for Healthcare Providers: IncredibleEmployment.be  This test is not yet approved or cleared by the [place] FDA and has been authorized for detection and/or diagnosis of SARS-CoV-2 by FDA under an Emergency Use Authorization (EUA). This EUA will remain in effect (meaning this test can be used) for the duration of the COVID-19 declaration under Section 564(b)(1) of the Act,  21 U.S.C. section 360bbb-3(b)(1), unless the authorization is terminated or revoked.  Performed at [facility], [address] [place], [place] [address]   MRSA Next Gen by PCR, Nasal     Status: None   Collection Time: 04/08/21  4:14 PM   Specimen: Nasal Mucosa; Nasal Swab  Result Value Ref Range Status   MRSA by PCR Next Gen NOT DETECTED NOT DETECTED Final    Comment: (NOTE) The GeneXpert MRSA Assay (FDA approved for NASAL specimens only), is one component of a comprehensive MRSA colonization surveillance program. It is not intended to diagnose MRSA infection nor to guide or monitor treatment for MRSA infections. Test performance is not FDA approved in patients less than 52 years old. Performed at [facility], [address], [place], [place] [address]  Discharge Instructions:   Discharge Instructions     Diet - low sodium heart healthy   Complete by: As directed    Discharge wound care:   Complete by: As directed    Avoid laying on your back or sitting on your buttocks for too long   Increase activity slowly   Complete by: As directed       Allergies as of 04/11/2021       Reactions   Anesthetics, Amide Nausea And Vomiting   Codeine Nausea And Vomiting        Medication List     STOP taking these medications    APPLE CIDER VINEGAR PO   doxycycline 100 MG tablet Commonly known as: VIBRA-TABS   HYDROcodone-acetaminophen 5-325 MG tablet Commonly known as: NORCO/VICODIN       TAKE these medications    acidophilus Caps capsule Take 1 capsule by mouth daily.   amoxicillin-clavulanate 875-125 MG tablet Commonly known as: AUGMENTIN Take 1 tablet by mouth 2 (two) times daily for 3 days.   glipiZIDE 10 MG tablet Commonly known as: GLUCOTROL Take 10 mg by mouth 2 (two) times daily.   lisinopril-hydrochlorothiazide 20-12.5 MG tablet Commonly known as: ZESTORETIC Take by mouth daily.   metoprolol tartrate 25 MG tablet Commonly known as:  LOPRESSOR Take 1 tablet (25 mg total) by mouth 2 (two) times daily.   MULTIVITAMIN PO Take 1 tablet by mouth daily.   Nexletol 180 MG Tabs Generic drug: Bempedoic Acid Take 1 tablet by mouth daily.   OCUVITE ADULT 50+ PO Take 1 tablet by mouth daily.   OSTEO BI-FLEX REGULAR STRENGTH PO Take 2 tablets by mouth daily.   Synjardy XR 01-999 MG Tb24 Generic drug: Empagliflozin-metFORMIN HCl ER Take 2 tablets by mouth daily.               Durable Medical Equipment  (From admission, onward)           Start     Ordered   04/11/21 1322  DME Walker  Once       Question Answer Comment  Walker: With 5 Inch Wheels   Patient needs a walker to treat with the following condition Debility      04/11/21 1323              Discharge Care Instructions  (From admission, onward)           Start     Ordered   04/11/21 0000  Discharge wound care:       Comments: Avoid laying on your back or sitting on your buttocks for too long   04/11/21 1323               If you experience worsening of your admission symptoms, develop shortness of breath, life threatening emergency, suicidal or homicidal thoughts you must seek medical attention immediately by calling 911 or calling your MD immediately  if symptoms less severe.   You must read complete instructions/literature along with all the possible adverse reactions/side effects for all the medicines you take and that have been prescribed to you. Take any new medicines after you have completely understood and accept all the possible adverse reactions/side effects.    Please note   You were cared for by a hospitalist during your hospital stay. If you have any questions about your discharge medications or the care you received while you were in the hospital after you are discharged, you can call the unit and asked to  speak with the hospitalist on call if the hospitalist that took care of you is not available. Once you are  discharged, your primary care physician will handle any further medical issues. Please note that NO REFILLS for any discharge medications will be authorized once you are discharged, as it is imperative that you return to your primary care physician (or establish a relationship with a primary care physician if you do not have one) for your aftercare needs so that they can reassess your need for medications and monitor your lab values.       Time coordinating discharge: 33 minutes  Signed:  Jasmin Winberry  [facility] 04/11/2021, 1:23 PM   Pager on [email or web address]. If 7PM-7AM, please contact night-coverage at [email or web address]

## 2021-04-11 NOTE — TOC Transition Note (Signed)
Transition of Care [facility]) - CM/SW Discharge Note   Patient Details  Name: KELSY POLACK MRN: [id number] Date of Birth: 1956/02/22  [facility] CM/SW Contact:  Shade Flood, LCSW Phone Number: 04/11/2021, 10:25 AM   Clinical Narrative:     Pt stable for dc today per MD. PT recommending HHPT at dc. Spoke with pt to review dc planning. Discussed PT recommendation with pt who states that she does not want TOC to arrange [place]. She does state that she would like a RW for dc. Requested order from MD. CMS provider options reviewed with pt and arranged RW as requested. It will be delivered to pt's room prior to dc.  Pt does not identify any other TOC needs for dc.  Final next level of care: Home/Self Care Barriers to Discharge: Barriers Resolved   Patient Goals and CMS Choice Patient states their goals for this hospitalization and ongoing recovery are:: go home CMS Medicare.gov Compare Post Acute Care list provided to:: Patient Choice offered to / list presented to : Patient  Discharge Placement                       Discharge Plan and Services                DME Arranged: Walker rolling DME Agency: [employer] Date DME Agency Contacted: 04/11/21   Representative spoke with at DME Agency: Caryl Pina HH Arranged: Refused [place]          Social Determinants of Health ([place]) Interventions     Readmission Risk Interventions No flowsheet data found.

## 2021-04-13 ENCOUNTER — Encounter (INDEPENDENT_AMBULATORY_CARE_PROVIDER_SITE_OTHER): Payer: Medicare Other | Admitting: Ophthalmology

## 2021-04-14 ENCOUNTER — Encounter (INDEPENDENT_AMBULATORY_CARE_PROVIDER_SITE_OTHER): Payer: Medicare Other | Admitting: Ophthalmology

## 2021-04-19 DIAGNOSIS — R197 Diarrhea, unspecified: Secondary | ICD-10-CM | POA: Diagnosis not present

## 2021-04-19 DIAGNOSIS — B370 Candidal stomatitis: Secondary | ICD-10-CM | POA: Diagnosis not present

## 2021-04-19 DIAGNOSIS — H6691 Otitis media, unspecified, right ear: Secondary | ICD-10-CM | POA: Diagnosis not present

## 2021-04-19 DIAGNOSIS — E782 Mixed hyperlipidemia: Secondary | ICD-10-CM | POA: Diagnosis not present

## 2021-04-19 DIAGNOSIS — H7292 Unspecified perforation of tympanic membrane, left ear: Secondary | ICD-10-CM | POA: Diagnosis not present

## 2021-04-19 DIAGNOSIS — R5383 Other fatigue: Secondary | ICD-10-CM | POA: Diagnosis not present

## 2021-04-27 DIAGNOSIS — M9901 Segmental and somatic dysfunction of cervical region: Secondary | ICD-10-CM | POA: Diagnosis not present

## 2021-04-27 DIAGNOSIS — M5413 Radiculopathy, cervicothoracic region: Secondary | ICD-10-CM | POA: Diagnosis not present

## 2021-04-27 DIAGNOSIS — M9902 Segmental and somatic dysfunction of thoracic region: Secondary | ICD-10-CM | POA: Diagnosis not present

## 2021-04-29 DIAGNOSIS — M9901 Segmental and somatic dysfunction of cervical region: Secondary | ICD-10-CM | POA: Diagnosis not present

## 2021-04-29 DIAGNOSIS — M5413 Radiculopathy, cervicothoracic region: Secondary | ICD-10-CM | POA: Diagnosis not present

## 2021-04-29 DIAGNOSIS — M9902 Segmental and somatic dysfunction of thoracic region: Secondary | ICD-10-CM | POA: Diagnosis not present

## 2021-05-02 DIAGNOSIS — M9901 Segmental and somatic dysfunction of cervical region: Secondary | ICD-10-CM | POA: Diagnosis not present

## 2021-05-02 DIAGNOSIS — M5413 Radiculopathy, cervicothoracic region: Secondary | ICD-10-CM | POA: Diagnosis not present

## 2021-05-02 DIAGNOSIS — M9902 Segmental and somatic dysfunction of thoracic region: Secondary | ICD-10-CM | POA: Diagnosis not present

## 2021-05-03 ENCOUNTER — Encounter (INDEPENDENT_AMBULATORY_CARE_PROVIDER_SITE_OTHER): Payer: Self-pay | Admitting: Ophthalmology

## 2021-05-03 ENCOUNTER — Other Ambulatory Visit: Payer: Self-pay

## 2021-05-03 ENCOUNTER — Ambulatory Visit (INDEPENDENT_AMBULATORY_CARE_PROVIDER_SITE_OTHER): Payer: Medicare Other | Admitting: Ophthalmology

## 2021-05-03 ENCOUNTER — Encounter (INDEPENDENT_AMBULATORY_CARE_PROVIDER_SITE_OTHER): Payer: Medicare Other | Admitting: Ophthalmology

## 2021-05-03 DIAGNOSIS — E113551 Type 2 diabetes mellitus with stable proliferative diabetic retinopathy, right eye: Secondary | ICD-10-CM | POA: Diagnosis not present

## 2021-05-03 DIAGNOSIS — E113552 Type 2 diabetes mellitus with stable proliferative diabetic retinopathy, left eye: Secondary | ICD-10-CM | POA: Diagnosis not present

## 2021-05-03 NOTE — Progress Notes (Signed)
05/03/2021     CHIEF COMPLAINT Patient presents for  Chief Complaint  Patient presents with   Retina Follow Up      HISTORY OF PRESENT ILLNESS: [name] is a 66 y.o. female who presents to the clinic today for:   HPI     Retina Follow Up           Diagnosis: Diabetic Retinopathy   Laterality: left eye   Onset: 4 months ago   Severity: mild   Duration: 4 months         Comments   4 mos fu OU oct fp. Pt states "I have had a severe sinus and ear infection, since I was in the hospital for septic pneumonia. I went in to the hospital on January 6th and got out January 9th. The sinus infection effects my vision, since I have been sick it has been really blurry. Before I got sick my vision was doing okay." Denies new FOL or floaters. Pt states she is not on Lisinopril anymore, and her regular doctor has not sent her a new prescription yet.      Last edited by Laurin Coder on 05/03/2021  8:54 AM.      Referring physician: Celene Squibb, MD [place],  [place] [address]  HISTORICAL INFORMATION:   Selected notes from the MEDICAL RECORD NUMBER    Lab Results  Component Value Date   HGBA1C 7.4 (H) 04/08/2021     CURRENT MEDICATIONS: No current outpatient medications on file. (Ophthalmic Drugs)   No current facility-administered medications for this visit. (Ophthalmic Drugs)   Current Outpatient Medications (Other)  Medication Sig   acidophilus (RISAQUAD) CAPS capsule Take 1 capsule by mouth daily.   glipiZIDE (GLUCOTROL) 10 MG tablet Take 10 mg by mouth 2 (two) times daily.   Glucosamine-Chondroitin (OSTEO BI-FLEX REGULAR STRENGTH PO) Take 2 tablets by mouth daily.   lisinopril-hydrochlorothiazide (ZESTORETIC) 20-12.5 MG tablet Take by mouth daily.   metoprolol tartrate (LOPRESSOR) 25 MG tablet Take 1 tablet (25 mg total) by mouth 2 (two) times daily.   Multiple Vitamins-Minerals (MULTIVITAMIN PO) Take 1 tablet by mouth daily.    Multiple Vitamins-Minerals (OCUVITE ADULT 50+ PO) Take 1 tablet by mouth daily.   NEXLETOL 180 MG TABS Take 1 tablet by mouth daily.   SYNJARDY XR 01-999 MG TB24 Take 2 tablets by mouth daily.   No current facility-administered medications for this visit. (Other)      REVIEW OF SYSTEMS:    ALLERGIES Allergies  Allergen Reactions   Anesthetics, Amide Nausea And Vomiting   Codeine Nausea And Vomiting    PAST MEDICAL HISTORY Past Medical History:  Diagnosis Date   Arthritis    OSTEO   Family history of adverse reaction to anesthesia    Mother - N/V   Goiter    Hyperlipidemia    Hypertension    PONV (postoperative nausea and vomiting)    Retinopathy    Past Surgical History:  Procedure Laterality Date   ABDOMINAL AORTOGRAM W/LOWER EXTREMITY N/A 03/31/2020   Procedure: ABDOMINAL AORTOGRAM W/LOWER EXTREMITY;  Surgeon: Cherre Robins, MD;  Location: [facility];  Service: Cardiovascular;  Laterality: N/A;   ABDOMINAL HYSTERECTOMY     AIR/FLUID EXCHANGE Left 11/30/2014   Procedure: AIR/FLUID EXCHANGE LEFT EYE;  Surgeon: Hurman Horn, MD;  Location: [facility];  Service: Ophthalmology;  Laterality: Left;   AMPUTATION Left 06/11/2020   Procedure: LEFT GREAT TOE AMPUTATION;  Surgeon: Newt Minion, MD;  Location: [facility];  Service: Orthopedics;  Laterality: Left;   BACK SURGERY     CESAREAN SECTION     x 2   EYE SURGERY     INJECTION OF SILICONE OIL Right 3/35/4562   Procedure: INJECTION OF SILICONE OIL;  Surgeon: Hurman Horn, MD;  Location: [facility];  Service: Ophthalmology;  Laterality: Right;   INJECTION OF SILICONE OIL Left 5/63/8937   Procedure: INJECTION OF SILICONE OIL;  Surgeon: Hurman Horn, MD;  Location: [facility];  Service: Ophthalmology;  Laterality: Left;   LASER PHOTO ABLATION Right 05/26/2014   Procedure: LASER PHOTO ABLATION;  Surgeon: Hurman Horn, MD;  Location: [facility];  Service: Ophthalmology;  Laterality: Right;   PARS PLANA VITRECTOMY Right 05/26/2014    Procedure: PARS PLANA VITRECTOMY 25 GAUGE;  Surgeon: Hurman Horn, MD;  Location: [facility];  Service: Ophthalmology;  Laterality: Right;   PARS PLANA VITRECTOMY Left 11/30/2014   Procedure: PARS PLANA VITRECTOMY WITH 25 GAUGE with endolaser;  Surgeon: Hurman Horn, MD;  Location: [facility];  Service: Ophthalmology;  Laterality: Left;   PERIPHERAL VASCULAR INTERVENTION Left 03/31/2020   Procedure: PERIPHERAL VASCULAR INTERVENTION;  Surgeon: Cherre Robins, MD;  Location: [facility];  Service: Cardiovascular;  Laterality: Left;  superficial femoral   TONSILLECTOMY     TOTAL HIP ARTHROPLASTY Right 04/21/2014   dr Percell Miller   TOTAL HIP ARTHROPLASTY Right 04/21/2014   Procedure: RIGHT TOTAL HIP ARTHROPLASTY ANTERIOR APPROACH;  Surgeon: Renette Butters, MD;  Location: [facility];  Service: Orthopedics;  Laterality: Right;    FAMILY HISTORY Family History  Problem Relation Age of Onset   Healthy Mother    Healthy Father     SOCIAL HISTORY Social History   Tobacco Use   Smoking status: Former   Smokeless tobacco: Never   Tobacco comments:    smoked as a teenager  Scientific laboratory 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 used  Substance Use Topics   Alcohol use: No   Drug use: No         OPHTHALMIC EXAM:  Base Eye Exam     Visual Acuity (ETDRS)       Right Left   Dist [place] 20/100 20/200   Dist ph [place] NI 8:53 AM         Tonometry (Tonopen, 8:53 AM)       Right Left   Pressure 32 18         Pupils       Pupils Dark Light APD   Right PERRL 4 4 None   Left PERRL 3 3 None         Extraocular Movement       Right Left    Full Full         Neuro/Psych     Oriented x3: Yes   Mood/Affect: Normal         Dilation     Both eyes: 1.0% Mydriacyl, 2.5% Phenylephrine @ 8:54 AM           Slit Lamp and Fundus Exam     External Exam       Right Left   External Normal Normal         Slit Lamp Exam       Right Left   Lids/Lashes Normal Normal   Conjunctiva/Sclera White and  quiet 2+ Injection   Cornea Clear Clear   Anterior Chamber Deep and quiet Deep  and quiet   [place] and reactive Pharmacologically dilated   Lens Centered posterior chamber intraocular lens Centered posterior chamber intraocular lens   Anterior Vitreous Normal, clear 1+ cell         Fundus Exam       Right Left   Posterior Vitreous Clear, avitric,  clear avitric   Disc 1+ Optic disc atrophy Normal   C/D Ratio 0.65 0.65   Macula Good focal laser scars, attached, geographic atrophy present centrally Good focal laser scars, attached, atrophy present centrally   Vessels Quiet PDR, good PRP peripherally Quiet PDR, good PRP peripherally   Periphery Attached, good PRP 360 Attached, good PRP 360            IMAGING AND PROCEDURES  Imaging and Procedures for 05/03/21  OCT, Retina - OU - Both Eyes       Right Eye Quality was good. Scan locations included subfoveal. Central Foveal Thickness: 199. Progression has been stable. Findings include abnormal foveal contour, outer retinal atrophy, inner retinal atrophy, central retinal atrophy.   Left Eye Quality was good. Scan locations included subfoveal. Central Foveal Thickness: 261. Progression has been stable. Findings include abnormal foveal contour, central retinal atrophy, inner retinal atrophy, outer retinal atrophy.   Notes OD, clear media post vitrectomy, silicone oil removal.  OS, clear media, foveal atrophy from prior RPE degeneration and macular nonperfusion     Color Fundus Photography Optos - OU - Both Eyes       Right Eye Progression has been stable. Disc findings include increased cup to disc ratio, pallor. Macula : retinal pigment epithelium abnormalities.   Left Eye Progression has been stable. Disc findings include increased cup to disc ratio. Macula : retinal pigment epithelium abnormalities.   Notes Bilateral quiet PDR, clear media, clear media OU, post vitrectomy post oil removal  OS, with quiescent PDR.   Bilateral foveal atrophy.  PDR quiet OU               ASSESSMENT/PLAN:  Stable treated proliferative diabetic retinopathy of right eye determined by examination associated with type 2 diabetes mellitus ([place]) Stable OU, clear media  Stable treated proliferative diabetic retinopathy of left eye determined by examination associated with type 2 diabetes mellitus ([place]) Stable OU clinically     ICD-10-CM   1. Stable treated proliferative diabetic retinopathy of left eye determined by examination associated with type 2 diabetes mellitus ([place])  N17.0017 OCT, Retina - OU - Both Eyes    Color Fundus Photography Optos - OU - Both Eyes    2. Stable treated proliferative diabetic retinopathy of right eye determined by examination associated with type 2 diabetes mellitus ([place])  E11.3551 OCT, Retina - OU - Both Eyes    Color Fundus Photography Optos - OU - Both Eyes      1.  OU, stable retinopathy with quiet PDR now after vitrectomy and removal of silicone oil OU.  2.  No active disease  3.  Acuity limited by geographic atrophy which extended into the macular region of each eye but more extensive left eye  4.  I do agree that patient should have, complete refraction under the direction of Dr. Mabeline Caras  at [facility] Ordered this visit:  No orders of the defined types were placed in this encounter.      Return in about 9 months (around 01/31/2022) for DILATE OU, OCT, COLOR FP.  There are no Patient Instructions on file for this visit.  Explained the diagnoses, plan, and follow up with the patient and they expressed understanding.  Patient expressed understanding of the importance of proper follow up care.   Clent Demark Teighan Aubert M.D. Diseases & Surgery of the Retina and Vitreous Retina & Diabetic [facility] 05/03/21     Abbreviations: M myopia (nearsighted); A astigmatism; H hyperopia (farsighted); P presbyopia; Mrx spectacle prescription;  CTL contact lenses; OD  right eye; OS left eye; OU both eyes  XT exotropia; ET esotropia; PEK punctate epithelial keratitis; PEE punctate epithelial erosions; DES dry eye syndrome; MGD meibomian gland dysfunction; ATs artificial tears; PFAT's preservative free artificial tears; [place] nuclear sclerotic cataract; PSC posterior subcapsular cataract; ERM epi-retinal membrane; PVD posterior vitreous detachment; RD retinal detachment; DM diabetes mellitus; DR diabetic retinopathy; NPDR non-proliferative diabetic retinopathy; PDR proliferative diabetic retinopathy; CSME clinically significant macular edema; DME diabetic macular edema; dbh dot blot hemorrhages; CWS cotton wool spot; POAG primary open angle glaucoma; C/D cup-to-disc ratio; HVF humphrey visual field; GVF goldmann visual field; OCT optical coherence tomography; IOP intraocular pressure; BRVO Branch retinal vein occlusion; CRVO central retinal vein occlusion; CRAO central retinal artery occlusion; BRAO branch retinal artery occlusion; RT retinal tear; SB scleral buckle; PPV pars plana vitrectomy; VH Vitreous hemorrhage; PRP panretinal laser photocoagulation; IVK intravitreal kenalog; VMT vitreomacular traction; MH Macular hole;  NVD neovascularization of the disc; NVE neovascularization elsewhere; AREDS age related eye disease study; ARMD age related macular degeneration; POAG primary open angle glaucoma; EBMD epithelial/anterior basement membrane dystrophy; ACIOL anterior chamber intraocular lens; IOL intraocular lens; PCIOL posterior chamber intraocular lens; Phaco/IOL phacoemulsification with intraocular lens placement; [place] photorefractive keratectomy; LASIK laser assisted in situ keratomileusis; HTN hypertension; DM diabetes mellitus; COPD chronic obstructive pulmonary disease

## 2021-05-03 NOTE — Assessment & Plan Note (Signed)
Stable OU clinically

## 2021-05-03 NOTE — Assessment & Plan Note (Signed)
Stable OU, clear media

## 2021-05-04 DIAGNOSIS — M9901 Segmental and somatic dysfunction of cervical region: Secondary | ICD-10-CM | POA: Diagnosis not present

## 2021-05-04 DIAGNOSIS — M9902 Segmental and somatic dysfunction of thoracic region: Secondary | ICD-10-CM | POA: Diagnosis not present

## 2021-05-04 DIAGNOSIS — M5413 Radiculopathy, cervicothoracic region: Secondary | ICD-10-CM | POA: Diagnosis not present

## 2021-05-05 ENCOUNTER — Encounter (HOSPITAL_COMMUNITY): Payer: Self-pay | Admitting: Radiology

## 2021-05-05 DIAGNOSIS — H2513 Age-related nuclear cataract, bilateral: Secondary | ICD-10-CM | POA: Diagnosis not present

## 2021-05-05 DIAGNOSIS — H40033 Anatomical narrow angle, bilateral: Secondary | ICD-10-CM | POA: Diagnosis not present

## 2021-05-09 DIAGNOSIS — M5413 Radiculopathy, cervicothoracic region: Secondary | ICD-10-CM | POA: Diagnosis not present

## 2021-05-09 DIAGNOSIS — M9901 Segmental and somatic dysfunction of cervical region: Secondary | ICD-10-CM | POA: Diagnosis not present

## 2021-05-09 DIAGNOSIS — M9902 Segmental and somatic dysfunction of thoracic region: Secondary | ICD-10-CM | POA: Diagnosis not present

## 2021-05-10 ENCOUNTER — Other Ambulatory Visit (HOSPITAL_COMMUNITY): Payer: Self-pay | Admitting: Family Medicine

## 2021-05-10 DIAGNOSIS — E048 Other specified nontoxic goiter: Secondary | ICD-10-CM

## 2021-05-11 DIAGNOSIS — M9901 Segmental and somatic dysfunction of cervical region: Secondary | ICD-10-CM | POA: Diagnosis not present

## 2021-05-11 DIAGNOSIS — M5413 Radiculopathy, cervicothoracic region: Secondary | ICD-10-CM | POA: Diagnosis not present

## 2021-05-11 DIAGNOSIS — M9902 Segmental and somatic dysfunction of thoracic region: Secondary | ICD-10-CM | POA: Diagnosis not present

## 2021-05-18 DIAGNOSIS — M5413 Radiculopathy, cervicothoracic region: Secondary | ICD-10-CM | POA: Diagnosis not present

## 2021-05-18 DIAGNOSIS — M9901 Segmental and somatic dysfunction of cervical region: Secondary | ICD-10-CM | POA: Diagnosis not present

## 2021-05-18 DIAGNOSIS — M9902 Segmental and somatic dysfunction of thoracic region: Secondary | ICD-10-CM | POA: Diagnosis not present

## 2021-05-20 ENCOUNTER — Ambulatory Visit (HOSPITAL_COMMUNITY)
Admission: RE | Admit: 2021-05-20 | Discharge: 2021-05-20 | Disposition: A | Discharge status: Home / Self Care | Payer: Medicare Other | Source: Ambulatory Visit | Attending: Family Medicine | Admitting: Family Medicine

## 2021-05-20 ENCOUNTER — Other Ambulatory Visit: Payer: Self-pay

## 2021-05-20 DIAGNOSIS — E041 Nontoxic single thyroid nodule: Secondary | ICD-10-CM | POA: Diagnosis not present

## 2021-05-20 DIAGNOSIS — E048 Other specified nontoxic goiter: Secondary | ICD-10-CM | POA: Insufficient documentation

## 2021-05-23 ENCOUNTER — Other Ambulatory Visit: Payer: Self-pay | Admitting: Family Medicine

## 2021-05-23 DIAGNOSIS — E041 Nontoxic single thyroid nodule: Secondary | ICD-10-CM

## 2021-05-25 ENCOUNTER — Other Ambulatory Visit (HOSPITAL_COMMUNITY)
Admission: RE | Admit: 2021-05-25 | Discharge: 2021-05-25 | Disposition: A | Discharge status: Home / Self Care | Payer: Medicare Other | Source: Ambulatory Visit | Attending: Family Medicine | Admitting: Family Medicine

## 2021-05-25 ENCOUNTER — Ambulatory Visit
Admission: RE | Admit: 2021-05-25 | Discharge: 2021-05-25 | Disposition: A | Discharge status: Home / Self Care | Payer: Medicare Other | Source: Ambulatory Visit | Attending: Family Medicine | Admitting: Family Medicine

## 2021-05-25 DIAGNOSIS — E041 Nontoxic single thyroid nodule: Secondary | ICD-10-CM | POA: Diagnosis present

## 2021-05-25 DIAGNOSIS — D440 Neoplasm of uncertain behavior of thyroid gland: Secondary | ICD-10-CM | POA: Diagnosis not present

## 2021-05-25 DIAGNOSIS — E0789 Other specified disorders of thyroid: Secondary | ICD-10-CM | POA: Diagnosis not present

## 2021-05-25 DIAGNOSIS — E042 Nontoxic multinodular goiter: Secondary | ICD-10-CM | POA: Diagnosis not present

## 2021-05-29 LAB — CYTOLOGY - NON PAP

## 2021-05-31 DIAGNOSIS — E041 Nontoxic single thyroid nodule: Secondary | ICD-10-CM | POA: Diagnosis not present

## 2021-06-09 ENCOUNTER — Ambulatory Visit (INDEPENDENT_AMBULATORY_CARE_PROVIDER_SITE_OTHER): Payer: Medicare Other | Admitting: Orthopedic Surgery

## 2021-06-09 ENCOUNTER — Other Ambulatory Visit: Payer: Self-pay

## 2021-06-09 DIAGNOSIS — M205X2 Other deformities of toe(s) (acquired), left foot: Secondary | ICD-10-CM | POA: Diagnosis not present

## 2021-06-09 DIAGNOSIS — L97521 Non-pressure chronic ulcer of other part of left foot limited to breakdown of skin: Secondary | ICD-10-CM

## 2021-06-12 ENCOUNTER — Encounter: Payer: Self-pay | Admitting: Orthopedic Surgery

## 2021-06-12 NOTE — Progress Notes (Signed)
? ?Office Visit Note ?  ?Patient: [name]           ?Date of Birth: 1955-06-10           ?MRN: [id number] ?Visit Date: 06/09/2021 ?             ?Requested by: Celene Squibb, MD ?[address] ?[name] ?[place],  [place] [address] ?PCP: Celene Squibb, MD ? ?Chief Complaint  ?Patient presents with  ? Left Foot - Wound Check  ? ? ? ? ?HPI: ?Patient is a 66 year old woman who was seen for initial evaluation for ulcers over the second and third toes left foot.  Patient is status post great toe amputation on the left.  Patient states she has recently been hospitalized for pneumonia. ? ?Assessment & Plan: ?Visit Diagnoses:  ?1. Claw toe, acquired, left   ?2. Non-pressure chronic ulcer of other part of left foot limited to breakdown of skin ([place])   ? ? ?Plan: Recommended stiff soled sneakers pressure offloading ? ?Follow-Up Instructions: Return in about 4 weeks (around 07/07/2021).  ? ?Ortho Exam ? ?Patient is alert, oriented, no adenopathy, well-dressed, normal affect, normal respiratory effort. ?Examination patient has dorsiflexion of 20 degrees of the ankle she has fixed clawing of the lesser toes.  She has an abrasion ulcer over the dorsum of the second and third toes PIP joint.  She is currently wearing crocs.  Patient has a palpable pulse.  The ulcers are superficial and there is no exposed bone or tendon.  There is no cellulitis no drainage no sausage digit swelling. ? ?Imaging: ?No results found. ?No images are attached to the encounter. ? ?Labs: ?Lab Results  ?Component Value Date  ? HGBA1C 7.4 (H) 04/08/2021  ? HGBA1C 7.2 (H) 06/03/2020  ? ? ? ?Lab Results  ?Component Value Date  ? ALBUMIN 3.6 06/03/2020  ? ALBUMIN 3.2 (L) 04/20/2019  ? ? ?Lab Results  ?Component Value Date  ? MG 2.1 04/11/2021  ? MG 2.0 04/10/2021  ? MG 2.0 04/09/2021  ? ?No results found for: VD25OH ? ?No results found for: PREALBUMIN ?CBC EXTENDED Latest Ref Rng & Units 04/11/2021 04/10/2021 04/09/2021  ?WBC 4.0 - 10.5 K/uL 12.3(H) 17.8(H) 20.2(H)  ?RBC  3.87 - 5.11 MIL/uL 4.22 4.11 4.27  ?HGB 12.0 - 15.0 g/dL 12.8 12.5 12.7  ?HCT 36.0 - 46.0 % 37.7 37.4 38.8  ?PLT 150 - 400 K/uL 372 356 334  ?NEUTROABS 1.7 - 7.7 K/uL 8.9(H) 15.1(H) 18.4(H)  ?LYMPHSABS 0.7 - 4.0 K/uL 2.0 1.5 0.8  ? ? ? ?There is no height or weight on file to calculate BMI. ? ?Orders:  ?No orders of the defined types were placed in this encounter. ? ?No orders of the defined types were placed in this encounter. ? ? ? Procedures: ?No procedures performed ? ?Clinical Data: ?No additional findings. ? ?ROS: ? ?All other systems negative, except as noted in the HPI. ?Review of Systems ? ?Objective: ?Vital Signs: There were no vitals taken for this visit. ? ?Specialty Comments:  ?No specialty comments available. ? ?PMFS History: ?Patient Active Problem List  ? Diagnosis Date Noted  ? Pressure injury of skin 04/09/2021  ? SVT (supraventricular tachycardia) ([place]) 04/09/2021  ? Aspiration pneumonia ([place]) 04/08/2021  ? DKA, type 2 ([place]) 04/08/2021  ? Sepsis ([place]) 04/08/2021  ? AKI (acute kidney injury) ([place]) 04/08/2021  ? History of vitrectomy 09/14/2020  ? Stable treated proliferative diabetic retinopathy of right eye determined by examination associated with  type 2 diabetes mellitus ([place]) 09/14/2020  ? Stable treated proliferative diabetic retinopathy of left eye determined by examination associated with type 2 diabetes mellitus ([place]) 09/14/2020  ? Diabetic ulcer of left great toe ([place])   ? DM (diabetes mellitus), type 2 with peripheral vascular complications ([place]) 27/25/3664  ? HLD (hyperlipidemia) 06/03/2020  ? HTN (hypertension) 06/03/2020  ? Osteomyelitis of great toe of left foot ([place]) 06/03/2020  ? Cellulitis of left foot 06/03/2020  ? Atherosclerosis of native artery of left lower extremity with ulceration ([place]) 03/30/2020  ? DJD (degenerative joint disease) 04/21/2014  ? ?Past Medical History:  ?Diagnosis Date  ? Arthritis   ? OSTEO  ? Family history of adverse reaction to anesthesia   ? Mother -  N/V  ? Goiter   ? Hyperlipidemia   ? Hypertension   ? PONV (postoperative nausea and vomiting)   ? Retinopathy   ?  ?Family History  ?Problem Relation Age of Onset  ? Healthy Mother   ? Healthy Father   ?  ?Past Surgical History:  ?Procedure Laterality Date  ? ABDOMINAL AORTOGRAM W/LOWER EXTREMITY N/A 03/31/2020  ? Procedure: ABDOMINAL AORTOGRAM W/LOWER EXTREMITY;  Surgeon: Cherre Robins, MD;  Location: [facility];  Service: Cardiovascular;  Laterality: N/A;  ? ABDOMINAL HYSTERECTOMY    ? AIR/FLUID EXCHANGE Left 11/30/2014  ? Procedure: AIR/FLUID EXCHANGE LEFT EYE;  Surgeon: Hurman Horn, MD;  Location: [facility];  Service: Ophthalmology;  Laterality: Left;  ? AMPUTATION Left 06/11/2020  ? Procedure: LEFT GREAT TOE AMPUTATION;  Surgeon: Newt Minion, MD;  Location: [facility];  Service: Orthopedics;  Laterality: Left;  ? BACK SURGERY    ? CESAREAN SECTION    ? x 2  ? EYE SURGERY    ? INJECTION OF SILICONE OIL Right 07/04/4740  ? Procedure: INJECTION OF SILICONE OIL;  Surgeon: Hurman Horn, MD;  Location: [facility];  Service: Ophthalmology;  Laterality: Right;  ? INJECTION OF SILICONE OIL Left 5/95/6387  ? Procedure: INJECTION OF SILICONE OIL;  Surgeon: Hurman Horn, MD;  Location: [facility];  Service: Ophthalmology;  Laterality: Left;  ? LASER PHOTO ABLATION Right 05/26/2014  ? Procedure: LASER PHOTO ABLATION;  Surgeon: Hurman Horn, MD;  Location: [facility];  Service: Ophthalmology;  Laterality: Right;  ? PARS PLANA VITRECTOMY Right 05/26/2014  ? Procedure: PARS PLANA VITRECTOMY 25 GAUGE;  Surgeon: Hurman Horn, MD;  Location: [facility];  Service: Ophthalmology;  Laterality: Right;  ? PARS PLANA VITRECTOMY Left 11/30/2014  ? Procedure: PARS PLANA VITRECTOMY WITH 25 GAUGE with endolaser;  Surgeon: Hurman Horn, MD;  Location: [facility];  Service: Ophthalmology;  Laterality: Left;  ? PERIPHERAL VASCULAR INTERVENTION Left 03/31/2020  ? Procedure: PERIPHERAL VASCULAR INTERVENTION;  Surgeon: Cherre Robins, MD;  Location: [facility];  Service: Cardiovascular;  Laterality: Left;  superficial femoral  ? TONSILLECTOMY    ? TOTAL HIP ARTHROPLASTY Right 04/21/2014  ? dr Percell Miller  ? TOTAL HIP ARTHROPLASTY Right 04/21/2014  ? Procedure: RIGHT TOTAL HIP ARTHROPLASTY ANTERIOR APPROACH;  Surgeon: Renette Butters, MD;  Location: [facility];  Service: Orthopedics;  Laterality: Right;  ? ?Social History  ? ?Occupational History  ? Not on file  ?Tobacco Use  ? Smoking status: Former  ? Smokeless tobacco: Never  ? Tobacco comments:  ?  smoked as a teenager  ?Vaping Use  ? Vaping Use: Never used  ?Substance and Sexual Activity  ? Alcohol use: No  ?  Drug use: No  ? Sexual activity: Not on file  ? ? ? ? ? ?

## 2021-06-13 ENCOUNTER — Encounter (HOSPITAL_COMMUNITY): Payer: Self-pay

## 2021-06-20 ENCOUNTER — Other Ambulatory Visit: Payer: Self-pay | Admitting: Orthopedic Surgery

## 2021-06-20 ENCOUNTER — Telehealth: Payer: Self-pay | Admitting: Orthopedic Surgery

## 2021-06-20 DIAGNOSIS — I10 Essential (primary) hypertension: Secondary | ICD-10-CM | POA: Diagnosis not present

## 2021-06-20 DIAGNOSIS — E1165 Type 2 diabetes mellitus with hyperglycemia: Secondary | ICD-10-CM | POA: Diagnosis not present

## 2021-06-20 MED ORDER — DOXYCYCLINE HYCLATE 100 MG PO TABS: 100.0000 mg | ORAL_TABLET | Freq: Two times a day (BID) | ORAL | 0 refills | Status: DC

## 2021-06-20 NOTE — Telephone Encounter (Signed)
Pt called requesting antibiotics for swelling. Pt states Dr. Sharol Given stated if pt had any more swelling of any kind to call for a script of antibiotics. Please send to pharmacy on file. Pt phone number is [phone] ?

## 2021-06-20 NOTE — Telephone Encounter (Signed)
Pt was here on 06/09/21 for ulcers over her second and third toe on left foot. Please advise if okay for abx. ?

## 2021-06-21 NOTE — Telephone Encounter (Signed)
Pt informed

## 2021-06-23 ENCOUNTER — Other Ambulatory Visit: Payer: Medicare Other

## 2021-06-23 DIAGNOSIS — S90822A Blister (nonthermal), left foot, initial encounter: Secondary | ICD-10-CM | POA: Diagnosis not present

## 2021-06-23 DIAGNOSIS — I10 Essential (primary) hypertension: Secondary | ICD-10-CM | POA: Diagnosis not present

## 2021-06-23 DIAGNOSIS — E11319 Type 2 diabetes mellitus with unspecified diabetic retinopathy without macular edema: Secondary | ICD-10-CM | POA: Diagnosis not present

## 2021-06-23 DIAGNOSIS — E782 Mixed hyperlipidemia: Secondary | ICD-10-CM | POA: Diagnosis not present

## 2021-06-23 DIAGNOSIS — Z0001 Encounter for general adult medical examination with abnormal findings: Secondary | ICD-10-CM | POA: Diagnosis not present

## 2021-06-23 DIAGNOSIS — I471 Supraventricular tachycardia: Secondary | ICD-10-CM | POA: Diagnosis not present

## 2021-06-23 DIAGNOSIS — Z89429 Acquired absence of other toe(s), unspecified side: Secondary | ICD-10-CM | POA: Diagnosis not present

## 2021-06-23 DIAGNOSIS — E048 Other specified nontoxic goiter: Secondary | ICD-10-CM | POA: Diagnosis not present

## 2021-06-23 DIAGNOSIS — H3500 Unspecified background retinopathy: Secondary | ICD-10-CM | POA: Diagnosis not present

## 2021-07-07 ENCOUNTER — Encounter: Payer: Self-pay | Admitting: Orthopedic Surgery

## 2021-07-07 ENCOUNTER — Ambulatory Visit: Payer: Medicare Other | Admitting: Orthopedic Surgery

## 2021-07-07 DIAGNOSIS — M869 Osteomyelitis, unspecified: Secondary | ICD-10-CM | POA: Diagnosis not present

## 2021-07-07 NOTE — Progress Notes (Signed)
? ?Office Visit Note ?  ?Patient: [name]           ?Date of Birth: 10-21-1955           ?MRN: [id number] ?Visit Date: 07/07/2021 ?             ?Requested by: Celene Squibb, MD ?[address] ?[name] ?[place],  [place] [address] ?PCP: Celene Squibb, MD ? ?Chief Complaint  ?Patient presents with  ? Left Foot - Follow-up  ? ? ? ? ?HPI: ?Patient is a 66 year old woman who is seen in follow-up for ulceration plantar aspect left second and third toes status post great toe amputation.  Patient is wearing protective socks stiff soled sneakers states she has noticed some increased swelling redness and drainage from the third toe. ? ?Assessment & Plan: ?Visit Diagnoses:  ?1. Osteomyelitis of third toe of left foot ([place])   ? ? ?Plan: With the ulcer that probes to bone sausage digit swelling and cellulitis I recommended proceeding with amputation of the second and third toes at the MTP joint.  We will plan for surgery as outpatient.  Patient states she understands wished to proceed with surgery. ? ?Follow-Up Instructions: No follow-ups on file.  ? ?Ortho Exam ? ?Patient is alert, oriented, no adenopathy, well-dressed, normal affect, normal respiratory effort. ?Examination patient has a palpable dorsalis pedis pulse.  The great toe amputation site is healed well.  She has increased redness swelling of the second and third toes.  After informed consent a 10 blade knife was used to debride the skin and soft tissue from the ulcers on the plantar aspect of the clawed toes.  The ulcer on the second toe is superficial 10 mm in diameter 1 mm deep after debridement this does not probe to bone.  After debridement of the third toe the ulcer is 10 mm in diameter and 10 mm deep and probes down to bone consistent with osteomyelitis. ? ?Imaging: ?No results found. ?No images are attached to the encounter. ? ?Labs: ?Lab Results  ?Component Value Date  ? HGBA1C 7.4 (H) 04/08/2021  ? HGBA1C 7.2 (H) 06/03/2020  ? ? ? ?Lab Results  ?Component  Value Date  ? ALBUMIN 3.6 06/03/2020  ? ALBUMIN 3.2 (L) 04/20/2019  ? ? ?Lab Results  ?Component Value Date  ? MG 2.1 04/11/2021  ? MG 2.0 04/10/2021  ? MG 2.0 04/09/2021  ? ?No results found for: VD25OH ? ?No results found for: PREALBUMIN ? ?  Latest Ref Rng & Units 04/11/2021  ?  4:06 AM 04/10/2021  ?  5:33 AM 04/09/2021  ?  4:04 AM  ?CBC EXTENDED  ?WBC 4.0 - 10.5 K/uL 12.3   17.8   20.2    ?RBC 3.87 - 5.11 MIL/uL 4.22   4.11   4.27    ?Hemoglobin 12.0 - 15.0 g/dL 12.8   12.5   12.7    ?HCT 36.0 - 46.0 % 37.7   37.4   38.8    ?Platelets 150 - 400 K/uL 372   356   334    ?NEUT# 1.7 - 7.7 K/uL 8.9   15.1   18.4    ?Lymph# 0.7 - 4.0 K/uL 2.0   1.5   0.8    ? ? ? ?There is no height or weight on file to calculate BMI. ? ?Orders:  ?No orders of the defined types were placed in this encounter. ? ?No orders of the defined types were placed in this  encounter. ? ? ? Procedures: ?No procedures performed ? ?Clinical Data: ?No additional findings. ? ?ROS: ? ?All other systems negative, except as noted in the HPI. ?Review of Systems ? ?Objective: ?Vital Signs: There were no vitals taken for this visit. ? ?Specialty Comments:  ?No specialty comments available. ? ?PMFS History: ?Patient Active Problem List  ? Diagnosis Date Noted  ? Pressure injury of skin 04/09/2021  ? SVT (supraventricular tachycardia) ([place]) 04/09/2021  ? Aspiration pneumonia ([place]) 04/08/2021  ? DKA, type 2 ([place]) 04/08/2021  ? Sepsis ([place]) 04/08/2021  ? AKI (acute kidney injury) ([place]) 04/08/2021  ? History of vitrectomy 09/14/2020  ? Stable treated proliferative diabetic retinopathy of right eye determined by examination associated with type 2 diabetes mellitus ([place]) 09/14/2020  ? Stable treated proliferative diabetic retinopathy of left eye determined by examination associated with type 2 diabetes mellitus ([place]) 09/14/2020  ? Diabetic ulcer of left great toe ([place])   ? DM (diabetes mellitus), type 2 with peripheral vascular complications ([place]) 70/04/7492  ? HLD  (hyperlipidemia) 06/03/2020  ? HTN (hypertension) 06/03/2020  ? Osteomyelitis of great toe of left foot ([place]) 06/03/2020  ? Cellulitis of left foot 06/03/2020  ? Atherosclerosis of native artery of left lower extremity with ulceration ([place]) 03/30/2020  ? DJD (degenerative joint disease) 04/21/2014  ? ?Past Medical History:  ?Diagnosis Date  ? Arthritis   ? OSTEO  ? Family history of adverse reaction to anesthesia   ? Mother - N/V  ? Goiter   ? Hyperlipidemia   ? Hypertension   ? PONV (postoperative nausea and vomiting)   ? Retinopathy   ?  ?Family History  ?Problem Relation Age of Onset  ? Healthy Mother   ? Healthy Father   ?  ?Past Surgical History:  ?Procedure Laterality Date  ? ABDOMINAL AORTOGRAM W/LOWER EXTREMITY N/A 03/31/2020  ? Procedure: ABDOMINAL AORTOGRAM W/LOWER EXTREMITY;  Surgeon: Cherre Robins, MD;  Location: [facility];  Service: Cardiovascular;  Laterality: N/A;  ? ABDOMINAL HYSTERECTOMY    ? AIR/FLUID EXCHANGE Left 11/30/2014  ? Procedure: AIR/FLUID EXCHANGE LEFT EYE;  Surgeon: Hurman Horn, MD;  Location: [facility];  Service: Ophthalmology;  Laterality: Left;  ? AMPUTATION Left 06/11/2020  ? Procedure: LEFT GREAT TOE AMPUTATION;  Surgeon: Newt Minion, MD;  Location: [facility];  Service: Orthopedics;  Laterality: Left;  ? BACK SURGERY    ? CESAREAN SECTION    ? x 2  ? EYE SURGERY    ? INJECTION OF SILICONE OIL Right 4/96/7591  ? Procedure: INJECTION OF SILICONE OIL;  Surgeon: Hurman Horn, MD;  Location: [facility];  Service: Ophthalmology;  Laterality: Right;  ? INJECTION OF SILICONE OIL Left 6/38/4665  ? Procedure: INJECTION OF SILICONE OIL;  Surgeon: Hurman Horn, MD;  Location: [facility];  Service: Ophthalmology;  Laterality: Left;  ? LASER PHOTO ABLATION Right 05/26/2014  ? Procedure: LASER PHOTO ABLATION;  Surgeon: Hurman Horn, MD;  Location: [facility];  Service: Ophthalmology;  Laterality: Right;  ? PARS PLANA VITRECTOMY Right 05/26/2014  ? Procedure: PARS PLANA VITRECTOMY 25 GAUGE;  Surgeon:  Hurman Horn, MD;  Location: [facility];  Service: Ophthalmology;  Laterality: Right;  ? PARS PLANA VITRECTOMY Left 11/30/2014  ? Procedure: PARS PLANA VITRECTOMY WITH 25 GAUGE with endolaser;  Surgeon: Hurman Horn, MD;  Location: [facility];  Service: Ophthalmology;  Laterality: Left;  ? PERIPHERAL VASCULAR INTERVENTION Left 03/31/2020  ? Procedure: PERIPHERAL VASCULAR INTERVENTION;  Surgeon: Jamelle Haring  N, MD;  Location: [facility];  Service: Cardiovascular;  Laterality: Left;  superficial femoral  ? TONSILLECTOMY    ? TOTAL HIP ARTHROPLASTY Right 04/21/2014  ? dr Percell Miller  ? TOTAL HIP ARTHROPLASTY Right 04/21/2014  ? Procedure: RIGHT TOTAL HIP ARTHROPLASTY ANTERIOR APPROACH;  Surgeon: Renette Butters, MD;  Location: [facility];  Service: Orthopedics;  Laterality: Right;  ? ?Social History  ? ?Occupational History  ? Not on file  ?Tobacco Use  ? Smoking status: Former  ? Smokeless tobacco: Never  ? Tobacco comments:  ?  smoked as a teenager  ?Vaping Use  ? Vaping Use: Never used  ?Substance and Sexual Activity  ? Alcohol use: No  ? Drug use: No  ? Sexual activity: Not on file  ? ? ? ? ? ?

## 2021-07-08 ENCOUNTER — Encounter (HOSPITAL_COMMUNITY): Payer: Self-pay | Admitting: Orthopedic Surgery

## 2021-07-08 ENCOUNTER — Other Ambulatory Visit: Payer: Self-pay

## 2021-07-08 NOTE — Progress Notes (Signed)
PCP - Wende Neighbors, MD Linna Hoff, [place] ?Cardiologist - denies ? ?PPM/ICD - denies ? ? ?Chest x-ray - 04/08/21 ?EKG - 04/11/21 ?Stress Test - denies ?ECHO - denies ?Cardiac Cath - denies ? ?Sleep Study - denies ? ?DM- Type 2 ?Fasting Blood Sugar - 130-150 ?Checks Blood Sugar once a day ? ?Blood Thinner Instructions: n/a ?Aspirin Instructions: instructed to call office, hold otherwise ? ?ERAS Protcol - yes, no drink  ? ? ?COVID TEST- n/a ? ? ?Anesthesia review: no ? ?Patient denies shortness of breath, fever, cough and chest pain on phone call ? ? ?All instructions explained to the patient, with a verbal understanding of the material. Patient agrees to go over the instructions while at home for a better understanding. Patient also instructed to notify surgeon of any contact with COVID+ person or if she develops any symptoms. The opportunity to ask questions was provided. ?  ?

## 2021-07-11 ENCOUNTER — Telehealth: Payer: Self-pay | Admitting: Orthopedic Surgery

## 2021-07-11 DIAGNOSIS — H04123 Dry eye syndrome of bilateral lacrimal glands: Secondary | ICD-10-CM | POA: Diagnosis not present

## 2021-07-11 NOTE — Telephone Encounter (Signed)
Pt called requesting a call back about medication she need to stop taking before her surgery this week. Please call pt at [phone] or [phone]. ?

## 2021-07-11 NOTE — Telephone Encounter (Signed)
I tried to call pt no answer will hold and try again later.  ?

## 2021-07-11 NOTE — Telephone Encounter (Signed)
Pt called and states hospital told her she could only take 1 Glipizide in the sm on Tuesday and not to take her Synjardy XR at all because of anesthesia pt states she was not put to sleep last surgery and wonders if she will be on Wednesday also not taking the Synjardy will cause her BS to spike the day prior. She aslo questions if she needs to stop her BASA please advise.   ?

## 2021-07-12 NOTE — Telephone Encounter (Signed)
Pt informed of instructions.

## 2021-07-13 ENCOUNTER — Other Ambulatory Visit: Payer: Self-pay

## 2021-07-13 ENCOUNTER — Ambulatory Visit (HOSPITAL_COMMUNITY)
Admission: RE | Admit: 2021-07-13 | Discharge: 2021-07-13 | Disposition: A | Discharge status: Home / Self Care | Payer: Medicare Other | Attending: Orthopedic Surgery | Admitting: Orthopedic Surgery

## 2021-07-13 ENCOUNTER — Ambulatory Visit (HOSPITAL_BASED_OUTPATIENT_CLINIC_OR_DEPARTMENT_OTHER): Payer: Medicare Other | Admitting: Certified Registered"

## 2021-07-13 ENCOUNTER — Encounter (HOSPITAL_COMMUNITY)
Admission: RE | Disposition: A | Discharge status: Home / Self Care | Payer: Self-pay | Source: Home / Self Care | Attending: Orthopedic Surgery

## 2021-07-13 ENCOUNTER — Encounter (HOSPITAL_COMMUNITY): Payer: Self-pay | Admitting: Orthopedic Surgery

## 2021-07-13 ENCOUNTER — Ambulatory Visit (HOSPITAL_COMMUNITY): Payer: Medicare Other | Admitting: Certified Registered"

## 2021-07-13 DIAGNOSIS — I10 Essential (primary) hypertension: Secondary | ICD-10-CM

## 2021-07-13 DIAGNOSIS — M869 Osteomyelitis, unspecified: Secondary | ICD-10-CM | POA: Insufficient documentation

## 2021-07-13 DIAGNOSIS — E1169 Type 2 diabetes mellitus with other specified complication: Secondary | ICD-10-CM

## 2021-07-13 DIAGNOSIS — Z89412 Acquired absence of left great toe: Secondary | ICD-10-CM | POA: Insufficient documentation

## 2021-07-13 DIAGNOSIS — E1151 Type 2 diabetes mellitus with diabetic peripheral angiopathy without gangrene: Secondary | ICD-10-CM

## 2021-07-13 DIAGNOSIS — Z87891 Personal history of nicotine dependence: Secondary | ICD-10-CM | POA: Insufficient documentation

## 2021-07-13 HISTORY — PX: AMPUTATION: SHX166

## 2021-07-13 LAB — CBC
HCT: 44.4 % (ref 36.0–46.0)
Hemoglobin: 14.5 g/dL (ref 12.0–15.0)
MCH: 30.1 pg (ref 26.0–34.0)
MCHC: 32.7 g/dL (ref 30.0–36.0)
MCV: 92.1 fL (ref 80.0–100.0)
Platelets: 253 10*3/uL (ref 150–400)
RBC: 4.82 MIL/uL (ref 3.87–5.11)
RDW: 13.8 % (ref 11.5–15.5)
WBC: 7.5 10*3/uL (ref 4.0–10.5)
nRBC: 0 % (ref 0.0–0.2)

## 2021-07-13 LAB — BASIC METABOLIC PANEL
Anion gap: 11 (ref 5–15)
BUN: 26 mg/dL — ABNORMAL HIGH (ref 8–23)
CO2: 22 mmol/L (ref 22–32)
Calcium: 9.6 mg/dL (ref 8.9–10.3)
Chloride: 107 mmol/L (ref 98–111)
Creatinine, Ser: 0.83 mg/dL (ref 0.44–1.00)
GFR, Estimated: >60 mL/min (ref >60)
Glucose, Bld: 165 mg/dL — ABNORMAL HIGH (ref 70–99)
Potassium: 4.4 mmol/L (ref 3.5–5.1)
Sodium: 140 mmol/L (ref 135–145)

## 2021-07-13 LAB — GLUCOSE, CAPILLARY
Glucose-Capillary: 150 mg/dL — ABNORMAL HIGH (ref 70–99)
Glucose-Capillary: 122 mg/dL — ABNORMAL HIGH (ref 70–99)
Glucose-Capillary: 124 mg/dL — ABNORMAL HIGH (ref 70–99)

## 2021-07-13 SURGERY — AMPUTATION DIGIT
Anesthesia: General | Site: Toe | Laterality: Left

## 2021-07-13 MED ORDER — OXYCODONE HCL 5 MG PO TABS: 5.0000 mg | ORAL_TABLET | Freq: Once | ORAL | Status: DC | PRN

## 2021-07-13 MED ORDER — CEFAZOLIN SODIUM-DEXTROSE 2-4 GM/100ML-% IV SOLN
2.0000 g | INTRAVENOUS | Status: AC
  Administered 2021-07-13: 2 g via INTRAVENOUS
  Filled 2021-07-13: qty 100

## 2021-07-13 MED ORDER — PROMETHAZINE HCL 25 MG/ML IJ SOLN: 6.2500 mg | INTRAMUSCULAR | Status: DC | PRN

## 2021-07-13 MED ORDER — 0.9 % SODIUM CHLORIDE (POUR BTL) OPTIME
TOPICAL | Status: DC | PRN
  Administered 2021-07-13: 1000 mL

## 2021-07-13 MED ORDER — INSULIN ASPART 100 UNIT/ML IJ SOLN
INTRAMUSCULAR | Status: AC
  Administered 2021-07-13: 2 [IU] via SUBCUTANEOUS
  Filled 2021-07-13: qty 1

## 2021-07-13 MED ORDER — MIDAZOLAM HCL 2 MG/2ML IJ SOLN
INTRAMUSCULAR | Status: AC
  Filled 2021-07-13: qty 2

## 2021-07-13 MED ORDER — PROPOFOL 10 MG/ML IV BOLUS
INTRAVENOUS | Status: DC | PRN
  Administered 2021-07-13: 100 ug/kg/min via INTRAVENOUS

## 2021-07-13 MED ORDER — LACTATED RINGERS IV SOLN: INTRAVENOUS | Status: DC

## 2021-07-13 MED ORDER — ONDANSETRON HCL 4 MG/2ML IJ SOLN
INTRAMUSCULAR | Status: DC | PRN
  Administered 2021-07-13: 4 mg via INTRAVENOUS

## 2021-07-13 MED ORDER — HYDROMORPHONE HCL 1 MG/ML IJ SOLN: 0.2500 mg | INTRAMUSCULAR | Status: DC | PRN

## 2021-07-13 MED ORDER — OXYCODONE HCL 5 MG/5ML PO SOLN: 5.0000 mg | Freq: Once | ORAL | Status: DC | PRN

## 2021-07-13 MED ORDER — BUPIVACAINE-EPINEPHRINE (PF) 0.25% -1:200000 IJ SOLN
INTRAMUSCULAR | Status: DC | PRN
  Administered 2021-07-13: 10 mL

## 2021-07-13 MED ORDER — ORAL CARE MOUTH RINSE: 15.0000 mL | Freq: Once | OROMUCOSAL | Status: AC

## 2021-07-13 MED ORDER — METOPROLOL TARTRATE 12.5 MG HALF TABLET
25.0000 mg | ORAL_TABLET | Freq: Once | ORAL | Status: AC
  Administered 2021-07-13: 25 mg via ORAL

## 2021-07-13 MED ORDER — INSULIN ASPART 100 UNIT/ML IJ SOLN: 0.0000 [IU] | INTRAMUSCULAR | Status: DC | PRN

## 2021-07-13 MED ORDER — FENTANYL CITRATE (PF) 100 MCG/2ML IJ SOLN
INTRAMUSCULAR | Status: AC
  Filled 2021-07-13: qty 2

## 2021-07-13 MED ORDER — BUPIVACAINE HCL (PF) 0.25 % IJ SOLN
INTRAMUSCULAR | Status: AC
  Filled 2021-07-13: qty 30

## 2021-07-13 MED ORDER — CHLORHEXIDINE GLUCONATE 0.12 % MT SOLN
15.0000 mL | Freq: Once | OROMUCOSAL | Status: AC
  Administered 2021-07-13: 15 mL via OROMUCOSAL
  Filled 2021-07-13: qty 15

## 2021-07-13 MED ORDER — METOPROLOL TARTRATE 12.5 MG HALF TABLET
ORAL_TABLET | ORAL | Status: AC
  Filled 2021-07-13: qty 2

## 2021-07-13 SURGICAL SUPPLY — 30 items
BAG COUNTER SPONGE SURGICOUNT (BAG) ×2 IMPLANT
BLADE SURG 21 STRL SS (BLADE) ×2 IMPLANT
BNDG COHESIVE 4X5 TAN ST LF (GAUZE/BANDAGES/DRESSINGS) ×1 IMPLANT
BNDG COHESIVE 4X5 TAN STRL (GAUZE/BANDAGES/DRESSINGS) ×2 IMPLANT
BNDG ESMARK 4X9 LF (GAUZE/BANDAGES/DRESSINGS) IMPLANT
BNDG GAUZE ELAST 4 BULKY (GAUZE/BANDAGES/DRESSINGS) ×2 IMPLANT
COVER SURGICAL LIGHT HANDLE (MISCELLANEOUS) ×4 IMPLANT
DRAPE U-SHAPE 47X51 STRL (DRAPES) ×2 IMPLANT
DRSG ADAPTIC 3X8 NADH LF (GAUZE/BANDAGES/DRESSINGS) ×1 IMPLANT
DRSG PAD ABDOMINAL 8X10 ST (GAUZE/BANDAGES/DRESSINGS) ×2 IMPLANT
DURAPREP 26ML APPLICATOR (WOUND CARE) ×2 IMPLANT
ELECT REM PT RETURN 9FT ADLT (ELECTROSURGICAL) ×2
ELECTRODE REM PT RTRN 9FT ADLT (ELECTROSURGICAL) ×1 IMPLANT
GAUZE SPONGE 4X4 12PLY STRL (GAUZE/BANDAGES/DRESSINGS) ×1 IMPLANT
GLOVE BIOGEL PI IND STRL 9 (GLOVE) ×1 IMPLANT
GLOVE BIOGEL PI INDICATOR 9 (GLOVE) ×1
GLOVE SURG ORTHO 9.0 STRL STRW (GLOVE) ×2 IMPLANT
GOWN STRL REUS W/ TWL XL LVL3 (GOWN DISPOSABLE) ×2 IMPLANT
GOWN STRL REUS W/TWL XL LVL3 (GOWN DISPOSABLE) ×4
KIT BASIN OR (CUSTOM PROCEDURE TRAY) ×2 IMPLANT
KIT TURNOVER KIT B (KITS) ×2 IMPLANT
MANIFOLD NEPTUNE II (INSTRUMENTS) ×2 IMPLANT
NEEDLE 22X1 1/2 (OR ONLY) (NEEDLE) IMPLANT
NS IRRIG 1000ML POUR BTL (IV SOLUTION) ×2 IMPLANT
PACK ORTHO EXTREMITY (CUSTOM PROCEDURE TRAY) ×2 IMPLANT
PAD ABD 8X10 STRL (GAUZE/BANDAGES/DRESSINGS) ×1 IMPLANT
PAD ARMBOARD 7.5X6 YLW CONV (MISCELLANEOUS) ×4 IMPLANT
SUT ETHILON 2 0 PSLX (SUTURE) ×2 IMPLANT
SYR CONTROL 10ML LL (SYRINGE) IMPLANT
TOWEL GREEN STERILE (TOWEL DISPOSABLE) ×2 IMPLANT

## 2021-07-13 NOTE — Anesthesia Postprocedure Evaluation (Signed)
Anesthesia Post Note ? ?Patient: [name] ? ?Procedure(s) Performed: LEFT SECOND AND THIRD TOE AMPUTATION (Left: Toe) ? ?  ? ?Patient location during evaluation: PACU ?Anesthesia Type: MAC ?Level of consciousness: awake and alert ?Pain management: pain level controlled ?Vital Signs Assessment: post-procedure vital signs reviewed and stable ?Respiratory status: spontaneous breathing, nonlabored ventilation and respiratory function stable ?Cardiovascular status: blood pressure returned to baseline and stable ?Postop Assessment: no apparent nausea or vomiting ?Anesthetic complications: no ? ? ?No notable events documented. ? ?Last Vitals:  ?Vitals:  ? 07/13/21 1131 07/13/21 1146  ?BP: 127/65 134/72  ?Pulse: 74 74  ?Resp: 18 15  ?Temp:  36.7 ?C  ?SpO2: 99% 100%  ?  ?Last Pain:  ?Vitals:  ? 07/13/21 1146  ?TempSrc:   ?PainSc: 0-No pain  ? ? ?  ?  ?  ?  ?  ?  ? ?Lynda Rainwater ? ? ? ? ?

## 2021-07-13 NOTE — Progress Notes (Signed)
MD Sharol Given informed of bloody drainage from surgical site, instructed this RN to reinforce dressing. Patient instructed to call MD office if bleeding persists.  ?

## 2021-07-13 NOTE — Interval H&P Note (Signed)
History and Physical Interval Note: ? ?07/13/2021 ?1:34 PM ? ?[name]  has presented today for surgery, with the diagnosis of Osteomyelitis Left 2nd and 3rd toes.  The various methods of treatment have been discussed with the patient and family. After consideration of risks, benefits and other options for treatment, the patient has consented to  Procedure(s): ?LEFT SECOND AND THIRD TOE AMPUTATION (Left) as a surgical intervention.  The patient's history has been reviewed, patient examined, no change in status, stable for surgery.  I have reviewed the patient's chart and labs.  Questions were answered to the patient's satisfaction.   ? ? ?Newt Minion ? ? ?

## 2021-07-13 NOTE — Op Note (Signed)
07/13/2021 ? ?11:14 AM ? ?PATIENT:  [name]   ? ?PRE-OPERATIVE DIAGNOSIS:  Osteomyelitis Left 2nd and 3rd toes ? ?POST-OPERATIVE DIAGNOSIS:  Same ? ?PROCEDURE:  LEFT SECOND AND THIRD TOE AMPUTATION ? ?SURGEON:  Newt Minion, MD ? ?PHYSICIAN ASSISTANT:None ?ANESTHESIA:   General ? ?PREOPERATIVE INDICATIONS:  [name] is a  66 y.o. female with a diagnosis of Osteomyelitis Left 2nd and 3rd toes who failed conservative measures and elected for surgical management.   ? ?The risks benefits and alternatives were discussed with the patient preoperatively including but not limited to the risks of infection, bleeding, nerve injury, cardiopulmonary complications, the need for revision surgery, among others, and the patient was willing to proceed. ? ?OPERATIVE IMPLANTS: None ? ?_0 @ ? ?OPERATIVE FINDINGS: Good petechial bleeding ? ?OPERATIVE PROCEDURE: Patient was brought the operating room and underwent a MAC anesthetic.  The left lower extremity was then prepped using DuraPrep draped into a sterile field a timeout was called.  Patient underwent a regional local anesthesia with 10 cc of quarter percent Marcaine plain.  After adequate levels anesthesia obtained a fishmouth incision was just made distal to the MTP joint of the second and third toe.  The second and third toe were resected through the MTP joint.  Electrocautery was used hemostasis wound was irrigated normal saline the incision was closed using 2-0 nylon a sterile dressing was applied patient was taken the PACU in stable condition. ? ? ?DISCHARGE PLANNING: ? ?Antibiotic duration: Preoperative antibiotics no signs of infection at the amputation site ? ?Weightbearing: Weightbearing as tolerated ? ?Pain medication: Patient has prescriptions for pain medicine at home ? ?Dressing care/ Wound VAC: Dry dressing reinforce as needed ? ?Ambulatory devices: Walker ? ?Discharge to: Home. ? ?Follow-up: In the office 1 week post operative. ? ? ? ? ? ? ?   ?

## 2021-07-13 NOTE — H&P (Signed)
[name] is an 66 y.o. female.   ?Chief Complaint: Swelling cellulitis and ulceration left foot second and third toes ?HPI: Patient is a 66 year old woman who is seen in follow-up for ulceration plantar aspect left second and third toes status post great toe amputation.  Patient is wearing protective socks stiff soled sneakers states she has noticed some increased swelling redness and drainage from the third toe.Patient has type 2 diabetes and status post amputation of the left great toe secondary to abscess and osteomyelitis. ? ?Past Medical History:  ?Diagnosis Date  ? Arthritis   ? OSTEO  ? Diabetes mellitus without complication ([place])   ? type 2  ? Family history of adverse reaction to anesthesia   ? Mother - N/V  ? Goiter   ? Hyperlipidemia   ? Hypertension   ? PONV (postoperative nausea and vomiting)   ? Retinopathy   ? ? ?Past Surgical History:  ?Procedure Laterality Date  ? ABDOMINAL AORTOGRAM W/LOWER EXTREMITY N/A 03/31/2020  ? Procedure: ABDOMINAL AORTOGRAM W/LOWER EXTREMITY;  Surgeon: Cherre Robins, MD;  Location: [facility];  Service: Cardiovascular;  Laterality: N/A;  ? ABDOMINAL HYSTERECTOMY    ? AIR/FLUID EXCHANGE Left 11/30/2014  ? Procedure: AIR/FLUID EXCHANGE LEFT EYE;  Surgeon: Hurman Horn, MD;  Location: [facility];  Service: Ophthalmology;  Laterality: Left;  ? AMPUTATION Left 06/11/2020  ? Procedure: LEFT GREAT TOE AMPUTATION;  Surgeon: Newt Minion, MD;  Location: [facility];  Service: Orthopedics;  Laterality: Left;  ? BACK SURGERY    ? CESAREAN SECTION    ? x 2  ? EYE SURGERY    ? INJECTION OF SILICONE OIL Right 37/90/2409  ? Procedure: INJECTION OF SILICONE OIL;  Surgeon: Hurman Horn, MD;  Location: [facility];  Service: Ophthalmology;  Laterality: Right;  ? INJECTION OF SILICONE OIL Left 73/53/2992  ? Procedure: INJECTION OF SILICONE OIL;  Surgeon: Hurman Horn, MD;  Location: [facility];  Service: Ophthalmology;  Laterality: Left;  ? LASER PHOTO ABLATION Right 05/26/2014  ?  Procedure: LASER PHOTO ABLATION;  Surgeon: Hurman Horn, MD;  Location: [facility];  Service: Ophthalmology;  Laterality: Right;  ? PARS PLANA VITRECTOMY Right 05/26/2014  ? Procedure: PARS PLANA VITRECTOMY 25 GAUGE;  Surgeon: Hurman Horn, MD;  Location: [facility];  Service: Ophthalmology;  Laterality: Right;  ? PARS PLANA VITRECTOMY Left 11/30/2014  ? Procedure: PARS PLANA VITRECTOMY WITH 25 GAUGE with endolaser;  Surgeon: Hurman Horn, MD;  Location: [facility];  Service: Ophthalmology;  Laterality: Left;  ? PERIPHERAL VASCULAR INTERVENTION Left 03/31/2020  ? Procedure: PERIPHERAL VASCULAR INTERVENTION;  Surgeon: Cherre Robins, MD;  Location: [facility];  Service: Cardiovascular;  Laterality: Left;  superficial femoral  ? TONSILLECTOMY    ? TOTAL HIP ARTHROPLASTY Right 04/21/2014  ? dr Percell Miller  ? TOTAL HIP ARTHROPLASTY Right 04/21/2014  ? Procedure: RIGHT TOTAL HIP ARTHROPLASTY ANTERIOR APPROACH;  Surgeon: Renette Butters, MD;  Location: [facility];  Service: Orthopedics;  Laterality: Right;  ? TUBAL LIGATION    ? ? ?Family History  ?Problem Relation Age of Onset  ? Healthy Mother   ? Healthy Father   ? ?Social History:  reports that she has quit smoking. She has never used smokeless tobacco. She reports current alcohol use. She reports that she does not use drugs. ? ?Allergies:  ?Allergies  ?Allergen Reactions  ? Augmentin [Amoxicillin-Pot Clavulanate] Other (See Comments)  ?  Severe GI upset  ?  Anesthetics, Amide Nausea And Vomiting  ? Codeine Nausea And Vomiting  ? ? ?No medications prior to admission.  ? ? ?No results found for this or any previous visit (from the past 48 hour(s)). ?No results found. ? ?Review of Systems  ?All other systems reviewed and are negative. ? ?Height '5\' 6"'$  (1.676 m), weight 74.8 kg. ?Physical Exam  ?Patient is alert, oriented, no adenopathy, well-dressed, normal affect, normal respiratory effort. ?Examination patient has a palpable dorsalis pedis pulse.  The great toe amputation site is  healed well.  She has increased redness swelling of the second and third toes.  After intraoffice debridement patient had an ulcer on the third toe that had exposed bone with no exposed bone on the second toe.  Patient has sausage digit swelling of the second and third toes. ?Assessment/Plan ?1. Osteomyelitis of third toe of left foot ([place])   ?  ?  ?Plan: With the ulcer that probes to bone sausage digit swelling and cellulitis I recommended proceeding with amputation of the second and third toes at the MTP joint.  We will plan for surgery as outpatient.  Patient states she understands wished to proceed with surgery. ? ?Newt Minion, MD ?07/13/2021, 6:41 AM ? ? ? ?

## 2021-07-13 NOTE — Transfer of Care (Signed)
Immediate Anesthesia Transfer of Care Note ? ?Patient: [name] ? ?Procedure(s) Performed: LEFT SECOND AND THIRD TOE AMPUTATION (Left: Toe) ? ?Patient Location: PACU ? ?Anesthesia Type:General ? ?Level of Consciousness: awake, alert , oriented and patient cooperative ? ?Airway & Oxygen Therapy: Patient Spontanous Breathing and Patient connected to face mask oxygen ? ?Post-op Assessment: Report given to RN, Post -op Vital signs reviewed and stable and Patient moving all extremities X 4 ? ?Post vital signs: Reviewed and stable ? ?Last Vitals:  ?Vitals Value Taken Time  ?BP    ?Temp    ?Pulse 76 07/13/21 1116  ?Resp 21 07/13/21 1116  ?SpO2 100 % 07/13/21 1116  ?Vitals shown include unvalidated device data. ? ?Last Pain:  ?Vitals:  ? 07/13/21 0758  ?TempSrc:   ?PainSc: 0-No pain  ?   ? ?  ? ?Complications: No notable events documented. ?

## 2021-07-13 NOTE — Progress Notes (Signed)
Orthopedic Tech Progress Note ?Patient Details:  ?[name] ?1955-10-11 ?[id number] ? ?PACU RN called requesting a POST OP SHOE for patient  ? ?Ortho Devices ?Type of Ortho Device: Postop shoe/boot ?Ortho Device/Splint Location: LLE ?Ortho Device/Splint Interventions: Ordered, Application ?  ?Post Interventions ?Patient Tolerated: Well ?Instructions Provided: Care of device ? ?Janit Pagan ?07/13/2021, 11:54 AM ? ?

## 2021-07-13 NOTE — Anesthesia Preprocedure Evaluation (Addendum)
Anesthesia Evaluation  ?Patient identified by MRN, date of birth, ID band ?Patient awake ? ? ? ?Reviewed: ?Allergy & Precautions, NPO status , Patient's Chart, lab work & pertinent test results ? ?History of Anesthesia Complications ?(+) PONV and history of anesthetic complications ? ?Airway ?Mallampati: II ? ?TM Distance: >3 FB ?Neck ROM: Full ? ? ? Dental ?no notable dental hx. ? ?  ?Pulmonary ?neg pulmonary ROS, former smoker,  ?  ?Pulmonary exam normal ?breath sounds clear to auscultation ? ? ? ? ? ? Cardiovascular ?hypertension, Pt. on medications ?+ Peripheral Vascular Disease  ?Normal cardiovascular exam ?Rhythm:Regular Rate:Normal ? ? ?  ?Neuro/Psych ?negative neurological ROS ? negative psych ROS  ? GI/Hepatic ?negative GI ROS, Neg liver ROS,   ?Endo/Other  ?diabetes, Type 2 ? Renal/GU ?negative Renal ROS  ?negative genitourinary ?  ?Musculoskeletal ? ?(+) Arthritis , Osteoarthritis,   ? Abdominal ?  ?Peds ?negative pediatric ROS ?(+)  Hematology ?negative hematology ROS ?(+)   ?Anesthesia Other Findings ? ? Reproductive/Obstetrics ?negative OB ROS ? ?  ? ? ? ? ? ? ? ? ? ? ? ? ? ?  ?  ? ? ? ? ? ? ? ? ?Anesthesia Physical ? ?Anesthesia Plan ? ?ASA: III ? ?Anesthesia Plan: MAC  ? ?Post-op Pain Management:   ? ?Induction: Intravenous ? ?PONV Risk Score and Plan: 3 and Ondansetron, Dexamethasone, Midazolam and Treatment may vary due to age or medical condition ? ?Airway Management Planned: Simple Face Mask ? ?Additional Equipment:  ? ?Intra-op Plan:  ? ?Post-operative Plan:  ? ?Informed Consent: I have reviewed the patients History and Physical, chart, labs and discussed the procedure including the risks, benefits and alternatives for the proposed anesthesia with the patient or authorized representative who has indicated his/her understanding and acceptance.  ? ? ? ?Dental advisory given ? ?Plan Discussed with: CRNA and Surgeon ? ?Anesthesia Plan Comments:   ? ? ? ? ?Anesthesia  Quick Evaluation ? ?

## 2021-07-14 ENCOUNTER — Encounter (HOSPITAL_COMMUNITY): Payer: Self-pay | Admitting: Orthopedic Surgery

## 2021-07-20 ENCOUNTER — Ambulatory Visit (INDEPENDENT_AMBULATORY_CARE_PROVIDER_SITE_OTHER): Payer: Medicare Other | Admitting: Family

## 2021-07-20 ENCOUNTER — Encounter: Payer: Self-pay | Admitting: Family

## 2021-07-20 DIAGNOSIS — M869 Osteomyelitis, unspecified: Secondary | ICD-10-CM

## 2021-07-20 NOTE — Progress Notes (Signed)
? ?Post-Op Visit Note ?  ?Patient: [name]           ?Date of Birth: 10/09/1955           ?MRN: [id number] ?Visit Date: 07/20/2021 ?PCP: Celene Squibb, MD ? ?Chief Complaint:  ?Chief Complaint  ?Patient presents with  ? Left Foot - Routine Post Op  ?  07/13/21 left 2nd and 3rd toe amputation  ? ? ?HPI:  ?HPI ?The patient is a 66 year old woman who presents status post left second and third toe amputation she has been weightbearing in a postop shoe and using Tylenol for her pain ? ?Ortho Exam ?On examination of the left foot her incision is approximated with sutures there is no gaping or drainage no surrounding erythema there is granulation along the length of the incision. ? ?Visit Diagnoses: No diagnosis found. ? ?Plan: Begin daily Dial soap cleansing.  Dry dressing changes.  Minimize weightbearing.  Follow-up in 2 weeks for suture removal ? ?Follow-Up Instructions: No follow-ups on file.  ? ?Imaging: ?No results found. ? ?Orders:  ?No orders of the defined types were placed in this encounter. ? ?No orders of the defined types were placed in this encounter. ? ? ? ?PMFS History: ?Patient Active Problem List  ? Diagnosis Date Noted  ? Osteomyelitis of third toe of left foot ([place])   ? Pressure injury of skin 04/09/2021  ? SVT (supraventricular tachycardia) ([place]) 04/09/2021  ? Aspiration pneumonia ([place]) 04/08/2021  ? DKA, type 2 ([place]) 04/08/2021  ? Sepsis ([place]) 04/08/2021  ? AKI (acute kidney injury) ([place]) 04/08/2021  ? History of vitrectomy 09/14/2020  ? Stable treated proliferative diabetic retinopathy of right eye determined by examination associated with type 2 diabetes mellitus ([place]) 09/14/2020  ? Stable treated proliferative diabetic retinopathy of left eye determined by examination associated with type 2 diabetes mellitus ([place]) 09/14/2020  ? Diabetic ulcer of left great toe ([place])   ? DM (diabetes mellitus), type 2 with peripheral vascular complications ([place]) 79/06/8331  ? HLD (hyperlipidemia) 06/03/2020  ?  HTN (hypertension) 06/03/2020  ? Osteomyelitis of great toe of left foot ([place]) 06/03/2020  ? Cellulitis of left foot 06/03/2020  ? Atherosclerosis of native artery of left lower extremity with ulceration ([place]) 03/30/2020  ? DJD (degenerative joint disease) 04/21/2014  ? ?Past Medical History:  ?Diagnosis Date  ? Arthritis   ? OSTEO  ? Diabetes mellitus without complication ([place])   ? type 2  ? Family history of adverse reaction to anesthesia   ? Mother - N/V  ? Goiter   ? Hyperlipidemia   ? Hypertension   ? PONV (postoperative nausea and vomiting)   ? Retinopathy   ?  ?Family History  ?Problem Relation Age of Onset  ? Healthy Mother   ? Healthy Father   ?  ?Past Surgical History:  ?Procedure Laterality Date  ? ABDOMINAL AORTOGRAM W/LOWER EXTREMITY N/A 03/31/2020  ? Procedure: ABDOMINAL AORTOGRAM W/LOWER EXTREMITY;  Surgeon: Cherre Robins, MD;  Location: [facility];  Service: Cardiovascular;  Laterality: N/A;  ? ABDOMINAL HYSTERECTOMY    ? AIR/FLUID EXCHANGE Left 11/30/2014  ? Procedure: AIR/FLUID EXCHANGE LEFT EYE;  Surgeon: Hurman Horn, MD;  Location: [facility];  Service: Ophthalmology;  Laterality: Left;  ? AMPUTATION Left 06/11/2020  ? Procedure: LEFT GREAT TOE AMPUTATION;  Surgeon: Newt Minion, MD;  Location: [facility];  Service: Orthopedics;  Laterality: Left;  ? AMPUTATION Left 07/13/2021  ? Procedure: LEFT SECOND AND THIRD TOE  AMPUTATION;  Surgeon: Newt Minion, MD;  Location: [facility];  Service: Orthopedics;  Laterality: Left;  ? BACK SURGERY    ? CESAREAN SECTION    ? x 2  ? EYE SURGERY    ? INJECTION OF SILICONE OIL Right 35/00/9381  ? Procedure: INJECTION OF SILICONE OIL;  Surgeon: Hurman Horn, MD;  Location: [facility];  Service: Ophthalmology;  Laterality: Right;  ? INJECTION OF SILICONE OIL Left 82/99/3716  ? Procedure: INJECTION OF SILICONE OIL;  Surgeon: Hurman Horn, MD;  Location: [facility];  Service: Ophthalmology;  Laterality: Left;  ? LASER PHOTO ABLATION Right 05/26/2014  ? Procedure: LASER  PHOTO ABLATION;  Surgeon: Hurman Horn, MD;  Location: [facility];  Service: Ophthalmology;  Laterality: Right;  ? PARS PLANA VITRECTOMY Right 05/26/2014  ? Procedure: PARS PLANA VITRECTOMY 25 GAUGE;  Surgeon: Hurman Horn, MD;  Location: [facility];  Service: Ophthalmology;  Laterality: Right;  ? PARS PLANA VITRECTOMY Left 11/30/2014  ? Procedure: PARS PLANA VITRECTOMY WITH 25 GAUGE with endolaser;  Surgeon: Hurman Horn, MD;  Location: [facility];  Service: Ophthalmology;  Laterality: Left;  ? PERIPHERAL VASCULAR INTERVENTION Left 03/31/2020  ? Procedure: PERIPHERAL VASCULAR INTERVENTION;  Surgeon: Cherre Robins, MD;  Location: [facility];  Service: Cardiovascular;  Laterality: Left;  superficial femoral  ? TONSILLECTOMY    ? TOTAL HIP ARTHROPLASTY Right 04/21/2014  ? dr Percell Miller  ? TOTAL HIP ARTHROPLASTY Right 04/21/2014  ? Procedure: RIGHT TOTAL HIP ARTHROPLASTY ANTERIOR APPROACH;  Surgeon: Renette Butters, MD;  Location: [facility];  Service: Orthopedics;  Laterality: Right;  ? TUBAL LIGATION    ? ?Social History  ? ?Occupational History  ? Not on file  ?Tobacco Use  ? Smoking status: Former  ? Smokeless tobacco: Never  ? Tobacco comments:  ?  smoked as a teenager  ?Vaping Use  ? Vaping Use: Never used  ?Substance and Sexual Activity  ? Alcohol use: Yes  ?  Comment: pt may have one drink a month  ? Drug use: No  ? Sexual activity: Not on file  ? ? ?

## 2021-08-01 ENCOUNTER — Encounter: Payer: Self-pay | Admitting: Orthopedic Surgery

## 2021-08-01 ENCOUNTER — Ambulatory Visit (INDEPENDENT_AMBULATORY_CARE_PROVIDER_SITE_OTHER): Payer: Medicare Other | Admitting: Orthopedic Surgery

## 2021-08-01 DIAGNOSIS — M869 Osteomyelitis, unspecified: Secondary | ICD-10-CM

## 2021-08-01 NOTE — Progress Notes (Signed)
? ?Office Visit Note ?  ?Patient: [name]           ?Date of Birth: 21-May-1955           ?MRN: [id number] ?Visit Date: 08/01/2021 ?             ?Requested by: Lindsay Squibb, MD ?[name] ?[name] ?[name],  [name] [id number] ?PCP: Lindsay Squibb, MD ? ?Chief Complaint  ?Patient presents with  ? Left Foot - Routine Post Op  ?  07/13/2021 left foot 2nd and 3rd toe amputation   ? ? ? ? ?HPI: ?Patient is a 66 year old woman who presents status post left second and third toe amputation status post great toe amputation.  She is 3 weeks out. ? ?Assessment & Plan: ?Visit Diagnoses:  ?1. Osteomyelitis of third toe of left foot ([name])   ? ? ?Plan: Sutures are harvested patient will advance to regular shoewear she will work on Achilles stretching. ? ?Follow-Up Instructions: Return if symptoms worsen or fail to improve.  ? ?Ortho Exam ? ?Patient is alert, oriented, no adenopathy, well-dressed, normal affect, normal respiratory effort. ?Examination the incision is well-healed there is no redness cellulitis no swelling. ? ?Imaging: ?No results found. ?No images are attached to the encounter. ? ?Labs: ?Lab Results  ?Component Value Date  ? HGBA1C 7.4 (H) 04/08/2021  ? HGBA1C 7.2 (H) 06/03/2020  ? ? ? ?Lab Results  ?Component Value Date  ? ALBUMIN 3.6 06/03/2020  ? ALBUMIN 3.2 (L) 04/20/2019  ? ? ?Lab Results  ?Component Value Date  ? MG 2.1 04/11/2021  ? MG 2.0 04/10/2021  ? MG 2.0 04/09/2021  ? ?No results found for: VD25OH ? ?No results found for: PREALBUMIN ? ?  Latest Ref Rng & Units 07/13/2021  ?  8:39 AM 04/11/2021  ?  4:06 AM 04/10/2021  ?  5:33 AM  ?CBC EXTENDED  ?WBC 4.0 - 10.5 K/uL 7.5   12.3   17.8    ?RBC 3.87 - 5.11 MIL/uL 4.82   4.22   4.11    ?Hemoglobin 12.0 - 15.0 g/dL 14.5   12.8   12.5    ?HCT 36.0 - 46.0 % 44.4   37.7   37.4    ?Platelets 150 - 400 K/uL 253   372   356    ?NEUT# 1.7 - 7.7 K/uL  8.9   15.1    ?Lymph# 0.7 - 4.0 K/uL  2.0   1.5    ? ? ? ?There is no height or weight on file to calculate BMI. ? ?Orders:   ?No orders of the defined types were placed in this encounter. ? ?No orders of the defined types were placed in this encounter. ? ? ? Procedures: ?No procedures performed ? ?Clinical Data: ?No additional findings. ? ?ROS: ? ?All other systems negative, except as noted in the HPI. ?Review of Systems ? ?Objective: ?Vital Signs: There were no vitals taken for this visit. ? ?Specialty Comments:  ?No specialty comments available. ? ?PMFS History: ?Patient Active Problem List  ? Diagnosis Date Noted  ? Osteomyelitis of third toe of left foot ([place])   ? Pressure injury of skin 04/09/2021  ? SVT (supraventricular tachycardia) ([place]) 04/09/2021  ? Aspiration pneumonia ([place]) 04/08/2021  ? DKA, type 2 ([place]) 04/08/2021  ? Sepsis ([place]) 04/08/2021  ? AKI (acute kidney injury) ([place]) 04/08/2021  ? History of vitrectomy 09/14/2020  ? Stable treated proliferative diabetic retinopathy of right eye determined by examination  associated with type 2 diabetes mellitus ([place]) 09/14/2020  ? Stable treated proliferative diabetic retinopathy of left eye determined by examination associated with type 2 diabetes mellitus ([place]) 09/14/2020  ? Diabetic ulcer of left great toe ([place])   ? DM (diabetes mellitus), type 2 with peripheral vascular complications ([place]) 82/95/6213  ? HLD (hyperlipidemia) 06/03/2020  ? HTN (hypertension) 06/03/2020  ? Osteomyelitis of great toe of left foot ([place]) 06/03/2020  ? Cellulitis of left foot 06/03/2020  ? Atherosclerosis of native artery of left lower extremity with ulceration ([place]) 03/30/2020  ? DJD (degenerative joint disease) 04/21/2014  ? ?Past Medical History:  ?Diagnosis Date  ? Arthritis   ? OSTEO  ? Diabetes mellitus without complication ([place])   ? type 2  ? Family history of adverse reaction to anesthesia   ? Mother - N/V  ? Goiter   ? Hyperlipidemia   ? Hypertension   ? PONV (postoperative nausea and vomiting)   ? Retinopathy   ?  ?Family History  ?Problem Relation Age of Onset  ? Healthy Mother   ? Healthy  Father   ?  ?Past Surgical History:  ?Procedure Laterality Date  ? ABDOMINAL AORTOGRAM W/LOWER EXTREMITY N/A 03/31/2020  ? Procedure: ABDOMINAL AORTOGRAM W/LOWER EXTREMITY;  Surgeon: Cherre Robins, MD;  Location: [facility];  Service: Cardiovascular;  Laterality: N/A;  ? ABDOMINAL HYSTERECTOMY    ? AIR/FLUID EXCHANGE Left 11/30/2014  ? Procedure: AIR/FLUID EXCHANGE LEFT EYE;  Surgeon: Hurman Horn, MD;  Location: [facility];  Service: Ophthalmology;  Laterality: Left;  ? AMPUTATION Left 06/11/2020  ? Procedure: LEFT GREAT TOE AMPUTATION;  Surgeon: Newt Minion, MD;  Location: [facility];  Service: Orthopedics;  Laterality: Left;  ? AMPUTATION Left 07/13/2021  ? Procedure: LEFT SECOND AND THIRD TOE AMPUTATION;  Surgeon: Newt Minion, MD;  Location: [facility];  Service: Orthopedics;  Laterality: Left;  ? BACK SURGERY    ? CESAREAN SECTION    ? x 2  ? EYE SURGERY    ? INJECTION OF SILICONE OIL Right 08/65/7846  ? Procedure: INJECTION OF SILICONE OIL;  Surgeon: Hurman Horn, MD;  Location: [facility];  Service: Ophthalmology;  Laterality: Right;  ? INJECTION OF SILICONE OIL Left 96/29/5284  ? Procedure: INJECTION OF SILICONE OIL;  Surgeon: Hurman Horn, MD;  Location: [facility];  Service: Ophthalmology;  Laterality: Left;  ? LASER PHOTO ABLATION Right 05/26/2014  ? Procedure: LASER PHOTO ABLATION;  Surgeon: Hurman Horn, MD;  Location: [facility];  Service: Ophthalmology;  Laterality: Right;  ? PARS PLANA VITRECTOMY Right 05/26/2014  ? Procedure: PARS PLANA VITRECTOMY 25 GAUGE;  Surgeon: Hurman Horn, MD;  Location: [facility];  Service: Ophthalmology;  Laterality: Right;  ? PARS PLANA VITRECTOMY Left 11/30/2014  ? Procedure: PARS PLANA VITRECTOMY WITH 25 GAUGE with endolaser;  Surgeon: Hurman Horn, MD;  Location: [facility];  Service: Ophthalmology;  Laterality: Left;  ? PERIPHERAL VASCULAR INTERVENTION Left 03/31/2020  ? Procedure: PERIPHERAL VASCULAR INTERVENTION;  Surgeon: Cherre Robins, MD;  Location: [facility];   Service: Cardiovascular;  Laterality: Left;  superficial femoral  ? TONSILLECTOMY    ? TOTAL HIP ARTHROPLASTY Right 04/21/2014  ? dr Percell Miller  ? TOTAL HIP ARTHROPLASTY Right 04/21/2014  ? Procedure: RIGHT TOTAL HIP ARTHROPLASTY ANTERIOR APPROACH;  Surgeon: Renette Butters, MD;  Location: [facility];  Service: Orthopedics;  Laterality: Right;  ? TUBAL LIGATION    ? ?Social History  ? ?Occupational History  ?  Not on file  ?Tobacco Use  ? Smoking status: Former  ? Smokeless tobacco: Never  ? Tobacco comments:  ?  smoked as a teenager  ?Vaping Use  ? Vaping Use: Never used  ?Substance and Sexual Activity  ? Alcohol use: Yes  ?  Comment: pt may have one drink a month  ? Drug use: No  ? Sexual activity: Not on file  ? ? ? ? ? ?

## 2021-08-04 ENCOUNTER — Encounter (INDEPENDENT_AMBULATORY_CARE_PROVIDER_SITE_OTHER): Payer: Self-pay | Admitting: Ophthalmology

## 2021-08-04 ENCOUNTER — Ambulatory Visit (INDEPENDENT_AMBULATORY_CARE_PROVIDER_SITE_OTHER): Payer: Medicare Other | Admitting: Ophthalmology

## 2021-08-04 DIAGNOSIS — E113551 Type 2 diabetes mellitus with stable proliferative diabetic retinopathy, right eye: Secondary | ICD-10-CM | POA: Diagnosis not present

## 2021-08-04 DIAGNOSIS — H2011 Chronic iridocyclitis, right eye: Secondary | ICD-10-CM

## 2021-08-04 MED ORDER — FLUOROMETHOLONE 0.1 % OP SUSP: 1.0000 [drp] | Freq: Four times a day (QID) | OPHTHALMIC | 14 refills | Status: AC

## 2021-08-04 NOTE — Assessment & Plan Note (Signed)
Patient had treatment with Dr. Hermenia Bers in February 2023, tapering topical steroids in the right and left eye.  The right eye never cleared up. ? ?Today of minor KP with trace cell in the anterior chamber right eye.  We will commence with therapy in the right eye with topical Lotemax to minimize risk of glaucomatous progression or glaucoma development ?

## 2021-08-04 NOTE — Progress Notes (Signed)
? ? ?08/04/2021 ? ?  ? ?CHIEF COMPLAINT ?Patient presents for  ?Chief Complaint  ?Patient presents with  ? Retina Follow Up  ? ? ? ? ?HISTORY OF PRESENT ILLNESS: ?[name] is a 66 y.o. female who presents to the clinic today for:  ? ?HPI   ? ? Retina Follow Up   ? ?      ? Diagnosis: Diabetic Retinopathy  ? Laterality: left eye  ? Severity: moderate  ? Course: stable  ? ?  ?  ? ? Comments   ?WIP- Cloudy vision OD. Pt was last seen 3 mos ago. ?Pt reports vision has been stable.  ?Pt stated, "Out of my right eye, it feels like I have to Im looking through dirty windows." ?Pt had Septic pneumonia first of Jan and was told eye were infected and had to use steroid eye drops prescribed Dr. Marin Comment. ? ? ? ? ? ? ?  ?  ?Last edited by Silvestre Moment on 08/04/2021  8:39 AM.  ?  ? ? ?Referring physician: ?Celene Squibb, MD ?[address] ?[name] ?[place],  [place] [id number] ? ?HISTORICAL INFORMATION:  ? ?Selected notes from the [facility] ?  ? ?Lab Results  ?Component Value Date  ? HGBA1C 7.4 (H) 04/08/2021  ?  ? ?CURRENT MEDICATIONS: ?No current outpatient medications on file. (Ophthalmic Drugs)  ? ?No current facility-administered medications for this visit. (Ophthalmic Drugs)  ? ?Current Outpatient Medications (Other)  ?Medication Sig  ? aspirin EC 81 MG tablet Take 81 mg by mouth daily. Swallow whole.  ? b complex vitamins capsule Take 1 capsule by mouth daily.  ? Echinacea-Goldenseal (ECHINACEA COMB/GOLDEN SEAL PO) Take 1,350 mg by mouth daily.  ? glipiZIDE (GLUCOTROL) 10 MG tablet Take 10 mg by mouth 2 (two) times daily.  ? Glucosamine-Chondroitin (OSTEO BI-FLEX REGULAR STRENGTH PO) Take 2 tablets by mouth daily.  ? metoprolol tartrate (LOPRESSOR) 25 MG tablet Take 1 tablet (25 mg total) by mouth 2 (two) times daily.  ? Multiple Vitamins-Minerals (OCUVITE ADULT 50+ PO) Take 1 tablet by mouth daily.  ? NEXLETOL 180 MG TABS Take 180 mg by mouth daily.  ? SYNJARDY XR 01-999 MG TB24 Take 1 tablet by mouth 2 (two) times daily.  ?  Turmeric 500 MG CAPS Take 1,500 mg by mouth daily.  ? ?No current facility-administered medications for this visit. (Other)  ? ? ? ? ?REVIEW OF SYSTEMS: ?ROS   ?Negative for: Constitutional, Gastrointestinal, Neurological, Skin, Genitourinary, Musculoskeletal, HENT, Endocrine, Cardiovascular, Eyes, Respiratory, Psychiatric, Allergic/Imm, Heme/Lymph ?Last edited by Silvestre Moment on 08/04/2021  8:39 AM.  ?  ? ? ? ?ALLERGIES ?Allergies  ?Allergen Reactions  ? Anesthetics, Amide Nausea And Vomiting  ? Codeine Nausea And Vomiting  ? ? ?PAST MEDICAL HISTORY ?Past Medical History:  ?Diagnosis Date  ? Arthritis   ? OSTEO  ? Diabetes mellitus without complication ([place])   ? type 2  ? Family history of adverse reaction to anesthesia   ? Mother - N/V  ? Goiter   ? Hyperlipidemia   ? Hypertension   ? PONV (postoperative nausea and vomiting)   ? Retinopathy   ? ?Past Surgical History:  ?Procedure Laterality Date  ? ABDOMINAL AORTOGRAM W/LOWER EXTREMITY N/A 03/31/2020  ? Procedure: ABDOMINAL AORTOGRAM W/LOWER EXTREMITY;  Surgeon: Cherre Robins, MD;  Location: [facility];  Service: Cardiovascular;  Laterality: N/A;  ? ABDOMINAL HYSTERECTOMY    ? AIR/FLUID EXCHANGE Left 11/30/2014  ? Procedure: AIR/FLUID EXCHANGE  LEFT EYE;  Surgeon: Hurman Horn, MD;  Location: [facility];  Service: Ophthalmology;  Laterality: Left;  ? AMPUTATION Left 06/11/2020  ? Procedure: LEFT GREAT TOE AMPUTATION;  Surgeon: Newt Minion, MD;  Location: [facility];  Service: Orthopedics;  Laterality: Left;  ? AMPUTATION Left 07/13/2021  ? Procedure: LEFT SECOND AND THIRD TOE AMPUTATION;  Surgeon: Newt Minion, MD;  Location: [facility];  Service: Orthopedics;  Laterality: Left;  ? BACK SURGERY    ? CESAREAN SECTION    ? x 2  ? EYE SURGERY    ? INJECTION OF SILICONE OIL Right 89/37/3428  ? Procedure: INJECTION OF SILICONE OIL;  Surgeon: Hurman Horn, MD;  Location: [facility];  Service: Ophthalmology;  Laterality: Right;  ? INJECTION OF SILICONE OIL Left 76/81/1572  ?  Procedure: INJECTION OF SILICONE OIL;  Surgeon: Hurman Horn, MD;  Location: [facility];  Service: Ophthalmology;  Laterality: Left;  ? LASER PHOTO ABLATION Right 05/26/2014  ? Procedure: LASER PHOTO ABLATION;  Surgeon: Hurman Horn, MD;  Location: [facility];  Service: Ophthalmology;  Laterality: Right;  ? PARS PLANA VITRECTOMY Right 05/26/2014  ? Procedure: PARS PLANA VITRECTOMY 25 GAUGE;  Surgeon: Hurman Horn, MD;  Location: [facility];  Service: Ophthalmology;  Laterality: Right;  ? PARS PLANA VITRECTOMY Left 11/30/2014  ? Procedure: PARS PLANA VITRECTOMY WITH 25 GAUGE with endolaser;  Surgeon: Hurman Horn, MD;  Location: [facility];  Service: Ophthalmology;  Laterality: Left;  ? PERIPHERAL VASCULAR INTERVENTION Left 03/31/2020  ? Procedure: PERIPHERAL VASCULAR INTERVENTION;  Surgeon: Cherre Robins, MD;  Location: [facility];  Service: Cardiovascular;  Laterality: Left;  superficial femoral  ? TONSILLECTOMY    ? TOTAL HIP ARTHROPLASTY Right 04/21/2014  ? dr Percell Miller  ? TOTAL HIP ARTHROPLASTY Right 04/21/2014  ? Procedure: RIGHT TOTAL HIP ARTHROPLASTY ANTERIOR APPROACH;  Surgeon: Renette Butters, MD;  Location: [facility];  Service: Orthopedics;  Laterality: Right;  ? TUBAL LIGATION    ? ? ?FAMILY HISTORY ?Family History  ?Problem Relation Age of Onset  ? Healthy Mother   ? Healthy Father   ? ? ?SOCIAL HISTORY ?Social History  ? ?Tobacco Use  ? Smoking status: Former  ? Smokeless tobacco: Never  ? Tobacco comments:  ?  smoked as a teenager  ?Vaping Use  ? Vaping Use: Never used  ?Substance Use Topics  ? Alcohol use: Yes  ?  Comment: pt may have one drink a month  ? Drug use: No  ? ?  ? ?  ? ?OPHTHALMIC EXAM: ? ?Base Eye Exam   ? ? Visual Acuity (ETDRS)   ? ?   Right Left  ? Dist [place] 20/200 20/150  ? Dist ph [place] 20/150 NI  ? ?  ?  ? ? Tonometry (Tonopen, 8:44 AM)   ? ?   Right Left  ? Pressure 12 17  ? ?  ?  ? ? Pupils   ? ?   Pupils APD  ? Right PERRL None  ? Left PERRL None  ? ?  ?  ? ? Visual Fields   ? ?   Left Right  ?   Full Full  ? ?  ?  ? ? Extraocular Movement   ? ?   Right Left  ?  Full Full  ? ?  ?  ? ? Neuro/Psych   ? ? Oriented x3: Yes  ? Mood/Affect: Normal  ? ?  ?  ? ?  Dilation   ? ? Right eye: 1.0% Mydriacyl, 2.5% Phenylephrine @ 8:44 AM  ? ?  ?  ? ?  ? ?Slit Lamp and Fundus Exam   ? ? External Exam   ? ?   Right Left  ? External Normal Normal  ? ?  ?  ? ? Slit Lamp Exam   ? ?   Right Left  ? Lids/Lashes Normal Normal  ? Conjunctiva/Sclera White and quiet 2+ Injection  ? Cornea minor kp, Clear  ? Anterior Chamber Deep and, Trace Cell Deep and quiet  ? Iris Round and reactive Pharmacologically dilated  ? Lens Centered posterior chamber intraocular lens Centered posterior chamber intraocular lens  ? Anterior Vitreous Normal, clear 1+ cell  ? ?  ?  ? ? Fundus Exam   ? ?   Right Left  ? Posterior Vitreous Clear, avitric, no cells clear avitric  ? Disc 1+ Optic disc atrophy Normal  ? C/D Ratio 0.65 0.65  ? Macula Good focal laser scars, attached, geographic atrophy present centrally Good focal laser scars, attached, geographic atrophy present centrally a result of prior massive DME  ? Vessels Quiet PDR, good PRP peripherally Quiet PDR, good PRP peripherally  ? Periphery Attached, good PRP 360 Attached, good PRP 360  ? ?  ?  ? ?  ? ? ?IMAGING AND PROCEDURES  ?Imaging and Procedures for 08/04/21 ? ?OCT, Retina - OU - Both Eyes   ? ?   ?Right Eye ?Quality was good. Scan locations included subfoveal. Central Foveal Thickness: 179. Progression has been stable. Findings include abnormal foveal contour, outer retinal atrophy, inner retinal atrophy, central retinal atrophy.  ? ?Left Eye ?Quality was good. Scan locations included subfoveal. Central Foveal Thickness: 246. Progression has been stable. Findings include abnormal foveal contour, central retinal atrophy, inner retinal atrophy, outer retinal atrophy.  ? ?Notes ?OD, clear media post vitrectomy, silicone oil removal.  OS, clear media, foveal atrophy from prior RPE degeneration  and macular nonperfusion ? ?  ? ?Color Fundus Photography Optos - OU - Both Eyes   ? ?   ?Right Eye ?Progression has been stable. Disc findings include increased cup to disc ratio, pallor. Macula : retinal pi

## 2021-08-04 NOTE — Patient Instructions (Signed)
Patient to commence topical therapy OD ? ?Lotemax may substitute FML Forte 1 drop right eye 4 times daily ?

## 2021-08-17 ENCOUNTER — Ambulatory Visit (INDEPENDENT_AMBULATORY_CARE_PROVIDER_SITE_OTHER): Payer: Medicare Other | Admitting: Ophthalmology

## 2021-08-17 ENCOUNTER — Encounter (INDEPENDENT_AMBULATORY_CARE_PROVIDER_SITE_OTHER): Payer: Self-pay | Admitting: Ophthalmology

## 2021-08-17 DIAGNOSIS — E113552 Type 2 diabetes mellitus with stable proliferative diabetic retinopathy, left eye: Secondary | ICD-10-CM

## 2021-08-17 DIAGNOSIS — H2011 Chronic iridocyclitis, right eye: Secondary | ICD-10-CM

## 2021-08-17 DIAGNOSIS — E113551 Type 2 diabetes mellitus with stable proliferative diabetic retinopathy, right eye: Secondary | ICD-10-CM

## 2021-08-17 DIAGNOSIS — H353124 Nonexudative age-related macular degeneration, left eye, advanced atrophic with subfoveal involvement: Secondary | ICD-10-CM | POA: Diagnosis not present

## 2021-08-17 NOTE — Progress Notes (Signed)
? ? ?08/17/2021 ? ?  ? ?CHIEF COMPLAINT ?Patient presents for  ?Chief Complaint  ?Patient presents with  ? Retina Follow Up  ? ? ? ? ?HISTORY OF PRESENT ILLNESS: ?[name] is a 66 y.o. female who presents to the clinic today for:  ? ?HPI   ? ? Retina Follow Up   ? ?      ? Diagnosis: Diabetic Retinopathy  ? Laterality: both eyes  ? Onset: 2 weeks ago  ? ?  ?  ? ? Comments   ?2 weeks dilate OU, OCT. ?Patient reports vision is not as spotty as before, it seems a little better, but is not as clear. Patient states vision doesn't seem as clear in the left eye as it was before the oil was taken out.  ?LBS: 134 this morning ?A1C: 6.8 ?Lotemax initially 1 drop 4 times daily ? ?  ?  ?Last edited by Laurin Coder on 08/17/2021  9:18 AM.  ?  ? ? ?Referring physician: ?Celene Squibb, MD ?[address] ?[name] ?[place],  [place] [address] ? ?HISTORICAL INFORMATION:  ? ?Selected notes from the [facility] ?  ? ?Lab Results  ?Component Value Date  ? HGBA1C 7.4 (H) 04/08/2021  ?  ? ?CURRENT MEDICATIONS: ?Current Outpatient Medications (Ophthalmic Drugs)  ?Medication Sig  ? fluorometholone (FML) 0.1 % ophthalmic suspension Place 1 drop into the right eye 4 (four) times daily.  ? ?No current facility-administered medications for this visit. (Ophthalmic Drugs)  ? ?Current Outpatient Medications (Other)  ?Medication Sig  ? aspirin EC 81 MG tablet Take 81 mg by mouth daily. Swallow whole.  ? b complex vitamins capsule Take 1 capsule by mouth daily.  ? Echinacea-Goldenseal (ECHINACEA COMB/GOLDEN SEAL PO) Take 1,350 mg by mouth daily.  ? glipiZIDE (GLUCOTROL) 10 MG tablet Take 10 mg by mouth 2 (two) times daily.  ? Glucosamine-Chondroitin (OSTEO BI-FLEX REGULAR STRENGTH PO) Take 2 tablets by mouth daily.  ? metoprolol tartrate (LOPRESSOR) 25 MG tablet Take 1 tablet (25 mg total) by mouth 2 (two) times daily.  ? Multiple Vitamins-Minerals (OCUVITE ADULT 50+ PO) Take 1 tablet by mouth daily.  ? NEXLETOL 180 MG TABS Take 180 mg by  mouth daily.  ? SYNJARDY XR 01-999 MG TB24 Take 1 tablet by mouth 2 (two) times daily.  ? Turmeric 500 MG CAPS Take 1,500 mg by mouth daily.  ? ?No current facility-administered medications for this visit. (Other)  ? ? ? ? ?REVIEW OF SYSTEMS: ?ROS   ?Negative for: Constitutional, Gastrointestinal, Neurological, Skin, Genitourinary, Musculoskeletal, HENT, Endocrine, Cardiovascular, Eyes, Respiratory, Psychiatric, Allergic/Imm, Heme/Lymph ?Last edited by Hurman Horn, MD on 08/17/2021 10:16 AM.  ?  ? ? ? ?ALLERGIES ?Allergies  ?Allergen Reactions  ? Anesthetics, Amide Nausea And Vomiting  ? Codeine Nausea And Vomiting  ? ? ?PAST MEDICAL HISTORY ?Past Medical History:  ?Diagnosis Date  ? Arthritis   ? OSTEO  ? Diabetes mellitus without complication ([place])   ? type 2  ? Family history of adverse reaction to anesthesia   ? Mother - N/V  ? Goiter   ? Hyperlipidemia   ? Hypertension   ? PONV (postoperative nausea and vomiting)   ? Retinopathy   ? ?Past Surgical History:  ?Procedure Laterality Date  ? ABDOMINAL AORTOGRAM W/LOWER EXTREMITY N/A 03/31/2020  ? Procedure: ABDOMINAL AORTOGRAM W/LOWER EXTREMITY;  Surgeon: Cherre Robins, MD;  Location: [facility];  Service: Cardiovascular;  Laterality: N/A;  ? ABDOMINAL HYSTERECTOMY    ?  AIR/FLUID EXCHANGE Left 11/30/2014  ? Procedure: AIR/FLUID EXCHANGE LEFT EYE;  Surgeon: Hurman Horn, MD;  Location: [facility];  Service: Ophthalmology;  Laterality: Left;  ? AMPUTATION Left 06/11/2020  ? Procedure: LEFT GREAT TOE AMPUTATION;  Surgeon: Newt Minion, MD;  Location: [facility];  Service: Orthopedics;  Laterality: Left;  ? AMPUTATION Left 07/13/2021  ? Procedure: LEFT SECOND AND THIRD TOE AMPUTATION;  Surgeon: Newt Minion, MD;  Location: [facility];  Service: Orthopedics;  Laterality: Left;  ? BACK SURGERY    ? CESAREAN SECTION    ? x 2  ? EYE SURGERY    ? INJECTION OF SILICONE OIL Right 95/62/1308  ? Procedure: INJECTION OF SILICONE OIL;  Surgeon: Hurman Horn, MD;  Location: [facility];  Service: Ophthalmology;  Laterality: Right;  ? INJECTION OF SILICONE OIL Left 65/78/4696  ? Procedure: INJECTION OF SILICONE OIL;  Surgeon: Hurman Horn, MD;  Location: [facility];  Service: Ophthalmology;  Laterality: Left;  ? LASER PHOTO ABLATION Right 05/26/2014  ? Procedure: LASER PHOTO ABLATION;  Surgeon: Hurman Horn, MD;  Location: [facility];  Service: Ophthalmology;  Laterality: Right;  ? PARS PLANA VITRECTOMY Right 05/26/2014  ? Procedure: PARS PLANA VITRECTOMY 25 GAUGE;  Surgeon: Hurman Horn, MD;  Location: [facility];  Service: Ophthalmology;  Laterality: Right;  ? PARS PLANA VITRECTOMY Left 11/30/2014  ? Procedure: PARS PLANA VITRECTOMY WITH 25 GAUGE with endolaser;  Surgeon: Hurman Horn, MD;  Location: [facility];  Service: Ophthalmology;  Laterality: Left;  ? PERIPHERAL VASCULAR INTERVENTION Left 03/31/2020  ? Procedure: PERIPHERAL VASCULAR INTERVENTION;  Surgeon: Cherre Robins, MD;  Location: [facility];  Service: Cardiovascular;  Laterality: Left;  superficial femoral  ? TONSILLECTOMY    ? TOTAL HIP ARTHROPLASTY Right 04/21/2014  ? dr Percell Miller  ? TOTAL HIP ARTHROPLASTY Right 04/21/2014  ? Procedure: RIGHT TOTAL HIP ARTHROPLASTY ANTERIOR APPROACH;  Surgeon: Renette Butters, MD;  Location: [facility];  Service: Orthopedics;  Laterality: Right;  ? TUBAL LIGATION    ? ? ?FAMILY HISTORY ?Family History  ?Problem Relation Age of Onset  ? Healthy Mother   ? Healthy Father   ? ? ?SOCIAL HISTORY ?Social History  ? ?Tobacco Use  ? Smoking status: Former  ? Smokeless tobacco: Never  ? Tobacco comments:  ?  smoked as a teenager  ?Vaping Use  ? Vaping Use: Never used  ?Substance Use Topics  ? Alcohol use: Yes  ?  Comment: pt may have one drink a month  ? Drug use: No  ? ?  ? ?  ? ?OPHTHALMIC EXAM: ? ?Base Eye Exam   ? ? Visual Acuity (ETDRS)   ? ?   Right Left  ? Dist [place] 20/160 20/160  ? Dist ph [place] 20/125 20/100 -2  ? ?  ?  ? ? Tonometry (Tonopen, 9:16 AM)   ? ?   Right Left  ? Pressure 17 10  ? ?  ?  ? ? Pupils    ? ?   Pupils Dark Light APD  ? Right PERRL 3 3 None  ? Left PERRL 3 3 None  ? ?  ?  ? ? Extraocular Movement   ? ?   Right Left  ?  Full Full  ? ?  ?  ? ? Neuro/Psych   ? ? Oriented x3: Yes  ? Mood/Affect: Normal  ? ?  ?  ? ? Dilation   ? ? Both  eyes: 1.0% Mydriacyl, 2.5% Phenylephrine @ 9:16 AM  ? ?  ?  ? ?  ? ?Slit Lamp and Fundus Exam   ? ? External Exam   ? ?   Right Left  ? External Normal Normal  ? ?  ?  ? ? Slit Lamp Exam   ? ?   Right Left  ? Lids/Lashes Normal Normal  ? Conjunctiva/Sclera White and quiet 2+ Injection  ? Cornea minor kp, Clear  ? Anterior Chamber Deep and, Trace Cell Deep and quiet  ? Iris Round and reactive Pharmacologically dilated  ? Lens Centered posterior chamber intraocular lens Centered posterior chamber intraocular lens  ? Anterior Vitreous Normal, clear 1+ cell  ? ?  ?  ? ? Fundus Exam   ? ?   Right Left  ? Posterior Vitreous Clear, avitric, no cells clear avitric  ? Disc 1+ Optic disc atrophy Normal  ? C/D Ratio 0.65 0.65  ? Macula Good focal laser scars, attached, geographic atrophy present centrally Good focal laser scars, attached, geographic atrophy present centrally a result of prior massive DME  ? Vessels Quiet PDR, good PRP peripherally Quiet PDR, good PRP peripherally  ? Periphery Attached, good PRP 360 Attached, good PRP 360  ? ?  ?  ? ?  ? ? ?IMAGING AND PROCEDURES  ?Imaging and Procedures for 08/17/21 ? ?OCT, Retina - OU - Both Eyes   ? ?   ?Right Eye ?Quality was good. Scan locations included subfoveal. Central Foveal Thickness: 178. Progression has been stable. Findings include abnormal foveal contour, outer retinal atrophy, inner retinal atrophy, central retinal atrophy.  ? ?Left Eye ?Quality was good. Scan locations included subfoveal. Central Foveal Thickness: 264. Progression has been stable. Findings include abnormal foveal contour, central retinal atrophy, inner retinal atrophy, outer retinal atrophy.  ? ?Notes ?OD, clear media post vitrectomy, silicone oil  removal.  OS, clear media, foveal atrophy from prior RPE degeneration and macular nonperfusion ? ?  ? ? ?  ?  ? ?  ?ASSESSMENT/PLAN: ? ?Stable treated proliferative diabetic retinopathy of right eye determine

## 2021-08-17 NOTE — Assessment & Plan Note (Signed)
OD with clear media ?

## 2021-08-17 NOTE — Assessment & Plan Note (Signed)
OD, iritis has improved.  We will taper topical medication in the right eye to 1 drop twice daily for 1 week and 1 drop once daily for 1 week and then discontinue at 2 weeks ?

## 2021-08-17 NOTE — Assessment & Plan Note (Signed)
OS looks great, clear media.  Vision limited by dry atrophic AMD in the foveal region. ?

## 2021-08-17 NOTE — Assessment & Plan Note (Signed)
OS we will continue to monitor and observe ?

## 2021-08-24 ENCOUNTER — Encounter (INDEPENDENT_AMBULATORY_CARE_PROVIDER_SITE_OTHER): Payer: Medicare Other | Admitting: Ophthalmology

## 2021-10-11 DIAGNOSIS — R29898 Other symptoms and signs involving the musculoskeletal system: Secondary | ICD-10-CM | POA: Diagnosis not present

## 2021-10-11 DIAGNOSIS — M1990 Unspecified osteoarthritis, unspecified site: Secondary | ICD-10-CM | POA: Diagnosis not present

## 2021-10-11 DIAGNOSIS — G629 Polyneuropathy, unspecified: Secondary | ICD-10-CM | POA: Diagnosis not present

## 2021-10-12 ENCOUNTER — Other Ambulatory Visit (HOSPITAL_COMMUNITY): Payer: Self-pay | Admitting: Nurse Practitioner

## 2021-10-12 DIAGNOSIS — R29898 Other symptoms and signs involving the musculoskeletal system: Secondary | ICD-10-CM

## 2021-10-19 DIAGNOSIS — M79641 Pain in right hand: Secondary | ICD-10-CM | POA: Diagnosis not present

## 2021-10-19 DIAGNOSIS — G5611 Other lesions of median nerve, right upper limb: Secondary | ICD-10-CM | POA: Diagnosis not present

## 2021-10-19 DIAGNOSIS — G5601 Carpal tunnel syndrome, right upper limb: Secondary | ICD-10-CM | POA: Diagnosis not present

## 2021-12-19 ENCOUNTER — Encounter (INDEPENDENT_AMBULATORY_CARE_PROVIDER_SITE_OTHER): Payer: Self-pay | Admitting: Ophthalmology

## 2021-12-19 ENCOUNTER — Ambulatory Visit (INDEPENDENT_AMBULATORY_CARE_PROVIDER_SITE_OTHER): Payer: Medicare Other | Admitting: Ophthalmology

## 2021-12-19 DIAGNOSIS — E113552 Type 2 diabetes mellitus with stable proliferative diabetic retinopathy, left eye: Secondary | ICD-10-CM

## 2021-12-19 DIAGNOSIS — E113551 Type 2 diabetes mellitus with stable proliferative diabetic retinopathy, right eye: Secondary | ICD-10-CM

## 2021-12-19 DIAGNOSIS — R441 Visual hallucinations: Secondary | ICD-10-CM | POA: Diagnosis not present

## 2021-12-19 DIAGNOSIS — H353124 Nonexudative age-related macular degeneration, left eye, advanced atrophic with subfoveal involvement: Secondary | ICD-10-CM | POA: Diagnosis not present

## 2021-12-19 NOTE — Progress Notes (Signed)
12/19/2021     CHIEF COMPLAINT Patient presents for  Chief Complaint  Patient presents with   Diabetic Retinopathy without Macular Edema      HISTORY OF PRESENT ILLNESS: [name] is a 66 y.o. female who presents to the clinic today for:   HPI   Diabetic retinopathy of right eye associated w/ type 2 4 mths dilate ou color fp oct Pt states her vision has been stable Pt denies any new floaters or FOL Last edited by Morene Rankins, CMA on 12/19/2021  9:14 AM.      Referring physician: Celene Squibb, MD [place],  [place] [address]  HISTORICAL INFORMATION:   Selected notes from the MEDICAL RECORD NUMBER    Lab Results  Component Value Date   HGBA1C 7.4 (H) 04/08/2021     CURRENT MEDICATIONS: Current Outpatient Medications (Ophthalmic Drugs)  Medication Sig   fluorometholone (FML) 0.1 % ophthalmic suspension Place 1 drop into the right eye 4 (four) times daily.   No current facility-administered medications for this visit. (Ophthalmic Drugs)   Current Outpatient Medications (Other)  Medication Sig   aspirin EC 81 MG tablet Take 81 mg by mouth daily. Swallow whole.   b complex vitamins capsule Take 1 capsule by mouth daily.   Echinacea-Goldenseal (ECHINACEA COMB/GOLDEN SEAL PO) Take 1,350 mg by mouth daily.   glipiZIDE (GLUCOTROL) 10 MG tablet Take 10 mg by mouth 2 (two) times daily.   Glucosamine-Chondroitin (OSTEO BI-FLEX REGULAR STRENGTH PO) Take 2 tablets by mouth daily.   metoprolol tartrate (LOPRESSOR) 25 MG tablet Take 1 tablet (25 mg total) by mouth 2 (two) times daily.   Multiple Vitamins-Minerals (OCUVITE ADULT 50+ PO) Take 1 tablet by mouth daily.   NEXLETOL 180 MG TABS Take 180 mg by mouth daily.   SYNJARDY XR 01-999 MG TB24 Take 1 tablet by mouth 2 (two) times daily.   Turmeric 500 MG CAPS Take 1,500 mg by mouth daily.   No current facility-administered medications for this visit. (Other)      REVIEW OF SYSTEMS: ROS    Negative for: Constitutional, Gastrointestinal, Neurological, Skin, Genitourinary, Musculoskeletal, HENT, Endocrine, Cardiovascular, Eyes, Respiratory, Psychiatric, Allergic/Imm, Heme/Lymph Last edited by Morene Rankins, CMA on 12/19/2021  9:14 AM.       ALLERGIES Allergies  Allergen Reactions   Anesthetics, Amide Nausea And Vomiting   Codeine Nausea And Vomiting    PAST MEDICAL HISTORY Past Medical History:  Diagnosis Date   Arthritis    OSTEO   Diabetes mellitus without complication ([facility])    type 2   Family history of adverse reaction to anesthesia    Mother - N/V   Goiter    Hyperlipidemia    Hypertension    PONV (postoperative nausea and vomiting)    Retinopathy    Past Surgical History:  Procedure Laterality Date   ABDOMINAL AORTOGRAM W/LOWER EXTREMITY N/A 03/31/2020   Procedure: ABDOMINAL AORTOGRAM W/LOWER EXTREMITY;  Surgeon: Cherre Robins, MD;  Location: [facility];  Service: Cardiovascular;  Laterality: N/A;   ABDOMINAL HYSTERECTOMY     AIR/FLUID EXCHANGE Left 11/30/2014   Procedure: AIR/FLUID EXCHANGE LEFT EYE;  Surgeon: Hurman Horn, MD;  Location: [facility];  Service: Ophthalmology;  Laterality: Left;   AMPUTATION Left 06/11/2020   Procedure: LEFT GREAT TOE AMPUTATION;  Surgeon: Newt Minion, MD;  Location: [facility];  Service: Orthopedics;  Laterality: Left;   AMPUTATION Left 07/13/2021   Procedure: LEFT SECOND  AND THIRD TOE AMPUTATION;  Surgeon: Newt Minion, MD;  Location: [facility];  Service: Orthopedics;  Laterality: Left;   BACK SURGERY     CESAREAN SECTION     x 2   EYE SURGERY     INJECTION OF SILICONE OIL Right 65/78/4696   Procedure: INJECTION OF SILICONE OIL;  Surgeon: Hurman Horn, MD;  Location: [facility];  Service: Ophthalmology;  Laterality: Right;   INJECTION OF SILICONE OIL Left 29/52/8413   Procedure: INJECTION OF SILICONE OIL;  Surgeon: Hurman Horn, MD;  Location: [facility];  Service: Ophthalmology;  Laterality: Left;   LASER PHOTO  ABLATION Right 05/26/2014   Procedure: LASER PHOTO ABLATION;  Surgeon: Hurman Horn, MD;  Location: [facility];  Service: Ophthalmology;  Laterality: Right;   PARS PLANA VITRECTOMY Right 05/26/2014   Procedure: PARS PLANA VITRECTOMY 25 GAUGE;  Surgeon: Hurman Horn, MD;  Location: [facility];  Service: Ophthalmology;  Laterality: Right;   PARS PLANA VITRECTOMY Left 11/30/2014   Procedure: PARS PLANA VITRECTOMY WITH 25 GAUGE with endolaser;  Surgeon: Hurman Horn, MD;  Location: [facility];  Service: Ophthalmology;  Laterality: Left;   PERIPHERAL VASCULAR INTERVENTION Left 03/31/2020   Procedure: PERIPHERAL VASCULAR INTERVENTION;  Surgeon: Cherre Robins, MD;  Location: [facility];  Service: Cardiovascular;  Laterality: Left;  superficial femoral   TONSILLECTOMY     TOTAL HIP ARTHROPLASTY Right 04/21/2014   dr Percell Miller   TOTAL HIP ARTHROPLASTY Right 04/21/2014   Procedure: RIGHT TOTAL HIP ARTHROPLASTY ANTERIOR APPROACH;  Surgeon: Renette Butters, MD;  Location: [facility];  Service: Orthopedics;  Laterality: Right;   TUBAL LIGATION      FAMILY HISTORY Family History  Problem Relation Age of Onset   Healthy Mother    Healthy Father     SOCIAL HISTORY Social History   Tobacco Use   Smoking status: Former   Smokeless tobacco: Never   Tobacco comments:    smoked as a teenager  Scientific laboratory 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 used  Substance Use Topics   Alcohol use: Yes    Comment: pt may have one drink a month   Drug use: No         OPHTHALMIC EXAM:  Base Eye Exam     Visual Acuity (ETDRS)       Right Left   Dist ph [place] 20/250 20/125 -1         Tonometry (Tonopen, 9:19 AM)       Right Left   Pressure 24 21         Extraocular Movement       Right Left    Ortho Ortho    -- -- --  --  --  -- -- --   -- -- --  --  --  -- -- --           Neuro/Psych     Oriented x3: Yes   Mood/Affect: Normal         Dilation     Both eyes: 1.0% Mydriacyl, 2.5% Phenylephrine @ 9:16  AM           Slit Lamp and Fundus Exam     External Exam       Right Left   External Normal Normal         Slit Lamp Exam       Right Left   Lids/Lashes Normal Normal   Conjunctiva/Sclera White and quiet 2+ Injection  Cornea minor kp, Clear   Anterior Chamber Deep and, Trace Cell Deep and quiet   Iris Round and reactive Pharmacologically dilated   Lens Centered posterior chamber intraocular lens Centered posterior chamber intraocular lens   Anterior Vitreous Normal, clear 1+ cell         Fundus Exam       Right Left   Posterior Vitreous Clear, avitric, no cells clear avitric   Disc 1+ Optic disc atrophy Normal   C/D Ratio 0.65 0.65   Macula Good focal laser scars, attached, geographic atrophy present centrally Good focal laser scars, attached, geographic atrophy present centrally a result of prior massive DME   Vessels Quiet PDR, good PRP peripherally Quiet PDR, good PRP peripherally   Periphery Attached, good PRP 360 Attached, good PRP 360            IMAGING AND PROCEDURES  Imaging and Procedures for 12/19/21  OCT, Retina - OU - Both Eyes       Right Eye Quality was good. Scan locations included subfoveal. Central Foveal Thickness: 182. Progression has been stable. Findings include abnormal foveal contour, central retinal atrophy, inner retinal atrophy, outer retinal atrophy.   Left Eye Quality was good. Scan locations included subfoveal. Central Foveal Thickness: 249. Progression has been stable. Findings include abnormal foveal contour, central retinal atrophy, inner retinal atrophy, outer retinal atrophy.   Notes OD, clear media post vitrectomy, silicone oil removal.  OS, clear media, foveal atrophy from prior RPE degeneration and macular nonperfusion     Color Fundus Photography Optos - OU - Both Eyes       Right Eye Progression has been stable. Disc findings include increased cup to disc ratio, pallor. Macula : retinal pigment epithelium  abnormalities.   Left Eye Progression has been stable. Disc findings include increased cup to disc ratio. Macula : retinal pigment epithelium abnormalities.   Notes Bilateral quiet PDR, clear media, clear media OU, post vitrectomy post oil removal  OS, with quiescent PDR.  Bilateral foveal atrophy.  PDR quiet OU               ASSESSMENT/PLAN:  Spells of formed visual hallucinations Related to poor acuity.  Sherran Needs syndrome  Advanced nonexudative age-related macular degeneration of left eye with subfoveal involvement OU, progressive over time accounts for acuity  Stable treated proliferative diabetic retinopathy of right eye determined by examination associated with type 2 diabetes mellitus ([facility]) No sign of PDR activity  Stable treated proliferative diabetic retinopathy of left eye determined by examination associated with type 2 diabetes mellitus ([place]) Quiescent disease     ICD-10-CM   1. Stable treated proliferative diabetic retinopathy of right eye determined by examination associated with type 2 diabetes mellitus ([place])  E11.3551 OCT, Retina - OU - Both Eyes    Color Fundus Photography Optos - OU - Both Eyes    2. Spells of formed visual hallucinations  R44.1     3. Advanced nonexudative age-related macular degeneration of left eye with subfoveal involvement  H35.3124     4. Stable treated proliferative diabetic retinopathy of left eye determined by examination associated with type 2 diabetes mellitus ([place])  M01.0272       1.  OU stable overall.  Progressive atrophy from combination of advanced vascular disease from PDR now quiescent as well as dry AMD.  2.  3.  No signs of active disease OU observe  Ophthalmic Meds Ordered this visit:  No orders of the defined types were  placed in this encounter.      Return in about 6 months (around 06/19/2022) for DILATE OU, COLOR FP, OCT.  Patient Instructions  Patient to call to confirm contracting with [place]  [facility] upon the next return.   Explained the diagnoses, plan, and follow up with the patient and they expressed understanding.  Patient expressed understanding of the importance of proper follow up care.   Clent Demark Bronwyn Belasco M.D. Diseases & Surgery of the Retina and Vitreous Retina & Diabetic [facility] 12/19/21     Abbreviations: M myopia (nearsighted); A astigmatism; H hyperopia (farsighted); P presbyopia; Mrx spectacle prescription;  CTL contact lenses; OD right eye; OS left eye; OU both eyes  XT exotropia; ET esotropia; PEK punctate epithelial keratitis; PEE punctate epithelial erosions; DES dry eye syndrome; MGD meibomian gland dysfunction; ATs artificial tears; PFAT's preservative free artificial tears; [place] nuclear sclerotic cataract; PSC posterior subcapsular cataract; ERM epi-retinal membrane; PVD posterior vitreous detachment; RD retinal detachment; DM diabetes mellitus; DR diabetic retinopathy; NPDR non-proliferative diabetic retinopathy; PDR proliferative diabetic retinopathy; CSME clinically significant macular edema; DME diabetic macular edema; dbh dot blot hemorrhages; CWS cotton wool spot; POAG primary open angle glaucoma; C/D cup-to-disc ratio; HVF humphrey visual field; GVF goldmann visual field; OCT optical coherence tomography; IOP intraocular pressure; BRVO Branch retinal vein occlusion; CRVO central retinal vein occlusion; CRAO central retinal artery occlusion; BRAO branch retinal artery occlusion; RT retinal tear; SB scleral buckle; PPV pars plana vitrectomy; VH Vitreous hemorrhage; PRP panretinal laser photocoagulation; IVK intravitreal kenalog; VMT vitreomacular traction; MH Macular hole;  NVD neovascularization of the disc; NVE neovascularization elsewhere; AREDS age related eye disease study; ARMD age related macular degeneration; POAG primary open angle glaucoma; EBMD epithelial/anterior basement membrane dystrophy; ACIOL anterior chamber intraocular lens; IOL intraocular  lens; PCIOL posterior chamber intraocular lens; Phaco/IOL phacoemulsification with intraocular lens placement; [place] photorefractive keratectomy; LASIK laser assisted in situ keratomileusis; HTN hypertension; DM diabetes mellitus; COPD chronic obstructive pulmonary disease

## 2021-12-19 NOTE — Assessment & Plan Note (Signed)
Quiescent disease

## 2021-12-19 NOTE — Patient Instructions (Signed)
Patient to call to confirm contracting with [facility] [facility] upon the next return.

## 2021-12-19 NOTE — Assessment & Plan Note (Signed)
Related to poor acuity.  Sherran Needs syndrome

## 2021-12-19 NOTE — Assessment & Plan Note (Signed)
OU, progressive over time accounts for acuity

## 2021-12-19 NOTE — Assessment & Plan Note (Signed)
No sign of PDR activity

## 2022-01-03 DIAGNOSIS — Z0000 Encounter for general adult medical examination without abnormal findings: Secondary | ICD-10-CM | POA: Diagnosis not present

## 2022-01-03 DIAGNOSIS — Z136 Encounter for screening for cardiovascular disorders: Secondary | ICD-10-CM | POA: Diagnosis not present

## 2022-01-05 DIAGNOSIS — Z0000 Encounter for general adult medical examination without abnormal findings: Secondary | ICD-10-CM | POA: Diagnosis not present

## 2022-01-05 DIAGNOSIS — E875 Hyperkalemia: Secondary | ICD-10-CM | POA: Diagnosis not present

## 2022-01-05 DIAGNOSIS — E048 Other specified nontoxic goiter: Secondary | ICD-10-CM | POA: Diagnosis not present

## 2022-01-05 DIAGNOSIS — I10 Essential (primary) hypertension: Secondary | ICD-10-CM | POA: Diagnosis not present

## 2022-01-05 DIAGNOSIS — I471 Supraventricular tachycardia, unspecified: Secondary | ICD-10-CM | POA: Diagnosis not present

## 2022-01-05 DIAGNOSIS — Z89429 Acquired absence of other toe(s), unspecified side: Secondary | ICD-10-CM | POA: Diagnosis not present

## 2022-01-31 ENCOUNTER — Encounter (INDEPENDENT_AMBULATORY_CARE_PROVIDER_SITE_OTHER): Payer: Medicare Other | Admitting: Ophthalmology

## 2022-04-05 ENCOUNTER — Other Ambulatory Visit: Payer: Self-pay

## 2022-04-05 ENCOUNTER — Ambulatory Visit: Payer: Medicare Other | Admitting: Vascular Surgery

## 2022-04-05 ENCOUNTER — Encounter: Payer: Self-pay | Admitting: Vascular Surgery

## 2022-04-05 ENCOUNTER — Ambulatory Visit (INDEPENDENT_AMBULATORY_CARE_PROVIDER_SITE_OTHER): Payer: Medicare Other

## 2022-04-05 VITALS — BP 149/73 | HR 84 | Temp 98.1°F | Ht 66.0 in | Wt 180.2 lb

## 2022-04-05 DIAGNOSIS — I7025 Atherosclerosis of native arteries of other extremities with ulceration: Secondary | ICD-10-CM

## 2022-04-05 NOTE — Progress Notes (Signed)
[facility] [place]  Patient name: [name] MRN: [id number] DOB: 1955-09-09 Sex: female  REASON FOR VISIT: Follow-up prior left leg intervention  HPI: [name] is a 67 y.o. female here today for follow-up.  She had presented in December 2001 with gangrenous changes in her left great toe.  She underwent arteriography with Dr. Stanford Breed.  This revealed occlusive disease in the left superficial femoral and popliteal arteries.  She underwent left superficial artery angioplasty and stenting and drug-coated balloon intervention of her left popliteal artery.  She subsequently required amputation of her great second and third toe but has had complete healing of this.  She is here today for follow-up.  She has no claudication symptoms.  She walks without any prosthetic device.  Past Medical History:  Diagnosis Date   Arthritis    OSTEO   Diabetes mellitus without complication ([place])    type 2   Family history of adverse reaction to anesthesia    Mother - N/V   Goiter    Hyperlipidemia    Hypertension    PONV (postoperative nausea and vomiting)    Retinopathy     Family History  Problem Relation Age of Onset   Healthy Mother    Healthy Father     SOCIAL HISTORY: Social History   Tobacco Use   Smoking status: Former   Smokeless tobacco: Never   Tobacco comments:    smoked as a teenager  Substance Use Topics   Alcohol use: Yes    Comment: pt may have one drink a month    Allergies  Allergen Reactions   Anesthetics, Amide Nausea And Vomiting   Codeine Nausea And Vomiting    Current Outpatient Medications  Medication Sig Dispense Refill   aspirin EC 81 MG tablet Take 81 mg by mouth daily. Swallow whole.     b complex vitamins capsule Take 1 capsule by mouth daily.     Echinacea-Goldenseal (ECHINACEA COMB/GOLDEN SEAL PO) Take 1,350 mg by mouth daily.     glipiZIDE (GLUCOTROL) 10 MG tablet Take 10 mg by  mouth 2 (two) times daily.     Glucosamine-Chondroitin (OSTEO BI-FLEX REGULAR STRENGTH PO) Take 2 tablets by mouth daily.     metoprolol tartrate (LOPRESSOR) 25 MG tablet Take 1 tablet (25 mg total) by mouth 2 (two) times daily. 60 tablet 0   Multiple Vitamins-Minerals (OCUVITE ADULT 50+ PO) Take 1 tablet by mouth daily.     NEXLETOL 180 MG TABS Take 180 mg by mouth daily.     SYNJARDY XR 01-999 MG TB24 Take 1 tablet by mouth 2 (two) times daily.     Turmeric 500 MG CAPS Take 1,500 mg by mouth daily.     zinc gluconate 50 MG tablet Take 50 mg by mouth daily.     fluorometholone (FML) 0.1 % ophthalmic suspension Place 1 drop into the right eye 4 (four) times daily. (Patient not taking: Reported on 04/05/2022) 5 mL 14   No current facility-administered medications for this visit.    REVIEW OF SYSTEMS:  '[X]'$  denotes positive finding, '[ ]'$  denotes negative finding Cardiac  Comments:  Chest pain or chest pressure:    Shortness of breath upon exertion:    Short of breath when lying flat:    Irregular heart rhythm:        Vascular    Pain in calf, thigh, or hip brought on by ambulation:    Pain in feet at night that  wakes you up from your sleep:     Blood clot in your veins:    Leg swelling:           PHYSICAL EXAM: Vitals:   04/05/22 1422  BP: (!) 149/73  Pulse: 84  Temp: 98.1 F (36.7 C)  SpO2: 97%  Weight: 180 lb 3.2 oz (81.7 kg)  Height: '5\' 6"'$  (1.676 m)    GENERAL: The patient is a well-nourished female, in no acute distress. The vital signs are documented above. CARDIOVASCULAR: I do not palpate a left popliteal pulse.  She does have good Doppler flow at her peroneal, posterior tibial and dorsalis pedis in the left foot and right foot. PULMONARY: There is good air exchange  MUSCULOSKELETAL: There are no major deformities or cyanosis. NEUROLOGIC: No focal weakness or paresthesias are detected. SKIN: There are no ulcers or rashes noted. PSYCHIATRIC: The patient has a normal  affect.  DATA:  Noninvasive studies today were reviewed with the patient.  This reveals ankle arm index of 0.69 on the right and 0.55 on the left which is essentially unchanged from her prior study  MEDICAL ISSUES: No claudication and no tissue loss.  I do not palpate a popliteal pulse so in all likelihood she has had some restenosis of her superficial femoral artery on the left.  I discussed this with the patient.  Since she remains asymptomatic would not recommend any intervention.  She is very good about keeping a close eye on her feet.  She will notify us should she develop any tissue loss.  Otherwise we will see her again in 1 year with repeat noninvasive studies    Rosetta Posner, MD FACS [facility] [phone]  Note: Portions of this report may have been transcribed using voice recognition software.  Every effort has been made to ensure accuracy; however, inadvertent computerized transcription errors may still be present.

## 2022-05-26 DIAGNOSIS — E11319 Type 2 diabetes mellitus with unspecified diabetic retinopathy without macular edema: Secondary | ICD-10-CM | POA: Diagnosis not present

## 2022-05-26 DIAGNOSIS — M791 Myalgia, unspecified site: Secondary | ICD-10-CM | POA: Diagnosis not present

## 2022-05-26 DIAGNOSIS — Z7984 Long term (current) use of oral hypoglycemic drugs: Secondary | ICD-10-CM | POA: Diagnosis not present

## 2022-05-26 DIAGNOSIS — E118 Type 2 diabetes mellitus with unspecified complications: Secondary | ICD-10-CM | POA: Diagnosis not present

## 2022-05-26 DIAGNOSIS — Z87891 Personal history of nicotine dependence: Secondary | ICD-10-CM | POA: Diagnosis not present

## 2022-05-26 DIAGNOSIS — I10 Essential (primary) hypertension: Secondary | ICD-10-CM | POA: Diagnosis not present

## 2022-06-19 ENCOUNTER — Encounter (INDEPENDENT_AMBULATORY_CARE_PROVIDER_SITE_OTHER): Payer: Medicare Other | Admitting: Ophthalmology

## 2022-07-12 NOTE — Progress Notes (Signed)
[facility] - Clinic Note  07/13/2022     CHIEF COMPLAINT Patient presents for Retina Evaluation   HISTORY OF PRESENT ILLNESS: [name] is a 67 y.o. female who presents to the clinic today for:  HPI     Retina Evaluation   In both eyes.  I, the attending physician,  performed the HPI with the patient and updated documentation appropriately.        Comments   NP here for PDR OU, referred by Dr. Patient here for Retina Evaluation. Patient states vision is pretty lousy. Still works-limited. Eyes been that way for 12 years. Didn't know had diabetes until saw and eye doctor when couldn't see. No eye pain. OD has dry ARMD.       Last edited by Rennis Chris, MD on 07/13/2022  1:49 PM.    Pt is a previous Dr. Luciana Axe pt here due to insurance reasons, pt states she saw him for 12 years, the last time was in September, she states her right eye vision is "the pits", she states it's worse in the mornings, she feels like it has gotten worse since September, pt had septic pneumonia in January 2023, pt states around that same time, she saw Dr. Conley Rolls who told her she had an infection in her eyes, she was put on steroid eye drops, she states that is when her right eye vision started to fail, she says Dr. Luciana Axe told her she has dry ARMD also, pt had PPV with SO in 2016 in both eyes, she states she had it removed in 2022  Referring physician: Benita Stabile, MD [address] [place],  [place] [address]  HISTORICAL INFORMATION:  Selected notes from the MEDICAL RECORD NUMBER Previous Dr. Luciana Axe pt  LEE: 09.18.23 Ocular Hx- PDR OU h/o PPV w/ silicon oil OU (2016), PPV w/ SO removal OU in 2022 PMH-   CURRENT MEDICATIONS: Current Outpatient Medications (Ophthalmic Drugs)  Medication Sig   dorzolamide-timolol (COSOPT) 2-0.5 % ophthalmic solution Place 1 drop into both eyes 2 (two) times daily.   fluorometholone (FML) 0.1 % ophthalmic suspension Place 1 drop into the right  eye 4 (four) times daily. (Patient not taking: Reported on 04/05/2022)   No current facility-administered medications for this visit. (Ophthalmic Drugs)   Current Outpatient Medications (Other)  Medication Sig   aspirin EC 81 MG tablet Take 81 mg by mouth daily. Swallow whole.   b complex vitamins capsule Take 1 capsule by mouth daily.   Echinacea-Goldenseal (ECHINACEA COMB/GOLDEN SEAL PO) Take 1,350 mg by mouth daily.   glipiZIDE (GLUCOTROL) 10 MG tablet Take 10 mg by mouth 2 (two) times daily.   Glucosamine-Chondroitin (OSTEO BI-FLEX REGULAR STRENGTH PO) Take 2 tablets by mouth daily.   metoprolol tartrate (LOPRESSOR) 25 MG tablet Take 1 tablet (25 mg total) by mouth 2 (two) times daily.   Multiple Vitamins-Minerals (PRESERVISION AREDS 2) CAPS Take 1 capsule by mouth daily.   NEXLETOL 180 MG TABS Take 180 mg by mouth daily.   Semaglutide (RYBELSUS) 7 MG TABS Take 7 mg by mouth.   SYNJARDY XR 01-999 MG TB24 Take 1 tablet by mouth 2 (two) times daily.   Turmeric 500 MG CAPS Take 1,500 mg by mouth daily.   zinc gluconate 50 MG tablet Take 50 mg by mouth daily.   Multiple Vitamins-Minerals (OCUVITE ADULT 50+ PO) Take 1 tablet by mouth daily. (Patient not taking: Reported on 07/13/2022)   No current facility-administered medications for  this visit. (Other)   REVIEW OF SYSTEMS: ROS   Positive for: Endocrine, Eyes Last edited by Laddie Aquas, COA on 07/13/2022  9:30 AM.     ALLERGIES Allergies  Allergen Reactions   Anesthetics, Amide Nausea And Vomiting   Codeine Nausea And Vomiting   PAST MEDICAL HISTORY Past Medical History:  Diagnosis Date   Arthritis    OSTEO   Diabetes mellitus without complication    type 2   Family history of adverse reaction to anesthesia    Mother - N/V   Goiter    Hyperlipidemia    Hypertension    PONV (postoperative nausea and vomiting)    Retinopathy    Past Surgical History:  Procedure Laterality Date   ABDOMINAL AORTOGRAM W/LOWER  EXTREMITY N/A 03/31/2020   Procedure: ABDOMINAL AORTOGRAM W/LOWER EXTREMITY;  Surgeon: Leonie Douglas, MD;  Location: [facility];  Service: Cardiovascular;  Laterality: N/A;   ABDOMINAL HYSTERECTOMY     AIR/FLUID EXCHANGE Left 11/30/2014   Procedure: AIR/FLUID EXCHANGE LEFT EYE;  Surgeon: Edmon Crape, MD;  Location: [facility] OR;  Service: Ophthalmology;  Laterality: Left;   AMPUTATION Left 06/11/2020   Procedure: LEFT GREAT TOE AMPUTATION;  Surgeon: Nadara Mustard, MD;  Location: [facility] OR;  Service: Orthopedics;  Laterality: Left;   AMPUTATION Left 07/13/2021   Procedure: LEFT SECOND AND THIRD TOE AMPUTATION;  Surgeon: Nadara Mustard, MD;  Location: [facility] OR;  Service: Orthopedics;  Laterality: Left;   BACK SURGERY     CESAREAN SECTION     x 2   EYE SURGERY     INJECTION OF SILICONE OIL Right 05/26/2014   Procedure: INJECTION OF SILICONE OIL;  Surgeon: Edmon Crape, MD;  Location: [facility] OR;  Service: Ophthalmology;  Laterality: Right;   INJECTION OF SILICONE OIL Left 11/30/2014   Procedure: INJECTION OF SILICONE OIL;  Surgeon: Edmon Crape, MD;  Location: [facility] OR;  Service: Ophthalmology;  Laterality: Left;   LASER PHOTO ABLATION Right 05/26/2014   Procedure: LASER PHOTO ABLATION;  Surgeon: Edmon Crape, MD;  Location[facility] OR;  Service: Ophthalmology;  Laterality: Right;   PARS PLANA VITRECTOMY Right 05/26/2014   Procedure: PARS PLANA VITRECTOMY 25 GAUGE;  Surgeon: Edmon Crape, MD;  Location[facility] OR;  Service: Ophthalmology;  Laterality: Right;   PARS PLANA VITRECTOMY Left 11/30/2014   Procedure: PARS PLANA VITRECTOMY WITH 25 GAUGE with endolaser;  Surgeon: Edmon Crape, MD;  Location: [facility] OR;  Service: Ophthalmology;  Laterality: Left;   PERIPHERAL VASCULAR INTERVENTION Left 03/31/2020   Procedure: PERIPHERAL VASCULAR INTERVENTION;  Surgeon: Leonie Douglas, MD;  Location: [facility];  Service: Cardiovascular;  Laterality: Left;  superficial femoral   TONSILLECTOMY     TOTAL HIP  ARTHROPLASTY Right 04/21/2014   dr Eulah Pont   TOTAL HIP ARTHROPLASTY Right 04/21/2014   Procedure: RIGHT TOTAL HIP ARTHROPLASTY ANTERIOR APPROACH;  Surgeon: Sheral Apley, MD;  Location: [facility] OR;  Service: Orthopedics;  Laterality: Right;   TUBAL LIGATION     FAMILY HISTORY Family History  Problem Relation Age of Onset   Healthy Mother    Healthy Father    SOCIAL HISTORY Social History   Tobacco Use   Smoking status: Former   Smokeless tobacco: Never   Tobacco comments:    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   Vaping Use: Never used  Substance Use Topics   Alcohol use: Yes    Comment: pt may have one drink  a month   Drug use: No       OPHTHALMIC EXAM:  Base Eye Exam     Visual Acuity (Snellen - Linear)       Right Left   Dist [place] CF at 3' 20/150 -2   Dist ph [place] NI NI         Tonometry (Tonopen, 9:21 AM)       Right Left   Pressure 30,30 19         Tonometry #2 (Tonopen, 9:54 AM)       Right Left   Pressure 21 11         Tonometry Comments   Brimonidine and cosopt given @ 9:24 am        Pupils       Dark Light Shape React APD   Right 3 2 Round Minimal None   Left 3 2 Round Minimal None         Visual Fields (Counting fingers)       Left Right   Restrictions  Total superior temporal, superior nasal, inferior nasal deficiencies; Partial inner inferior temporal deficiency         Extraocular Movement       Right Left    Full, Ortho Full, Ortho         Neuro/Psych     Oriented x3: Yes   Mood/Affect: Normal         Dilation     Both eyes: 1.0% Mydriacyl, 2.5% Phenylephrine @ 9:21 AM           Slit Lamp and Fundus Exam     Slit Lamp Exam       Right Left   Lids/Lashes Dermatochalasis - upper lid, mild MGD Dermatochalasis - upper lid   Conjunctiva/Sclera nasal pingeucula Silicone oil cysts   Cornea tear film debris, punctate KP 1-2+ Punctate epithelial erosions fine KP   Anterior Chamber deep, 1-2+cell/pigment deep  and clear, No cell or pigment   Iris Round and moderately dilated Round and moderately dilated   Lens PC IOL in good position PC IOL in good position   Anterior Vitreous post vitrectomy post vitrectomy, residual anterior hyaloid visible         Fundus Exam       Right Left   Disc 3-4+pallor, +cupping 2-3+pallor, +cupping   C/D Ratio 0.8 0.65   Macula Flat, Blunted foveal reflex, central atrophy, mild pigment clumping, No heme or edema Flat, Blunted foveal reflex, diffuse CR atrophy / laser scars   Vessels mild attenuation, Tortuous mild attenuation, Tortuous   Periphery Attached, dense 360 PRP Attached, dense 360 PRP           Refraction     Manifest Refraction   Unable to improve with refraction           IMAGING AND PROCEDURES  Imaging and Procedures for 07/13/2022  OCT, Retina - OU - Both Eyes       Right Eye Quality was good. Central Foveal Thickness: 162. Progression has no prior data. Findings include normal foveal contour, no IRF, no SRF, epiretinal membrane, macular pucker, outer retinal atrophy (Diffuse atrophy with subfoveal thinning, mild ERM and pucker).   Left Eye Quality was good. Central Foveal Thickness: 297. Progression has no prior data. Findings include normal foveal contour, no IRF, no SRF, outer retinal atrophy (Diffuse ORA).   Notes *Images captured and stored on drive  Diagnosis / Impression:  OD: Diffuse atrophy with subfoveal thinning /  GA, mild ERM and pucker OS: Diffuse ORA  Clinical management:  See below  Abbreviations: NFP - Normal foveal profile. CME - cystoid macular edema. PED - pigment epithelial detachment. IRF - intraretinal fluid. SRF - subretinal fluid. EZ - ellipsoid zone. ERM - epiretinal membrane. ORA - outer retinal atrophy. ORT - outer retinal tubulation. SRHM - subretinal hyper-reflective material. IRHM - intraretinal hyper-reflective material           ASSESSMENT/PLAN:   ICD-10-CM   1. 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 Essential hypertension  I10     3. Hypertensive retinopathy of both eyes  H35.033     4. Pseudophakia, both eyes  Z96.1     5. Primary open angle glaucoma (POAG) of both eyes, severe stage  H40.1133      Proliferative diabetic retinopathy w/o DME, OU  - former [facility] pt -- transferred care due to insurance  - last A1c 7.4 on 01.06.23  - s/p PPV/EL/SO (OD: 02.23.16, OS:08.29.16  -- Dr. Luciana Axe)  - s/p PPV w/ SOR (2022 -- Dr. Luciana Axe) - The incidence, risk factors for progression, natural history and treatment options for diabetic retinopathy were discussed with patient.   - The need for close monitoring of blood glucose, blood pressure, and serum lipids, avoiding cigarette or any type of tobacco, and the need for long term follow up was also discussed with patient. - BCVA CF 3' OD and 20/150 OS - exam shows dense PRP and diffuse atrophy; severe vascular attenuation and disc pallor OU - OCT without diabetic macular edema, both eyes  - discussed findings and prognosis - f/u in 6 wks for DFE/OCT, IOP check  2,3. Hypertensive retinopathy OU - discussed importance of tight BP control - monitor  4. Pseudophakia OU  - s/p CE/IOL OU  - IOL in good position, doing well  - monitor  5. Severe stage POAG  - initial IOP: 30, 19 by tonopen checked by technician  - after one drop of brimonidine and Cosopt OU -- IOP was 21,11 checked by MD  - 3-4+ disc pallor and +cupping OU  - will start Cosopt BID OU  - f/u in 6 wks for IOP check  Ophthalmic Meds Ordered this visit:  Meds ordered this encounter  Medications   dorzolamide-timolol (COSOPT) 2-0.5 % ophthalmic solution    Sig: Place 1 drop into both eyes 2 (two) times daily.    Dispense:  10 mL    Refill:  3    Return in about 6 weeks (around 08/24/2022) for PDR OU, Dilated Exam, OCT, IOP check.  There are  no Patient Instructions on file for this visit.  Explained the diagnoses, plan, and follow up with the patient and they expressed understanding.  Patient expressed understanding of the importance of proper follow up care.   This document serves as a record of services personally performed by Karie Chimera, MD, PhD. It was created on their behalf by Glee Arvin. Manson Passey, OA an ophthalmic technician. The creation of this record is the provider's dictation and/or activities during the visit.    Electronically signed by: Glee Arvin. Manson Passey, [place] 04.10.2024 1:52 PM  Karie Chimera, M.D., Ph.D. Diseases & Surgery of the Retina and Vitreous [facility] 07/13/2022  I have reviewed the above documentation for accuracy and completeness, and I agree with the above.  Karie Chimera, M.D., Ph.D. 07/13/22 1:57 PM   Abbreviations: M myopia (nearsighted); A astigmatism; H hyperopia (farsighted); P presbyopia; Mrx spectacle prescription;  CTL contact lenses; OD right eye; OS left eye; OU both eyes  XT exotropia; ET esotropia; PEK punctate epithelial keratitis; PEE punctate epithelial erosions; DES dry eye syndrome; MGD meibomian gland dysfunction; ATs artificial tears; PFAT's preservative free artificial tears; NSC nuclear sclerotic cataract; PSC posterior subcapsular cataract; ERM epi-retinal membrane; PVD posterior vitreous detachment; RD retinal detachment; DM diabetes mellitus; DR diabetic retinopathy; NPDR non-proliferative diabetic retinopathy; PDR proliferative diabetic retinopathy; CSME clinically significant macular edema; DME diabetic macular edema; dbh dot blot hemorrhages; CWS cotton wool spot; POAG primary open angle glaucoma; C/D cup-to-disc ratio; HVF humphrey visual field; GVF goldmann visual field; OCT optical coherence tomography; IOP intraocular pressure; BRVO Branch retinal vein occlusion; CRVO central retinal vein occlusion; CRAO central retinal artery occlusion; BRAO branch  retinal artery occlusion; RT retinal tear; SB scleral buckle; PPV pars plana vitrectomy; VH Vitreous hemorrhage; PRP panretinal laser photocoagulation; IVK intravitreal kenalog; VMT vitreomacular traction; MH Macular hole;  NVD neovascularization of the disc; NVE neovascularization elsewhere; AREDS age related eye disease study; ARMD age related macular degeneration; POAG primary open angle glaucoma; EBMD epithelial/anterior basement membrane dystrophy; ACIOL anterior chamber intraocular lens; IOL intraocular lens; PCIOL posterior chamber intraocular lens; Phaco/IOL phacoemulsification with intraocular lens placement; PRK photorefractive keratectomy; LASIK laser assisted in situ keratomileusis; HTN hypertension; DM diabetes mellitus; COPD chronic obstructive pulmonary disease

## 2022-07-13 ENCOUNTER — Encounter (INDEPENDENT_AMBULATORY_CARE_PROVIDER_SITE_OTHER): Payer: Self-pay | Admitting: Ophthalmology

## 2022-07-13 ENCOUNTER — Ambulatory Visit (INDEPENDENT_AMBULATORY_CARE_PROVIDER_SITE_OTHER): Payer: Medicare Other | Admitting: Ophthalmology

## 2022-07-13 DIAGNOSIS — H35033 Hypertensive retinopathy, bilateral: Secondary | ICD-10-CM | POA: Diagnosis not present

## 2022-07-13 DIAGNOSIS — E113593 Type 2 diabetes mellitus with proliferative diabetic retinopathy without macular edema, bilateral: Secondary | ICD-10-CM | POA: Diagnosis not present

## 2022-07-13 DIAGNOSIS — Z961 Presence of intraocular lens: Secondary | ICD-10-CM

## 2022-07-13 DIAGNOSIS — I10 Essential (primary) hypertension: Secondary | ICD-10-CM | POA: Diagnosis not present

## 2022-07-13 DIAGNOSIS — H3581 Retinal edema: Secondary | ICD-10-CM

## 2022-07-13 DIAGNOSIS — H401133 Primary open-angle glaucoma, bilateral, severe stage: Secondary | ICD-10-CM | POA: Diagnosis not present

## 2022-07-13 MED ORDER — DORZOLAMIDE HCL-TIMOLOL MAL 2-0.5 % OP SOLN: 1.0000 [drp] | Freq: Two times a day (BID) | OPHTHALMIC | 3 refills | Status: DC

## 2022-08-10 NOTE — Progress Notes (Signed)
[facility] - Clinic Note  08/24/2022     CHIEF COMPLAINT Patient presents for Retina Follow Up   HISTORY OF PRESENT ILLNESS: [name] is a 67 y.o. female who presents to the clinic today for:  HPI     Retina Follow Up   Patient presents with  Diabetic Retinopathy.  In both eyes.  Duration of 6 weeks.  I, the attending physician,  performed the HPI with the patient and updated documentation appropriately.        Comments   Patient feels that the vision varies from time to time. She is using Cosopt OU BID. Her blood sugar was 135.      Last edited by Rennis Chris, MD on 08/24/2022  4:37 PM.      Referring physician: Benita Stabile, MD [address] [place],  [place] [address]  HISTORICAL INFORMATION:  Selected notes from the MEDICAL RECORD NUMBER Previous Dr. Luciana Axe pt  LEE: 09.18.23 Ocular Hx- PDR OU h/o PPV w/ silicon oil OU (2016), PPV w/ SO removal OU in 2022 PMH-   CURRENT MEDICATIONS: Current Outpatient Medications (Ophthalmic Drugs)  Medication Sig   brimonidine (ALPHAGAN) 0.15 % ophthalmic solution Place 1 drop into both eyes every 8 (eight) hours.   dorzolamide-timolol (COSOPT) 2-0.5 % ophthalmic solution Place 1 drop into both eyes 2 (two) times daily.   No current facility-administered medications for this visit. (Ophthalmic Drugs)   Current Outpatient Medications (Other)  Medication Sig   aspirin EC 81 MG tablet Take 81 mg by mouth daily. Swallow whole.   b complex vitamins capsule Take 1 capsule by mouth daily.   Echinacea-Goldenseal (ECHINACEA COMB/GOLDEN SEAL PO) Take 1,350 mg by mouth daily.   glipiZIDE (GLUCOTROL) 10 MG tablet Take 10 mg by mouth 2 (two) times daily.   Glucosamine-Chondroitin (OSTEO BI-FLEX REGULAR STRENGTH PO) Take 2 tablets by mouth daily.   metoprolol tartrate (LOPRESSOR) 25 MG tablet Take 1 tablet (25 mg total) by mouth 2 (two) times daily.   Multiple Vitamins-Minerals (OCUVITE ADULT 50+ PO) Take 1  tablet by mouth daily.   Multiple Vitamins-Minerals (PRESERVISION AREDS 2) CAPS Take 1 capsule by mouth daily.   NEXLETOL 180 MG TABS Take 180 mg by mouth daily.   Semaglutide (RYBELSUS) 7 MG TABS Take 7 mg by mouth.   SYNJARDY XR 01-999 MG TB24 Take 1 tablet by mouth 2 (two) times daily.   Turmeric 500 MG CAPS Take 1,500 mg by mouth daily.   zinc gluconate 50 MG tablet Take 50 mg by mouth daily.   No current facility-administered medications for this visit. (Other)   REVIEW OF SYSTEMS: ROS   Negative for: Constitutional, Gastrointestinal, Neurological, Skin, Genitourinary, Musculoskeletal, HENT, Endocrine, Cardiovascular, Eyes, Respiratory, Psychiatric, Allergic/Imm, Heme/Lymph Last edited by Julieanne Cotton, COT on 08/24/2022  8:35 AM.      ALLERGIES Allergies  Allergen Reactions   Anesthetics, Amide Nausea And Vomiting   Codeine Nausea And Vomiting   PAST MEDICAL HISTORY Past Medical History:  Diagnosis Date   Arthritis    OSTEO   Diabetes mellitus without complication (HCC)    type 2   Family history of adverse reaction to anesthesia    Mother - N/V   Goiter    Hyperlipidemia    Hypertension    PONV (postoperative nausea and vomiting)    Retinopathy    Past Surgical History:  Procedure Laterality Date   ABDOMINAL AORTOGRAM W/LOWER EXTREMITY N/A 03/31/2020   Procedure:  ABDOMINAL AORTOGRAM W/LOWER EXTREMITY;  Surgeon: Leonie Douglas, MD;  Location: [facility] INVASIVE CV LAB;  Service: Cardiovascular;  Laterality: N/A;   ABDOMINAL HYSTERECTOMY     AIR/FLUID EXCHANGE Left 11/30/2014   Procedure: AIR/FLUID EXCHANGE LEFT EYE;  Surgeon: Edmon Crape, MD;  Location: [facility] OR;  Service: Ophthalmology;  Laterality: Left;   AMPUTATION Left 06/11/2020   Procedure: LEFT GREAT TOE AMPUTATION;  Surgeon: Nadara Mustard, MD;  Location: [facility] OR;  Service: Orthopedics;  Laterality: Left;   AMPUTATION Left 07/13/2021   Procedure: LEFT SECOND AND THIRD TOE AMPUTATION;  Surgeon: Nadara Mustard, MD;  Location: [facility] OR;  Service: Orthopedics;  Laterality: Left;   BACK SURGERY     CESAREAN SECTION     x 2   EYE SURGERY     INJECTION OF SILICONE OIL Right 05/26/2014   Procedure: INJECTION OF SILICONE OIL;  Surgeon: Edmon Crape, MD;  Location: [facility] OR;  Service: Ophthalmology;  Laterality: Right;   INJECTION OF SILICONE OIL Left 11/30/2014   Procedure: INJECTION OF SILICONE OIL;  Surgeon: Edmon Crape, MD;  Location: [facility] OR;  Service: Ophthalmology;  Laterality: Left;   LASER PHOTO ABLATION Right 05/26/2014   Procedure: LASER PHOTO ABLATION;  Surgeon: Edmon Crape, MD;  Location: [facility] OR;  Service: Ophthalmology;  Laterality: Right;   PARS PLANA VITRECTOMY Right 05/26/2014   Procedure: PARS PLANA VITRECTOMY 25 GAUGE;  Surgeon: Edmon Crape, MD;  Location: [facility] OR;  Service: Ophthalmology;  Laterality: Right;   PARS PLANA VITRECTOMY Left 11/30/2014   Procedure: PARS PLANA VITRECTOMY WITH 25 GAUGE with endolaser;  Surgeon: Edmon Crape, MD;  Location: [facility] OR;  Service: Ophthalmology;  Laterality: Left;   PERIPHERAL VASCULAR INTERVENTION Left 03/31/2020   Procedure: PERIPHERAL VASCULAR INTERVENTION;  Surgeon: Leonie Douglas, MD;  Location: [facility] INVASIVE CV LAB;  Service: Cardiovascular;  Laterality: Left;  superficial femoral   TONSILLECTOMY     TOTAL HIP ARTHROPLASTY Right 04/21/2014   dr Eulah Pont   TOTAL HIP ARTHROPLASTY Right 04/21/2014   Procedure: RIGHT TOTAL HIP ARTHROPLASTY ANTERIOR APPROACH;  Surgeon: Sheral Apley, MD;  Location: [facility] OR;  Service: Orthopedics;  Laterality: Right;   TUBAL LIGATION     FAMILY HISTORY Family History  Problem Relation Age of Onset   Healthy Mother    Healthy Father    SOCIAL HISTORY Social History   Tobacco Use   Smoking status: Former   Smokeless tobacco: Never   Tobacco comments:    smoked as a teenager  Building services engineer Use: Never used  Substance Use Topics   Alcohol use: Yes    Comment: pt may have one drink a month    Drug use: No       OPHTHALMIC EXAM:  Base Eye Exam     Visual Acuity (Snellen - Linear)       Right Left   Dist [place] HM 20/150   Dist ph [place] NI 20/100 -2         Tonometry (Tonopen, 8:38 AM)       Right Left   Pressure 22 18         Pupils       Dark Light Shape React APD   Right 3 2 Round Minimal None   Left 3 2 Round Minimal None         Visual Fields       Left Right    Full    Restrictions  Total superior temporal, superior nasal, inferior nasal deficiencies; Partial inner inferior temporal deficiency         Extraocular Movement       Right Left    Full, Ortho Full, Ortho         Neuro/Psych     Oriented x3: Yes   Mood/Affect: Normal         Dilation     Both eyes: 1.0% Mydriacyl, 2.5% Phenylephrine @ 8:36 AM           Slit Lamp and Fundus Exam     Slit Lamp Exam       Right Left   Lids/Lashes Dermatochalasis - upper lid, mild MGD Dermatochalasis - upper lid   Conjunctiva/Sclera nasal pingeucula Silicone oil cysts   Cornea tear film debris, punctate KP 1-2+ Punctate epithelial erosions fine KP   Anterior Chamber deep, 1-2+cell/pigment deep and clear, No cell or pigment   Iris Round and moderately dilated Round and moderately dilated   Lens PC IOL in good position PC IOL in good position   Anterior Vitreous post vitrectomy post vitrectomy, residual anterior hyaloid visible         Fundus Exam       Right Left   Disc 3-4+pallor, +cupping 2-3+pallor, +cupping   C/D Ratio 0.8 0.65   Macula Flat, Blunted foveal reflex, central atrophy, mild pigment clumping, No heme or edema Flat, Blunted foveal reflex, diffuse CR atrophy / laser scars   Vessels mild attenuation, Tortuous mild attenuation, Tortuous   Periphery Attached, dense 360 PRP Attached, dense 360 PRP            IMAGING AND PROCEDURES  Imaging and Procedures for 08/24/2022  OCT, Retina - OU - Both Eyes       Right Eye Quality was good. Central Foveal  Thickness: 161. Progression has been stable. Findings include normal foveal contour, no IRF, no SRF, epiretinal membrane, macular pucker, outer retinal atrophy (Diffuse atrophy with subfoveal thinning, mild ERM and pucker).   Left Eye Quality was good. Central Foveal Thickness: 231. Progression has been stable. Findings include normal foveal contour, no IRF, no SRF, outer retinal atrophy (Diffuse ORA).   Notes *Images captured and stored on drive  Diagnosis / Impression:  OD: Diffuse atrophy with subfoveal thinning / GA, mild ERM and pucker OS: Diffuse ORA  Clinical management:  See below  Abbreviations: NFP - Normal foveal profile. CME - cystoid macular edema. PED - pigment epithelial detachment. IRF - intraretinal fluid. SRF - subretinal fluid. EZ - ellipsoid zone. ERM - epiretinal membrane. ORA - outer retinal atrophy. ORT - outer retinal tubulation. SRHM - subretinal hyper-reflective material. IRHM - intraretinal hyper-reflective material           ASSESSMENT/PLAN:   ICD-10-CM   1. Proliferative diabetic retinopathy of both eyes without macular edema associated with type 2 diabetes mellitus (HCC)  E11.3593 OCT, Retina - OU - Both Eyes    2. Long term (current) use of oral hypoglycemic drugs  Z79.84     3. Essential hypertension  I10     4. Hypertensive retinopathy of both eyes  H35.033     5. Pseudophakia, both eyes  Z96.1     6. Primary open angle glaucoma (POAG) of both eyes, severe stage  H40.1133      1,2. Proliferative diabetic retinopathy w/o DME, OU  - former [facility] pt -- transferred care due to insurance  - last A1c 7.4 on 01.06.23  - s/p PPV/EL/SO (OD:  02.23.16, OS:08.29.16  -- Dr. Luciana Axe)  - s/p PPV w/ SOR (2022 -- Dr. Luciana Axe) - BCVA CF 3' OD and 20/100 OS - exam shows dense PRP and diffuse atrophy; severe vascular attenuation and disc pallor OU - OCT without diabetic macular edema, both eyes  - discussed findings and prognosis - f/u in 6 wks for DFE/OCT,  IOP check  3,4. Hypertensive retinopathy OU - discussed importance of tight BP control - monitor  5. Pseudophakia OU  - s/p CE/IOL OU  - IOL in good position, doing well  - monitor  6. Severe stage POAG  - initial IOP: 30, 19 by tonopen checked by technician  - after one drop of brimonidine and Cosopt OU -- IOP was 21,11 checked by MD  - started cosopt BID OU on 04.11.24  - IOP improved on today's visit -- 22,18  - 3-4+ disc pallor and +cupping   - continue Cosopt BID OU and add Brimonidine BID OU  - f/u in 6 wks for IOP check  Ophthalmic Meds Ordered this visit:  Meds ordered this encounter  Medications   brimonidine (ALPHAGAN) 0.15 % ophthalmic solution    Sig: Place 1 drop into both eyes every 8 (eight) hours.    Dispense:  5 mL    Refill:  5    Return in about 6 weeks (around 10/05/2022) for f/u PDR OU, DFE, OCT, IOP check.  There are no Patient Instructions on file for this visit.  Explained the diagnoses, plan, and follow up with the patient and they expressed understanding.  Patient expressed understanding of the importance of proper follow up care.   This document serves as a record of services personally performed by Karie Chimera, MD, PhD. It was created on their behalf by Glee Arvin. Manson Passey, OA an ophthalmic technician. The creation of this record is the provider's dictation and/or activities during the visit.    Electronically signed by: Glee Arvin. Manson Passey, [place] 05.09.2024 4:38 PM  This document serves as a record of services personally performed by Karie Chimera, MD, PhD. It was created on their behalf by Gerilyn Nestle, COT an ophthalmic technician. The creation of this record is the provider's dictation and/or activities during the visit.    Electronically signed by:  Gerilyn Nestle, COT  5.23.24 4:38 PM  Karie Chimera, M.D., Ph.D. Diseases & Surgery of the Retina and Vitreous [facility] 08/24/2022  I have reviewed the above  documentation for accuracy and completeness, and I agree with the above. Karie Chimera, M.D., Ph.D. 08/24/22 4:39 PM  Abbreviations: M myopia (nearsighted); A astigmatism; H hyperopia (farsighted); P presbyopia; Mrx spectacle prescription;  CTL contact lenses; OD right eye; OS left eye; OU both eyes  XT exotropia; ET esotropia; PEK punctate epithelial keratitis; PEE punctate epithelial erosions; DES dry eye syndrome; MGD meibomian gland dysfunction; ATs artificial tears; PFAT's preservative free artificial tears; NSC nuclear sclerotic cataract; PSC posterior subcapsular cataract; ERM epi-retinal membrane; PVD posterior vitreous detachment; RD retinal detachment; DM diabetes mellitus; DR diabetic retinopathy; NPDR non-proliferative diabetic retinopathy; PDR proliferative diabetic retinopathy; CSME clinically significant macular edema; DME diabetic macular edema; dbh dot blot hemorrhages; CWS cotton wool spot; POAG primary open angle glaucoma; C/D cup-to-disc ratio; HVF humphrey visual field; GVF goldmann visual field; OCT optical coherence tomography; IOP intraocular pressure; BRVO Branch retinal vein occlusion; CRVO central retinal vein occlusion; CRAO central retinal artery occlusion; BRAO branch retinal artery occlusion; RT retinal tear; SB scleral buckle; PPV pars  plana vitrectomy; VH Vitreous hemorrhage; PRP panretinal laser photocoagulation; IVK intravitreal kenalog; VMT vitreomacular traction; MH Macular hole;  NVD neovascularization of the disc; NVE neovascularization elsewhere; AREDS age related eye disease study; ARMD age related macular degeneration; POAG primary open angle glaucoma; EBMD epithelial/anterior basement membrane dystrophy; ACIOL anterior chamber intraocular lens; IOL intraocular lens; PCIOL posterior chamber intraocular lens; Phaco/IOL phacoemulsification with intraocular lens placement; PRK photorefractive keratectomy; LASIK laser assisted in situ keratomileusis; HTN hypertension; DM  diabetes mellitus; COPD chronic obstructive pulmonary disease

## 2022-08-24 ENCOUNTER — Ambulatory Visit (INDEPENDENT_AMBULATORY_CARE_PROVIDER_SITE_OTHER): Payer: Medicare Other | Admitting: Ophthalmology

## 2022-08-24 ENCOUNTER — Encounter (INDEPENDENT_AMBULATORY_CARE_PROVIDER_SITE_OTHER): Payer: Self-pay | Admitting: Ophthalmology

## 2022-08-24 DIAGNOSIS — H401133 Primary open-angle glaucoma, bilateral, severe stage: Secondary | ICD-10-CM

## 2022-08-24 DIAGNOSIS — H35033 Hypertensive retinopathy, bilateral: Secondary | ICD-10-CM

## 2022-08-24 DIAGNOSIS — I10 Essential (primary) hypertension: Secondary | ICD-10-CM | POA: Diagnosis not present

## 2022-08-24 DIAGNOSIS — Z7984 Long term (current) use of oral hypoglycemic drugs: Secondary | ICD-10-CM

## 2022-08-24 DIAGNOSIS — E113593 Type 2 diabetes mellitus with proliferative diabetic retinopathy without macular edema, bilateral: Secondary | ICD-10-CM | POA: Diagnosis not present

## 2022-08-24 DIAGNOSIS — Z961 Presence of intraocular lens: Secondary | ICD-10-CM

## 2022-08-24 MED ORDER — BRIMONIDINE TARTRATE 0.15 % OP SOLN: 1.0000 [drp] | Freq: Three times a day (TID) | OPHTHALMIC | 5 refills | Status: AC

## 2022-08-25 DIAGNOSIS — G720 Drug-induced myopathy: Secondary | ICD-10-CM | POA: Diagnosis not present

## 2022-08-25 DIAGNOSIS — R197 Diarrhea, unspecified: Secondary | ICD-10-CM | POA: Diagnosis not present

## 2022-08-25 DIAGNOSIS — E1169 Type 2 diabetes mellitus with other specified complication: Secondary | ICD-10-CM | POA: Diagnosis not present

## 2022-08-25 DIAGNOSIS — E11319 Type 2 diabetes mellitus with unspecified diabetic retinopathy without macular edema: Secondary | ICD-10-CM | POA: Diagnosis not present

## 2022-08-25 DIAGNOSIS — R2990 Unspecified symptoms and signs involving the nervous system: Secondary | ICD-10-CM | POA: Diagnosis not present

## 2022-08-25 DIAGNOSIS — L293 Anogenital pruritus, unspecified: Secondary | ICD-10-CM | POA: Diagnosis not present

## 2022-08-25 DIAGNOSIS — L298 Other pruritus: Secondary | ICD-10-CM | POA: Diagnosis not present

## 2022-08-25 DIAGNOSIS — Z7984 Long term (current) use of oral hypoglycemic drugs: Secondary | ICD-10-CM | POA: Diagnosis not present

## 2022-09-19 DIAGNOSIS — E1165 Type 2 diabetes mellitus with hyperglycemia: Secondary | ICD-10-CM | POA: Diagnosis not present

## 2022-09-19 DIAGNOSIS — I10 Essential (primary) hypertension: Secondary | ICD-10-CM | POA: Diagnosis not present

## 2022-09-19 DIAGNOSIS — E782 Mixed hyperlipidemia: Secondary | ICD-10-CM | POA: Diagnosis not present

## 2022-09-25 DIAGNOSIS — I10 Essential (primary) hypertension: Secondary | ICD-10-CM | POA: Diagnosis not present

## 2022-09-25 DIAGNOSIS — E11319 Type 2 diabetes mellitus with unspecified diabetic retinopathy without macular edema: Secondary | ICD-10-CM | POA: Diagnosis not present

## 2022-09-25 DIAGNOSIS — R197 Diarrhea, unspecified: Secondary | ICD-10-CM | POA: Diagnosis not present

## 2022-09-25 DIAGNOSIS — R2990 Unspecified symptoms and signs involving the nervous system: Secondary | ICD-10-CM | POA: Diagnosis not present

## 2022-09-25 DIAGNOSIS — H3500 Unspecified background retinopathy: Secondary | ICD-10-CM | POA: Diagnosis not present

## 2022-09-25 DIAGNOSIS — Z89429 Acquired absence of other toe(s), unspecified side: Secondary | ICD-10-CM | POA: Diagnosis not present

## 2022-09-25 DIAGNOSIS — G720 Drug-induced myopathy: Secondary | ICD-10-CM | POA: Diagnosis not present

## 2022-09-25 DIAGNOSIS — I471 Supraventricular tachycardia, unspecified: Secondary | ICD-10-CM | POA: Diagnosis not present

## 2022-09-25 DIAGNOSIS — E048 Other specified nontoxic goiter: Secondary | ICD-10-CM | POA: Diagnosis not present

## 2022-09-25 DIAGNOSIS — E119 Type 2 diabetes mellitus without complications: Secondary | ICD-10-CM | POA: Diagnosis not present

## 2022-09-27 NOTE — Progress Notes (Signed)
[facility] - Clinic Note  10/04/2022     CHIEF COMPLAINT Patient presents for Retina Follow Up   HISTORY OF PRESENT ILLNESS: [name] is a 67 y.o. female who presents to the clinic today for:  HPI     Retina Follow Up   Patient presents with  Diabetic Retinopathy.  In both eyes.  This started 6 weeks ago.  Duration of 6 weeks.  Since onset it is stable.  I, the attending physician,  performed the HPI with the patient and updated documentation appropriately.        Comments   Retina follow up pt is reporting vision seems stable she has noticed some wavy lines or other patterns at times she denies any flashes her last reading was 180 this morning A1C 7.7      Last edited by Rennis Chris, MD on 10/05/2022 12:01 AM.     Referring physician: Benita Stabile, MD [address] [place],  [place] [address]  HISTORICAL INFORMATION:  Selected notes from the MEDICAL RECORD NUMBER Previous Dr. Luciana Axe pt  LEE: 09.18.23 Ocular Hx- PDR OU h/o PPV w/ silicon oil OU (2016), PPV w/ SO removal OU in 2022 PMH-   CURRENT MEDICATIONS: Current Outpatient Medications (Ophthalmic Drugs)  Medication Sig   brimonidine (ALPHAGAN) 0.2 % ophthalmic solution Place 1 drop into both eyes 2 (two) times daily.   brimonidine (ALPHAGAN) 0.15 % ophthalmic solution Place 1 drop into both eyes every 8 (eight) hours.   dorzolamide-timolol (COSOPT) 2-0.5 % ophthalmic solution Place 1 drop into both eyes 2 (two) times daily.   No current facility-administered medications for this visit. (Ophthalmic Drugs)   Current Outpatient Medications (Other)  Medication Sig   aspirin EC 81 MG tablet Take 81 mg by mouth daily. Swallow whole.   b complex vitamins capsule Take 1 capsule by mouth daily.   Echinacea-Goldenseal (ECHINACEA COMB/GOLDEN SEAL PO) Take 1,350 mg by mouth daily.   glipiZIDE (GLUCOTROL) 10 MG tablet Take 10 mg by mouth 2 (two) times daily.   Glucosamine-Chondroitin (OSTEO  BI-FLEX REGULAR STRENGTH PO) Take 2 tablets by mouth daily.   metoprolol tartrate (LOPRESSOR) 25 MG tablet Take 1 tablet (25 mg total) by mouth 2 (two) times daily.   Multiple Vitamins-Minerals (OCUVITE ADULT 50+ PO) Take 1 tablet by mouth daily.   Multiple Vitamins-Minerals (PRESERVISION AREDS 2) CAPS Take 1 capsule by mouth daily.   NEXLETOL 180 MG TABS Take 180 mg by mouth daily.   Semaglutide (RYBELSUS) 7 MG TABS Take 7 mg by mouth.   SYNJARDY XR 01-999 MG TB24 Take 1 tablet by mouth 2 (two) times daily.   Turmeric 500 MG CAPS Take 1,500 mg by mouth daily.   zinc gluconate 50 MG tablet Take 50 mg by mouth daily.   No current facility-administered medications for this visit. (Other)   REVIEW OF SYSTEMS: ROS   Negative for: Constitutional, Gastrointestinal, Neurological, Skin, Genitourinary, Musculoskeletal, HENT, Endocrine, Cardiovascular, Eyes, Respiratory, Psychiatric, Allergic/Imm, Heme/Lymph Last edited by Etheleen Mayhew, COT on 10/04/2022  8:57 AM.       ALLERGIES Allergies  Allergen Reactions   Anesthetics, Amide Nausea And Vomiting   Codeine Nausea And Vomiting   PAST MEDICAL HISTORY Past Medical History:  Diagnosis Date   Arthritis    OSTEO   Diabetes mellitus without complication (HCC)    type 2   Family history of adverse reaction to anesthesia    Mother - N/V   Goiter  Hyperlipidemia    Hypertension    PONV (postoperative nausea and vomiting)    Retinopathy    Past Surgical History:  Procedure Laterality Date   ABDOMINAL AORTOGRAM W/LOWER EXTREMITY N/A 03/31/2020   Procedure: ABDOMINAL AORTOGRAM W/LOWER EXTREMITY;  Surgeon: Leonie Douglas, MD;  Location: [facility];  Service: Cardiovascular;  Laterality: N/A;   ABDOMINAL HYSTERECTOMY     AIR/FLUID EXCHANGE Left 11/30/2014   Procedure: AIR/FLUID EXCHANGE LEFT EYE;  Surgeon: Edmon Crape, MD;  Location: [facility] OR;  Service: Ophthalmology;  Laterality: Left;   AMPUTATION Left 06/11/2020    Procedure: LEFT GREAT TOE AMPUTATION;  Surgeon: Nadara Mustard, MD;  Location: [facility] OR;  Service: Orthopedics;  Laterality: Left;   AMPUTATION Left 07/13/2021   Procedure: LEFT SECOND AND THIRD TOE AMPUTATION;  Surgeon: Nadara Mustard, MD;  Location: [facility] OR;  Service: Orthopedics;  Laterality: Left;   BACK SURGERY     CESAREAN SECTION     x 2   EYE SURGERY     INJECTION OF SILICONE OIL Right 05/26/2014   Procedure: INJECTION OF SILICONE OIL;  Surgeon: Edmon Crape, MD;  Location: [facility] OR;  Service: Ophthalmology;  Laterality: Right;   INJECTION OF SILICONE OIL Left 11/30/2014   Procedure: INJECTION OF SILICONE OIL;  Surgeon: Edmon Crape, MD;  Location: [facility] OR;  Service: Ophthalmology;  Laterality: Left;   LASER PHOTO ABLATION Right 05/26/2014   Procedure: LASER PHOTO ABLATION;  Surgeon: Edmon Crape, MD;  Location: [facility] OR;  Service: Ophthalmology;  Laterality: Right;   PARS PLANA VITRECTOMY Right 05/26/2014   Procedure: PARS PLANA VITRECTOMY 25 GAUGE;  Surgeon: Edmon Crape, MD;  Location: [facility] OR;  Service: Ophthalmology;  Laterality: Right;   PARS PLANA VITRECTOMY Left 11/30/2014   Procedure: PARS PLANA VITRECTOMY WITH 25 GAUGE with endolaser;  Surgeon: Edmon Crape, MD;  Location: [facility] OR;  Service: Ophthalmology;  Laterality: Left;   PERIPHERAL VASCULAR INTERVENTION Left 03/31/2020   Procedure: PERIPHERAL VASCULAR INTERVENTION;  Surgeon: Leonie Douglas, MD;  Location: [facility];  Service: Cardiovascular;  Laterality: Left;  superficial femoral   TONSILLECTOMY     TOTAL HIP ARTHROPLASTY Right 04/21/2014   dr Eulah Pont   TOTAL HIP ARTHROPLASTY Right 04/21/2014   Procedure: RIGHT TOTAL HIP ARTHROPLASTY ANTERIOR APPROACH;  Surgeon: Sheral Apley, MD;  Location: [facility] OR;  Service: Orthopedics;  Laterality: Right;   TUBAL LIGATION     FAMILY HISTORY Family History  Problem Relation Age of Onset   Healthy Mother    Healthy Father    SOCIAL HISTORY Social History   Tobacco Use    Smoking status: Former   Smokeless tobacco: Never   Tobacco comments:    smoked as a teenager  Building services engineer Use: Never used  Substance Use Topics   Alcohol use: Yes    Comment: pt may have one drink a month   Drug use: No       OPHTHALMIC EXAM:  Base Eye Exam     Visual Acuity (Snellen - Linear)       Right Left   Dist [place] CF at 3' 20/150   Dist ph [place] NI NI         Tonometry (Tonopen, 9:00 AM)       Right Left   Pressure 20 17         Pupils       Pupils Dark Light Shape React APD  Right PERRL 3 2 Round Brisk None   Left PERRL 3 2 Round Brisk None         Visual Fields       Left Right    Full    Restrictions  Total superior temporal, superior nasal, inferior nasal deficiencies; Partial inner inferior temporal deficiency         Extraocular Movement       Right Left    Full, Ortho Full, Ortho         Neuro/Psych     Oriented x3: Yes   Mood/Affect: Normal         Dilation     Both eyes: 2.5% Phenylephrine @ 8:58 AM           Slit Lamp and Fundus Exam     Slit Lamp Exam       Right Left   Lids/Lashes Dermatochalasis - upper lid, mild MGD Dermatochalasis - upper lid   Conjunctiva/Sclera nasal pingeucula Silicone oil cysts   Cornea tear film debris, punctate KP Trace Punctate epithelial erosions   Anterior Chamber deep, 0.5+cell/pigment deep and clear, No cell or pigment   Iris Round and moderately dilated Round and moderately dilated, TID at 0200   Lens PC IOL in good position PC IOL in good position   Anterior Vitreous post vitrectomy post vitrectomy, residual anterior hyaloid visible         Fundus Exam       Right Left   Disc 3-4+pallor, +cupping 2-3+pallor, +cupping   C/D Ratio 0.8 0.7   Macula Flat, Blunted foveal reflex, central atrophy, mild pigment clumping, No heme or edema Flat, Blunted foveal reflex, diffuse CR atrophy / laser scars   Vessels Severe attenuation, no active NV mild attenuation, Tortuous    Periphery Attached, dense 360 PRP, rare MA Attached, dense 360 PRP            IMAGING AND PROCEDURES  Imaging and Procedures for 10/04/2022  OCT, Retina - OU - Both Eyes       Right Eye Quality was good. Central Foveal Thickness: 161. Progression has been stable. Findings include normal foveal contour, no IRF, no SRF, epiretinal membrane, macular pucker, outer retinal atrophy (Diffuse atrophy with subfoveal thinning, mild ERM and pucker).   Left Eye Quality was good. Central Foveal Thickness: 313. Progression has been stable. Findings include normal foveal contour, no IRF, no SRF, outer retinal atrophy (Diffuse ORA).   Notes *Images captured and stored on drive  Diagnosis / Impression:  OD: Diffuse atrophy with subfoveal thinning / GA, mild ERM and pucker OS: Diffuse ORA  Clinical management:  See below  Abbreviations: NFP - Normal foveal profile. CME - cystoid macular edema. PED - pigment epithelial detachment. IRF - intraretinal fluid. SRF - subretinal fluid. EZ - ellipsoid zone. ERM - epiretinal membrane. ORA - outer retinal atrophy. ORT - outer retinal tubulation. SRHM - subretinal hyper-reflective material. IRHM - intraretinal hyper-reflective material           ASSESSMENT/PLAN:   ICD-10-CM   1. Proliferative diabetic retinopathy of both eyes without macular edema associated with type 2 diabetes mellitus (HCC)  E11.3593 OCT, Retina - OU - Both Eyes    2. Long term (current) use of oral hypoglycemic drugs  Z79.84     3. Essential hypertension  I10     4. Hypertensive retinopathy of both eyes  H35.033     5. Pseudophakia, both eyes  Z96.1     6. Primary  open angle glaucoma (POAG) of both eyes, severe stage  H40.1133      1,2. Proliferative diabetic retinopathy w/o DME, OU  - former [facility] pt -- transferred care due to insurance  - last A1c 7.4 on 01.06.23  - s/p PPV/EL/SO (OD: 02.23.16, OS:08.29.16  -- Dr. Luciana Axe)  - s/p PPV w/ SOR (2022 -- Dr. Luciana Axe) - BCVA  CF 3' OD and 20/150 OS - exam shows dense PRP and diffuse atrophy; severe vascular attenuation and disc pallor OU - OCT without diabetic macular edema, both eyes -- stable - discussed findings and prognosis - no retinal or ophthalmic interventions indicated or recommended  - f/u in 1 year for DFE/OCT  3,4. Hypertensive retinopathy OU - discussed importance of tight BP control - monitor  5. Pseudophakia OU  - s/p CE/IOL OU  - IOL in good position, doing well  - monitor  6. Severe stage POAG  - started cosopt BID OU on 04.11.24, brimonindine BID OU on 05.23.24  - IOP improved on today's visit -- 20,17  - 3-4+ disc pallor and +cupping   - continue Cosopt BID OU and Brimonidine BID OU  - monitor  - f/u 1 yr  Ophthalmic Meds Ordered this visit:  Meds ordered this encounter  Medications   brimonidine (ALPHAGAN) 0.2 % ophthalmic solution    Sig: Place 1 drop into both eyes 2 (two) times daily.    Dispense:  10 mL    Refill:  10   dorzolamide-timolol (COSOPT) 2-0.5 % ophthalmic solution    Sig: Place 1 drop into both eyes 2 (two) times daily.    Dispense:  10 mL    Refill:  10    Return in about 1 year (around 10/04/2023) for low vision PDR, Dilated Exam, OCT.  There are no Patient Instructions on file for this visit.  Explained the diagnoses, plan, and follow up with the patient and they expressed understanding.  Patient expressed understanding of the importance of proper follow up care.   This document serves as a record of services personally performed by Karie Chimera, MD, PhD. It was created on their behalf by De Blanch, an ophthalmic technician. The creation of this record is the provider's dictation and/or activities during the visit.    Electronically signed by: De Blanch, OA, 10/05/22  12:02 AM  This document serves as a record of services personally performed by Karie Chimera, MD, PhD. It was created on their behalf by Glee Arvin. Manson Passey, OA an ophthalmic  technician. The creation of this record is the provider's dictation and/or activities during the visit.    Electronically signed by: Glee Arvin. Manson Passey, OA 10/05/22 12:02 AM  Karie Chimera, M.D., Ph.D. Diseases & Surgery of the Retina and Vitreous [facility] 10/04/2022  I have reviewed the above documentation for accuracy and completeness, and I agree with the above. Karie Chimera, M.D., Ph.D. 10/05/22 12:04 AM   Abbreviations: M myopia (nearsighted); A astigmatism; H hyperopia (farsighted); P presbyopia; Mrx spectacle prescription;  CTL contact lenses; OD right eye; OS left eye; OU both eyes  XT exotropia; ET esotropia; PEK punctate epithelial keratitis; PEE punctate epithelial erosions; DES dry eye syndrome; MGD meibomian gland dysfunction; ATs artificial tears; PFAT's preservative free artificial tears; NSC nuclear sclerotic cataract; PSC posterior subcapsular cataract; ERM epi-retinal membrane; PVD posterior vitreous detachment; RD retinal detachment; DM diabetes mellitus; DR diabetic retinopathy; NPDR non-proliferative diabetic retinopathy; PDR proliferative diabetic retinopathy; CSME clinically significant macular edema;  DME diabetic macular edema; dbh dot blot hemorrhages; CWS cotton wool spot; POAG primary open angle glaucoma; C/D cup-to-disc ratio; HVF humphrey visual field; GVF goldmann visual field; OCT optical coherence tomography; IOP intraocular pressure; BRVO Branch retinal vein occlusion; CRVO central retinal vein occlusion; CRAO central retinal artery occlusion; BRAO branch retinal artery occlusion; RT retinal tear; SB scleral buckle; PPV pars plana vitrectomy; VH Vitreous hemorrhage; PRP panretinal laser photocoagulation; IVK intravitreal kenalog; VMT vitreomacular traction; MH Macular hole;  NVD neovascularization of the disc; NVE neovascularization elsewhere; AREDS age related eye disease study; ARMD age related macular degeneration; POAG primary open angle  glaucoma; EBMD epithelial/anterior basement membrane dystrophy; ACIOL anterior chamber intraocular lens; IOL intraocular lens; PCIOL posterior chamber intraocular lens; Phaco/IOL phacoemulsification with intraocular lens placement; PRK photorefractive keratectomy; LASIK laser assisted in situ keratomileusis; HTN hypertension; DM diabetes mellitus; COPD chronic obstructive pulmonary disease

## 2022-10-04 ENCOUNTER — Encounter (INDEPENDENT_AMBULATORY_CARE_PROVIDER_SITE_OTHER): Payer: Self-pay | Admitting: Ophthalmology

## 2022-10-04 ENCOUNTER — Ambulatory Visit (INDEPENDENT_AMBULATORY_CARE_PROVIDER_SITE_OTHER): Payer: Medicare Other | Admitting: Ophthalmology

## 2022-10-04 DIAGNOSIS — H401133 Primary open-angle glaucoma, bilateral, severe stage: Secondary | ICD-10-CM

## 2022-10-04 DIAGNOSIS — E113593 Type 2 diabetes mellitus with proliferative diabetic retinopathy without macular edema, bilateral: Secondary | ICD-10-CM | POA: Diagnosis not present

## 2022-10-04 DIAGNOSIS — Z7984 Long term (current) use of oral hypoglycemic drugs: Secondary | ICD-10-CM

## 2022-10-04 DIAGNOSIS — H35033 Hypertensive retinopathy, bilateral: Secondary | ICD-10-CM

## 2022-10-04 DIAGNOSIS — I10 Essential (primary) hypertension: Secondary | ICD-10-CM | POA: Diagnosis not present

## 2022-10-04 DIAGNOSIS — Z961 Presence of intraocular lens: Secondary | ICD-10-CM

## 2022-10-04 MED ORDER — BRIMONIDINE TARTRATE 0.2 % OP SOLN: 1.0000 [drp] | Freq: Two times a day (BID) | OPHTHALMIC | 10 refills | Status: DC

## 2022-10-04 MED ORDER — DORZOLAMIDE HCL-TIMOLOL MAL 2-0.5 % OP SOLN: 1.0000 [drp] | Freq: Two times a day (BID) | OPHTHALMIC | 10 refills | Status: DC

## 2022-10-05 ENCOUNTER — Encounter (INDEPENDENT_AMBULATORY_CARE_PROVIDER_SITE_OTHER): Payer: Self-pay | Admitting: Ophthalmology

## 2023-01-17 DIAGNOSIS — E048 Other specified nontoxic goiter: Secondary | ICD-10-CM | POA: Diagnosis not present

## 2023-01-17 DIAGNOSIS — E1165 Type 2 diabetes mellitus with hyperglycemia: Secondary | ICD-10-CM | POA: Diagnosis not present

## 2023-01-25 DIAGNOSIS — E049 Nontoxic goiter, unspecified: Secondary | ICD-10-CM | POA: Diagnosis not present

## 2023-01-25 DIAGNOSIS — Z0001 Encounter for general adult medical examination with abnormal findings: Secondary | ICD-10-CM | POA: Diagnosis not present

## 2023-01-25 DIAGNOSIS — H3500 Unspecified background retinopathy: Secondary | ICD-10-CM | POA: Diagnosis not present

## 2023-01-25 DIAGNOSIS — I471 Supraventricular tachycardia, unspecified: Secondary | ICD-10-CM | POA: Diagnosis not present

## 2023-01-25 DIAGNOSIS — Z0000 Encounter for general adult medical examination without abnormal findings: Secondary | ICD-10-CM | POA: Diagnosis not present

## 2023-01-25 DIAGNOSIS — Z89429 Acquired absence of other toe(s), unspecified side: Secondary | ICD-10-CM | POA: Diagnosis not present

## 2023-01-25 DIAGNOSIS — R197 Diarrhea, unspecified: Secondary | ICD-10-CM | POA: Diagnosis not present

## 2023-01-25 DIAGNOSIS — I10 Essential (primary) hypertension: Secondary | ICD-10-CM | POA: Diagnosis not present

## 2023-01-25 DIAGNOSIS — G720 Drug-induced myopathy: Secondary | ICD-10-CM | POA: Diagnosis not present

## 2023-01-25 DIAGNOSIS — E11319 Type 2 diabetes mellitus with unspecified diabetic retinopathy without macular edema: Secondary | ICD-10-CM | POA: Diagnosis not present

## 2023-02-27 IMAGING — MR MR FOOT*L* WO/W CM
8 series · 40 of 40 positions shown · IV contrast (8 ml Gadavist)
Comparison: X-ray 06/02/2020

CLINICAL DATA: Diabetic ulceration of the left great toe

EXAM:
MRI OF THE LEFT FOREFOOT WITHOUT AND WITH CONTRAST
TECHNIQUE: Multiplanar, multisequence MR imaging of the left forefoot was
performed both before and after administration of intravenous
contrast.
CONTRAST:  8mL GADAVIST GADOBUTROL 1 MMOL/ML IV SOLN

[Series 3: T1 · axial · left · 3.0mm · 0.70mm/px · z∈[-33,+25]mm · 2 of 16 slices shown (1 of 2)]
[im 1/16]
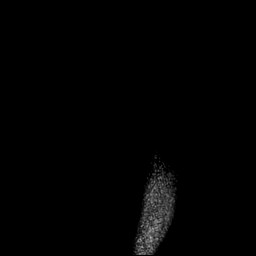
[im 16/16]
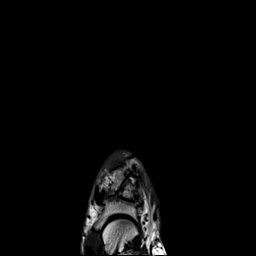

[Series 4: T2 · axial · left · 3.0mm · 0.70mm/px · z∈[-33,+25]mm · 2 of 16 slices shown (1 of 2)]
[im 1/16]
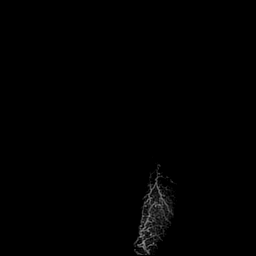
[im 16/16]
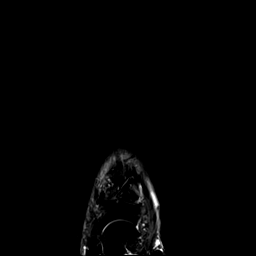

[Series 5: T1 · coronal · left · 3.0mm · 0.38mm/px · 7 of 34 slices shown (2 of 2)]
[im 1/34]
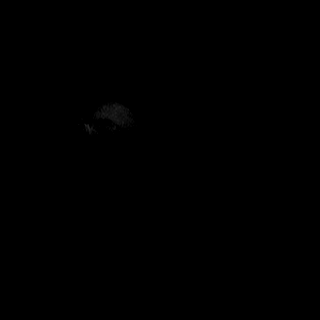
[im 6/34]
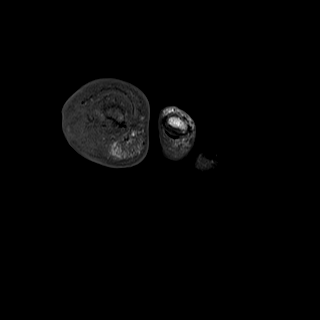
[im 12/34]
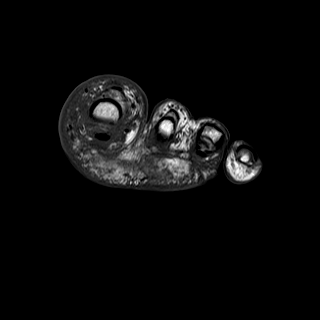
[im 17/34]
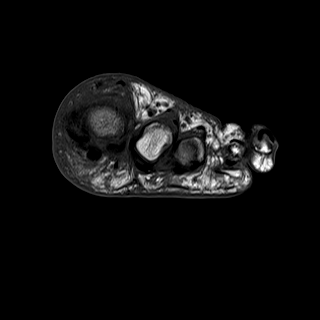
[im 23/34]
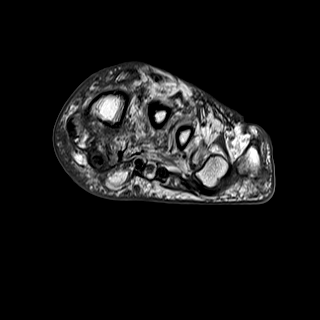
[im 28/34]
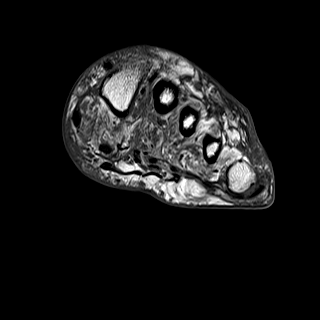
[im 34/34]
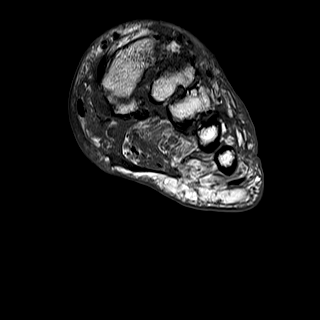

[Series 6: T2 · coronal · left · 3.0mm · 0.47mm/px · 7 of 34 slices shown (2 of 2)]
[im 1/34]
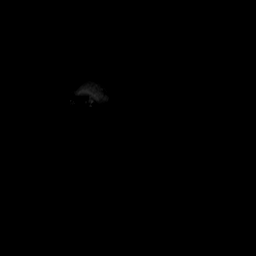
[im 6/34]
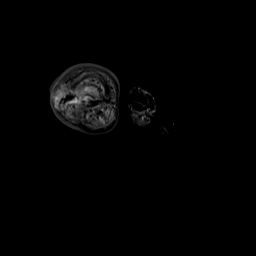
[im 12/34]
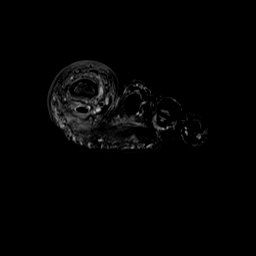
[im 17/34]
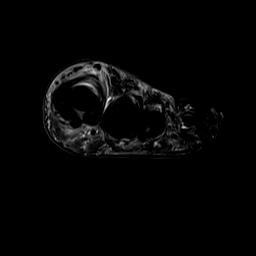
[im 23/34]
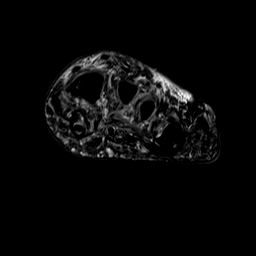
[im 28/34]
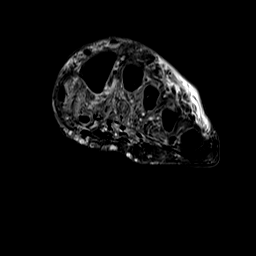
[im 34/34]
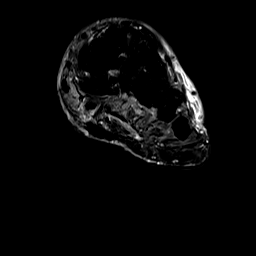

[Series 7: STIR · sagittal · left · 3.0mm · 0.35mm/px · 5 of 25 slices shown]
[im 1/25]
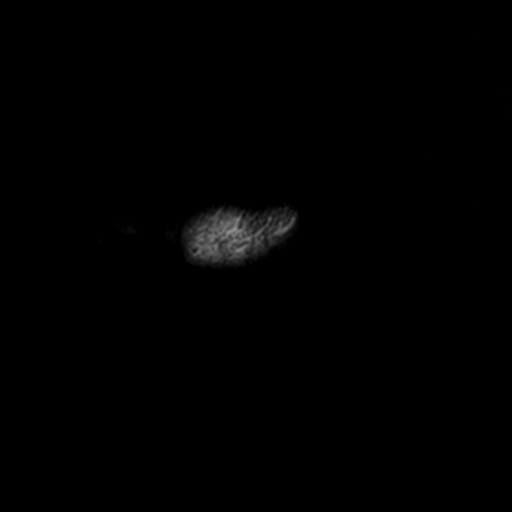
[im 7/25]
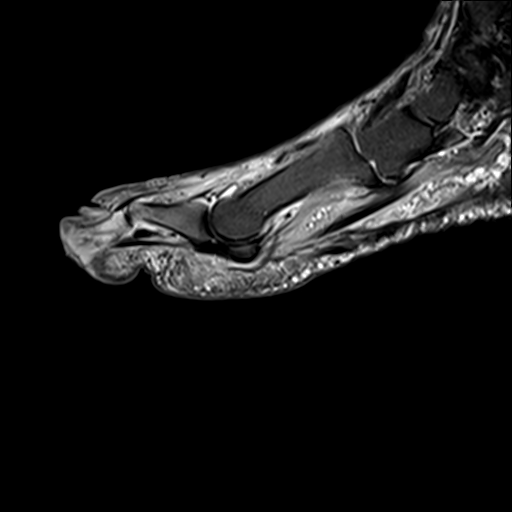
[im 13/25]
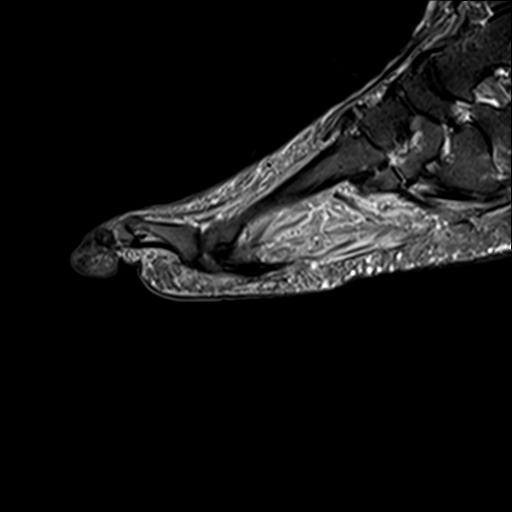
[im 19/25]
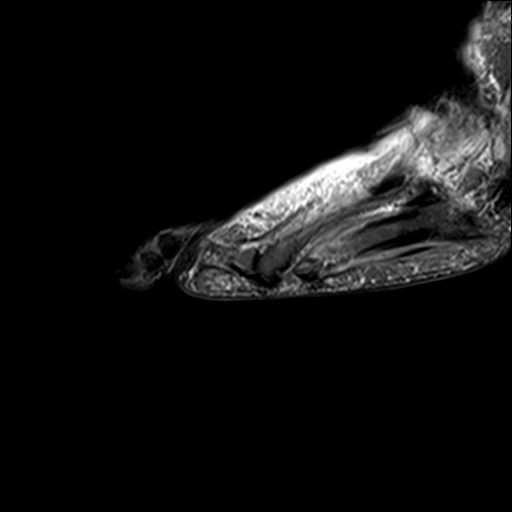
[im 25/25]
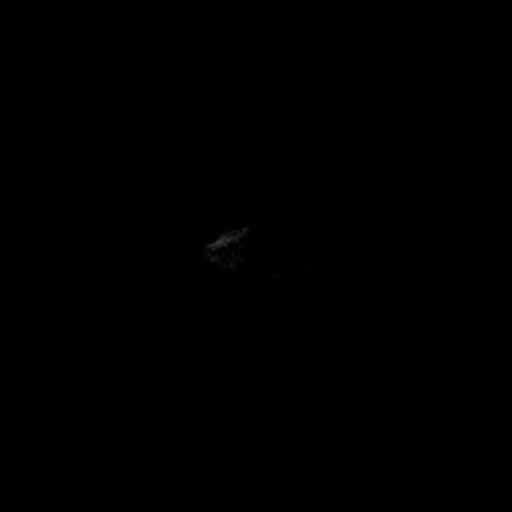

[Series 8: T1 fat-sat · coronal · non-contrast · left · 3.0mm · 0.38mm/px · 7 of 34 slices shown]
[im 1/34]
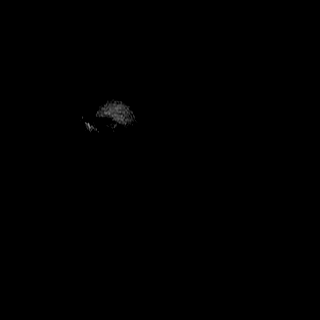
[im 6/34]
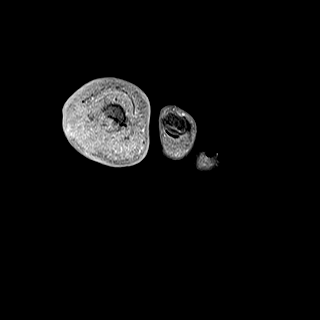
[im 12/34]
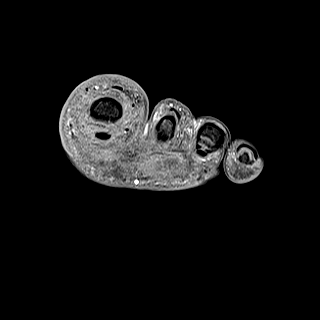
[im 17/34]
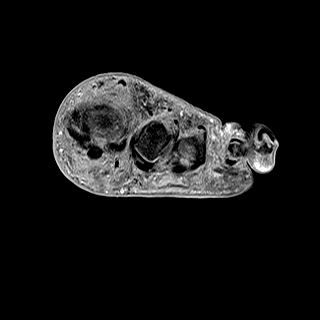
[im 23/34]
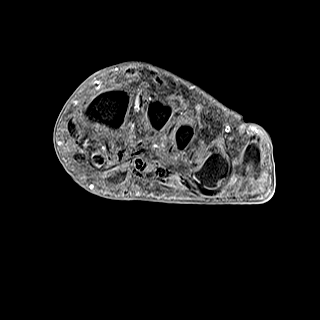
[im 28/34]
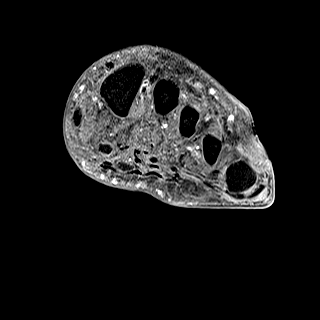
[im 34/34]
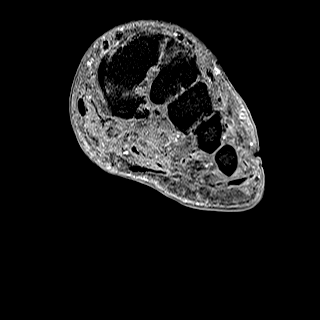

[Series 9: T1 fat-sat post-contrast · coronal · left · 3.0mm · 0.38mm/px · 7 of 34 slices shown (1 of 2)]
[im 1/34]
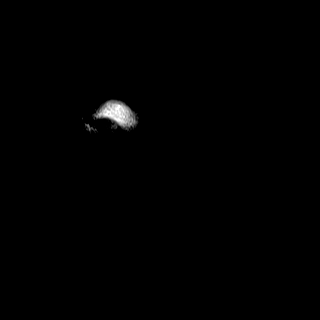
[im 6/34]
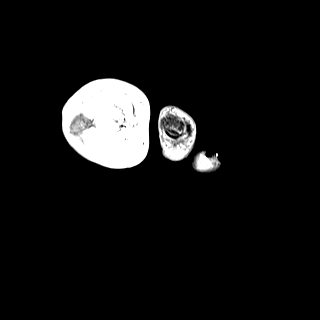
[im 12/34]
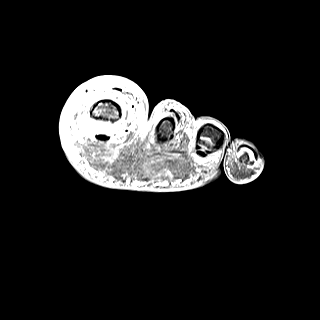
[im 17/34]
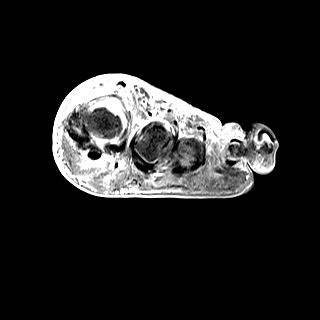
[im 23/34]
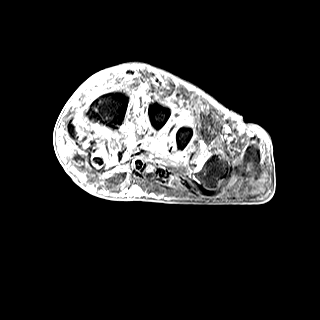
[im 28/34]
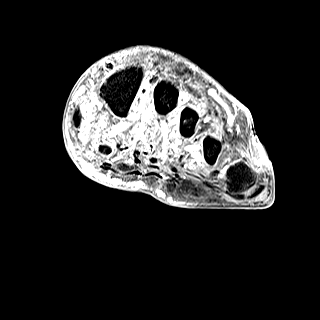
[im 34/34]
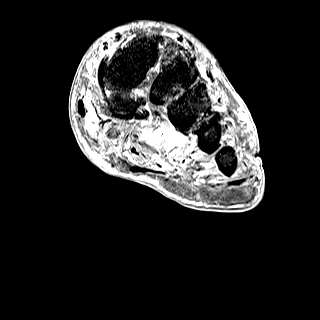

[Series 10: T1 fat-sat post-contrast · axial · left · 3.0mm · 0.70mm/px · z∈[-33,+25]mm · 3 of 15 slices shown (2 of 2)]
[im 1/15]
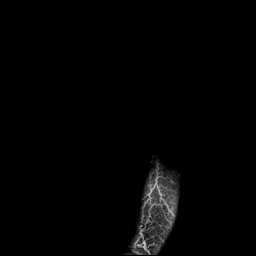
[im 8/15]
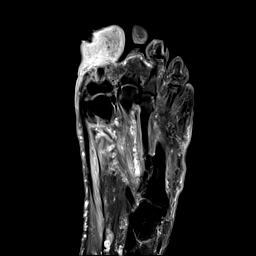
[im 15/15]
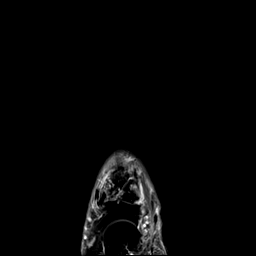

[40 of 40 positions shown; findings below may reference images not displayed]

FINDINGS: Bones/Joint/Cartilage

Acute osteomyelitis of the distal phalanx of the left great toe with
cortical destruction of the distal tuft. Marked bone marrow edema
and enhancement throughout the great toe distal phalanx with
associated confluent low T1 bone marrow signal. Trace joint effusion
of the great toe interphalangeal joint suspicious for septic
arthritis. Bone marrow edema within the distal aspect of the great
toe proximal phalanx adjacent to the IP joint is concerning for
early acute osteomyelitis.

Remaining osseous structures of the forefoot are intact. No
additional sites of erosion or marrow replacement. Mild degenerative
changes throughout the forefoot and midfoot.

Ligaments

Intact Lisfranc ligament. Collateral ligaments of the forefoot
appear intact.

Muscles and Tendons

Atrophy and fatty infiltration of the intrinsic foot musculature
compatible with chronic denervation changes. Mild diffuse edema-like
intramuscular signal which may reflect denervation and/or myositis.
Trace flexor hallucis longus tenosynovitis.

Soft tissues

Soft tissue ulceration at the distal aspect of the left great toe
along its medial surface. There is a peripherally enhancing sinus
tract/fluid collection measuring up to 2.4 cm which appears
contiguous with the great toe IP joint (series 10, images 7-8;
series 9, images 5-10). Extensive surrounding cellulitis of the
great toe.
IMPRESSION: 1. Acute osteomyelitis of the distal phalanx of the left great toe.
Bone marrow edema within the distal aspect of the great toe proximal
phalanx adjacent to the IP joint is concerning for early acute
osteomyelitis.
2. Soft tissue ulceration at the distal aspect of the left great toe
with sinus tract/fluid collection measuring up to 2.4 cm which
appears contiguous with the great toe IP joint. Findings highly
suspicious for septic arthritis.
3. Trace flexor hallucis longus tenosynovitis.
4. Diffuse edema-like intramuscular signal throughout the forefoot
which may reflect denervation and/or myositis.

## 2023-04-30 DIAGNOSIS — E1165 Type 2 diabetes mellitus with hyperglycemia: Secondary | ICD-10-CM | POA: Diagnosis not present

## 2023-04-30 DIAGNOSIS — E041 Nontoxic single thyroid nodule: Secondary | ICD-10-CM | POA: Diagnosis not present

## 2023-05-04 DIAGNOSIS — Z7985 Long-term (current) use of injectable non-insulin antidiabetic drugs: Secondary | ICD-10-CM | POA: Diagnosis not present

## 2023-05-04 DIAGNOSIS — I471 Supraventricular tachycardia, unspecified: Secondary | ICD-10-CM | POA: Diagnosis not present

## 2023-05-04 DIAGNOSIS — E11319 Type 2 diabetes mellitus with unspecified diabetic retinopathy without macular edema: Secondary | ICD-10-CM | POA: Diagnosis not present

## 2023-05-04 DIAGNOSIS — Z89429 Acquired absence of other toe(s), unspecified side: Secondary | ICD-10-CM | POA: Diagnosis not present

## 2023-05-04 DIAGNOSIS — G720 Drug-induced myopathy: Secondary | ICD-10-CM | POA: Diagnosis not present

## 2023-05-04 DIAGNOSIS — H3500 Unspecified background retinopathy: Secondary | ICD-10-CM | POA: Diagnosis not present

## 2023-05-04 DIAGNOSIS — E1169 Type 2 diabetes mellitus with other specified complication: Secondary | ICD-10-CM | POA: Diagnosis not present

## 2023-05-04 DIAGNOSIS — E048 Other specified nontoxic goiter: Secondary | ICD-10-CM | POA: Diagnosis not present

## 2023-05-04 DIAGNOSIS — I10 Essential (primary) hypertension: Secondary | ICD-10-CM | POA: Diagnosis not present

## 2023-08-22 DIAGNOSIS — E11319 Type 2 diabetes mellitus with unspecified diabetic retinopathy without macular edema: Secondary | ICD-10-CM | POA: Diagnosis not present

## 2023-08-22 DIAGNOSIS — I10 Essential (primary) hypertension: Secondary | ICD-10-CM | POA: Diagnosis not present

## 2023-08-30 DIAGNOSIS — I471 Supraventricular tachycardia, unspecified: Secondary | ICD-10-CM | POA: Diagnosis not present

## 2023-08-30 DIAGNOSIS — I10 Essential (primary) hypertension: Secondary | ICD-10-CM | POA: Diagnosis not present

## 2023-08-30 DIAGNOSIS — Z79899 Other long term (current) drug therapy: Secondary | ICD-10-CM | POA: Diagnosis not present

## 2023-08-30 DIAGNOSIS — E11319 Type 2 diabetes mellitus with unspecified diabetic retinopathy without macular edema: Secondary | ICD-10-CM | POA: Diagnosis not present

## 2023-08-30 DIAGNOSIS — G720 Drug-induced myopathy: Secondary | ICD-10-CM | POA: Diagnosis not present

## 2023-08-30 DIAGNOSIS — E048 Other specified nontoxic goiter: Secondary | ICD-10-CM | POA: Diagnosis not present

## 2023-08-30 DIAGNOSIS — H3500 Unspecified background retinopathy: Secondary | ICD-10-CM | POA: Diagnosis not present

## 2023-09-26 NOTE — Progress Notes (Signed)
 [facility] - Clinic Note  10/03/2023     CHIEF COMPLAINT Patient presents for Retina Follow Up   HISTORY OF PRESENT ILLNESS: [name] is a 68 y.o. female who presents to the clinic today for:  HPI     Retina Follow Up   Patient presents with  Diabetic Retinopathy.  In both eyes.  This started 3 years ago.  Duration of 12 months.  Since onset it is stable.  I, the attending physician,  performed the HPI with the patient and updated documentation appropriately.        Comments   Pt states vision has been about the same. Pt denies FOL/floaters pain. Pt does not use ATS. Pt states she is consistent with Cosopt  BID OU and Brimonidine  BID OU. A1c=6.09 Aug 2023 BS=115 this morning.      Last edited by Valdemar Rogue, MD on 10/03/2023  4:06 PM.     Pt states  Referring physician: Shona Norleen PEDLAR, MD [address] [place],  [place] [address]  HISTORICAL INFORMATION:  Selected notes from the MEDICAL RECORD NUMBER Previous Dr. Elner pt  LEE: 09.18.23 Ocular Hx- PDR OU h/o PPV w/ silicon oil OU (2016), PPV w/ SO removal OU in 2022 PM[facility]-   CURRENT MEDICATIONS: Current Outpatient Medications (Ophthalmic Drugs)  Medication Sig   brimonidine  (ALPHAGAN ) 0.2 % ophthalmic solution Place 1 drop into both eyes 2 (two) times daily.   dorzolamide -timolol  (COSOPT ) 2-0.5 % ophthalmic solution Place 1 drop into both eyes 2 (two) times daily.   No current facility-administered medications for this visit. (Ophthalmic Drugs)   Current Outpatient Medications (Other)  Medication Sig   aspirin  EC 81 MG tablet Take 81 mg by mouth daily. Swallow whole.   b complex vitamins capsule Take 1 capsule by mouth daily.   Echinacea-Goldenseal (ECHINACEA COMB/GOLDEN SEAL PO) Take 1,350 mg by mouth daily.   glipiZIDE  (GLUCOTROL ) 10 MG tablet Take 10 mg by mouth 2 (two) times daily.   Glucosamine-Chondroitin (OSTEO BI-FLEX REGULAR STRENGTH PO) Take 2 tablets by mouth daily.    metoprolol  tartrate (LOPRESSOR ) 25 MG tablet Take 1 tablet (25 mg total) by mouth 2 (two) times daily.   Multiple Vitamins-Minerals (OCUVITE ADULT 50+ PO) Take 1 tablet by mouth daily.   Multiple Vitamins-Minerals (PRESERVISION AREDS 2) CAPS Take 1 capsule by mouth daily.   OZEMPIC, 2 MG/DOSE, 8 MG/3ML SOPN Inject 2 mg as directed once a week.   REPATHA SURECLICK 140 MG/ML SOAJ Inject into the skin.   Turmeric 500 MG CAPS Take 1,500 mg by mouth daily.   zinc gluconate 50 MG tablet Take 50 mg by mouth daily.   NEXLETOL 180 MG TABS Take 180 mg by mouth daily. (Patient not taking: Reported on 10/03/2023)   Semaglutide (RYBELSUS) 7 MG TABS Take 7 mg by mouth. (Patient not taking: Reported on 10/03/2023)   SYNJARDY XR 01-999 MG TB24 Take 1 tablet by mouth 2 (two) times daily. (Patient not taking: Reported on 10/03/2023)   No current facility-administered medications for this visit. (Other)   REVIEW OF SYSTEMS: ROS   Negative for: Constitutional, Gastrointestinal, Neurological, Skin, Genitourinary, Musculoskeletal, HENT, Endocrine, Cardiovascular, Eyes, Respiratory, Psychiatric, Allergic/Imm, Heme/Lymph Last edited by Elnor Avelina RAMAN, COT on 10/03/2023  8:30 AM.     ALLERGIES Allergies  Allergen Reactions   Anesthetics, Amide Nausea And Vomiting   Codeine Nausea And Vomiting   PAST MEDICAL HISTORY Past Medical History:  Diagnosis Date   Arthritis  OSTEO   Diabetes mellitus without complication (HCC)    type 2   Family history of adverse reaction to anesthesia    Mother - N/V   Goiter    Hyperlipidemia    Hypertension    PONV (postoperative nausea and vomiting)    Retinopathy    Past Surgical History:  Procedure Laterality Date   ABDOMINAL AORTOGRAM W/LOWER EXTREMITY N/A 03/31/2020   Procedure: ABDOMINAL AORTOGRAM W/LOWER EXTREMITY;  Surgeon: Magda Debby SAILOR, MD;  Location: [facility];  Service: Cardiovascular;  Laterality: N/A;   ABDOMINAL HYSTERECTOMY     AIR/FLUID  EXCHANGE Left 11/30/2014   Procedure: AIR/FLUID EXCHANGE LEFT EYE;  Surgeon: Arley DELENA Ruder, MD;  Location: [facility] OR;  Service: Ophthalmology;  Laterality: Left;   AMPUTATION Left 06/11/2020   Procedure: LEFT GREAT TOE AMPUTATION;  Surgeon: Harden Jerona GAILS, MD;  Location: [facility] OR;  Service: Orthopedics;  Laterality: Left;   AMPUTATION Left 07/13/2021   Procedure: LEFT SECOND AND THIRD TOE AMPUTATION;  Surgeon: Harden Jerona GAILS, MD;  Location: [facility] OR;  Service: Orthopedics;  Laterality: Left;   BACK SURGERY     CATARACT EXTRACTION Bilateral 2014   Dr. Alverta   CESAREAN SECTION     x 2   EYE SURGERY     INJECTION OF SILICONE OIL Right 05/26/2014   Procedure: INJECTION OF SILICONE OIL;  Surgeon: Arley DELENA Ruder, MD;  Location: [facility] OR;  Service: Ophthalmology;  Laterality: Right;   INJECTION OF SILICONE OIL Left 11/30/2014   Procedure: INJECTION OF SILICONE OIL;  Surgeon: Arley DELENA Ruder, MD;  Location: [facility] OR;  Service: Ophthalmology;  Laterality: Left;   LASER PHOTO ABLATION Right 05/26/2014   Procedure: LASER PHOTO ABLATION;  Surgeon: Arley DELENA Ruder, MD;  Location: [facility] OR;  Service: Ophthalmology;  Laterality: Right;   PARS PLANA VITRECTOMY Right 05/26/2014   Procedure: PARS PLANA VITRECTOMY 25 GAUGE;  Surgeon: Arley DELENA Ruder, MD;  Location: [facility] OR;  Service: Ophthalmology;  Laterality: Right;   PARS PLANA VITRECTOMY Left 11/30/2014   Procedure: PARS PLANA VITRECTOMY WITH 25 GAUGE with endolaser;  Surgeon: Arley DELENA Ruder, MD;  Location: [facility] OR;  Service: Ophthalmology;  Laterality: Left;   PERIPHERAL VASCULAR INTERVENTION Left 03/31/2020   Procedure: PERIPHERAL VASCULAR INTERVENTION;  Surgeon: Magda Debby SAILOR, MD;  Location: [facility];  Service: Cardiovascular;  Laterality: Left;  superficial femoral   TONSILLECTOMY     TOTAL HIP ARTHROPLASTY Right 04/21/2014   dr beverley   TOTAL HIP ARTHROPLASTY Right 04/21/2014   Procedure: RIGHT TOTAL HIP ARTHROPLASTY ANTERIOR APPROACH;  Surgeon: Evalene JONETTA beverley, MD;   Location: [facility] OR;  Service: Orthopedics;  Laterality: Right;   TUBAL LIGATION     FAMILY HISTORY Family History  Problem Relation Age of Onset   Healthy Mother    Healthy Father    SOCIAL HISTORY Social History   Tobacco Use   Smoking status: Former   Smokeless tobacco: Never   Tobacco comments:    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   Vaping status: Never Used  Substance Use Topics   Alcohol use: Yes    Comment: pt may have one drink a month   Drug use: No       OPHTHALMIC EXAM:  Base Eye Exam     Visual Acuity (Snellen - Linear)       Right Left   Dist [place] HM 20/200 -2   Dist ph [place] NI 20/200 +2  Tonometry (Tonopen, 8:36 AM)       Right Left   Pressure 19 15         Pupils       Pupils Dark Light Shape React APD   Right PERRL 3 2 Round Minimal None   Left PERRL 3 2 Round Minimal None         Visual Fields       Left Right    Full    Restrictions  Total superior temporal, inferior temporal deficiencies; Partial inner superior nasal, inferior nasal deficiencies         Extraocular Movement       Right Left    Full, Ortho Full, Ortho         Neuro/Psych     Oriented x3: Yes   Mood/Affect: Normal         Dilation     Both eyes: 1.0% Mydriacyl, 2.5% Phenylephrine  @ 8:38 AM           Slit Lamp and Fundus Exam     Slit Lamp Exam       Right Left   Lids/Lashes Dermatochalasis - upper lid, mild MGD Dermatochalasis - upper lid   Conjunctiva/Sclera White and quiet, nasal pingeucula White and quiet   Cornea tear film debris, punctate KP, Well healed temporal cataract wound Trace tear film debris   Anterior Chamber deep, 0.5+cell/pigment deep and clear, No cell or pigment   Iris Round and moderately dilated Round and moderately dilated, No NVI   Lens PC IOL in good position PC IOL in good position   Anterior Vitreous post vitrectomy post vitrectomy, residual anterior hyaloid visible         Fundus Exam       Right Left    Disc 3-4+pallor, Sharp rim, +cupping,  2-3+pallor, +cupping   C/D Ratio 0.8 0.7   Macula Flat, Blunted foveal reflex, central atrophy, mild pigment clumping, No heme or edema Flat, Blunted foveal reflex, diffuse CR atrophy / laser scars   Vessels Severe attenuation, no active NV Severe attenuation, Tortuous   Periphery Attached, dense 360 PRP, no heme Attached, dense 360 PRP, no heme           Refraction     Manifest Refraction   No improvements with refraction           IMAGING AND PROCEDURES  Imaging and Procedures for 10/03/2023  OCT, Retina - OU - Both Eyes       Right Eye Quality was good. Central Foveal Thickness: 157. Progression has been stable. Findings include normal foveal contour, no IRF, no SRF, epiretinal membrane, macular pucker, outer retinal atrophy (Diffuse atrophy with subfoveal thinning, mild ERM and pucker).   Left Eye Quality was good. Central Foveal Thickness: 249. Progression has been stable. Findings include normal foveal contour, no IRF, no SRF, outer retinal atrophy (Diffuse ORA).   Notes *Images captured and stored on drive  Diagnosis / Impression:  OD: Diffuse atrophy with subfoveal thinning / GA, mild ERM and pucker OS: Diffuse ORA  Clinical management:  See below  Abbreviations: NFP - Normal foveal profile. CME - cystoid macular edema. PED - pigment epithelial detachment. IRF - intraretinal fluid. SRF - subretinal fluid. EZ - ellipsoid zone. ERM - epiretinal membrane. ORA - outer retinal atrophy. ORT - outer retinal tubulation. SRHM - subretinal hyper-reflective material. IRHM - intraretinal hyper-reflective material           ASSESSMENT/PLAN:   ICD-10-CM   1.  Proliferative diabetic retinopathy of both eyes without macular edema associated with type 2 diabetes mellitus (HCC)  E11.3593 OCT, Retina - OU - Both Eyes    2. Diabetes mellitus treated with injections of non-insulin  medication (HCC)  E11.9    Z79.85     3. Long term  (current) use of oral hypoglycemic drugs  Z79.84     4. Essential hypertension  I10     5. Hypertensive retinopathy of both eyes  H35.033     6. Pseudophakia, both eyes  Z96.1     7. Primary open angle glaucoma (POAG) of both eyes, severe stage  H40.1133      1-3. Proliferative diabetic retinopathy w/o DME, OU  - former [facility] pt -- transferred care due to insurance  - last A1c 7.4 on 01.06.23, pt reported 6.09 Aug 2023  - s/p PPV/EL/SO (OD: 02.23.16, OS:08.29.16  -- Dr. Elner)  - s/p PPV w/ SOR (2022 -- Dr. Elner) - BCVA HM OD and 20/200 OS - exam shows dense PRP and diffuse atrophy; severe vascular attenuation and disc pallor OU - OCT without diabetic macular edema, both eyes -- stable - discussed findings and prognosis - no retinal or ophthalmic interventions indicated or recommended  - f/u in 1 year for DFE/OCT  4,5. Hypertensive retinopathy OU - discussed importance of tight BP control - monitor  6. Pseudophakia OU  - s/p CE/IOL OU, Dr. Alverta in 2014  - IOL in good position, doing well  - monitor  7. Severe stage POAG  - started cosopt  BID OU on 04.11.24, brimonindine BID OU on 05.23.24  - IOP 19,15  - 3-4+ disc pallor and +cupping   - continue Cosopt  BID OU and Brimonidine  BID OU  - monitor  - f/u 1 yr  Ophthalmic Meds Ordered this visit:  No orders of the defined types were placed in this encounter.   Return in about 1 year (around 10/02/2024) for PDR OU, DFE, OCT.  There are no Patient Instructions on file for this visit.  Explained the diagnoses, plan, and follow up with the patient and they expressed understanding.  Patient expressed understanding of the importance of proper follow up care.   This document serves as a record of services personally performed by [place] JUDITHANN Hans, MD, PhD. It was created on their behalf by Almetta Pesa, an ophthalmic technician. The creation of this record is the provider's dictation and/or activities during the visit.     Electronically signed by: Almetta Pesa, OA, 10/03/23  4:09 PM  Red[place] JUDITHANN Hans, M.D., Ph.D. Diseases & Surgery of the Retina and Vitreous [facility] 10/03/2023  I have reviewed the above documentation for accuracy and completeness, and I agree with the above. Redell JUDITHANN Hans, M.D., Ph.D. 10/03/23 4:09 PM   Abbreviations: M myopia (nearsighted); A astigmatism; H hyperopia (farsighted); P presbyopia; Mrx spectacle prescription;  CTL contact lenses; OD right eye; OS left eye; OU both eyes  XT exotropia; ET esotropia; PEK punctate epithelial keratitis; PEE punctate epithelial erosions; DES dry eye syndrome; MGD meibomian gland dysfunction; ATs artificial tears; PFAT's preservative free artificial tears; NSC nuclear sclerotic cataract; PSC posterior subcapsular cataract; ERM epi-retinal membrane; PVD posterior vitreous detachment; RD retinal detachment; DM diabetes mellitus; DR diabetic retinopathy; NPDR non-proliferative diabetic retinopathy; PDR proliferative diabetic retinopathy; CSME clinically significant macular edema; DME diabetic macular edema; dbh dot blot hemorrhages; CWS cotton wool spot; POAG primary open angle glaucoma; C/D cup-to-disc ratio; HVF humphrey visual field; GVF goldmann visual  field; OCT optical coherence tomography; IOP intraocular pressure; BRVO Branch retinal vein occlusion; CRVO central retinal vein occlusion; CRAO central retinal artery occlusion; BRAO branch retinal artery occlusion; RT retinal tear; SB scleral buckle; PPV pars plana vitrectomy; VH Vitreous hemorrhage; PRP panretinal laser photocoagulation; IVK intravitreal kenalog; VMT vitreomacular traction; MH Macular hole;  NVD neovascularization of the disc; NVE neovascularization elsewhere; AREDS age related eye disease study; ARMD age related macular degeneration; POAG primary open angle glaucoma; EBMD epithelial/anterior basement membrane dystrophy; ACIOL anterior chamber intraocular lens; IOL  intraocular lens; PCIOL posterior chamber intraocular lens; Phaco/IOL phacoemulsification with intraocular lens placement; PRK photorefractive keratectomy; LASIK laser assisted in situ keratomileusis; HTN hypertension; DM diabetes mellitus; COPD chronic obstructive pulmonary disease

## 2023-10-03 ENCOUNTER — Ambulatory Visit (INDEPENDENT_AMBULATORY_CARE_PROVIDER_SITE_OTHER): Payer: Medicare Other | Admitting: Ophthalmology

## 2023-10-03 ENCOUNTER — Encounter (INDEPENDENT_AMBULATORY_CARE_PROVIDER_SITE_OTHER): Payer: Self-pay | Admitting: Ophthalmology

## 2023-10-03 DIAGNOSIS — I10 Essential (primary) hypertension: Secondary | ICD-10-CM

## 2023-10-03 DIAGNOSIS — Z961 Presence of intraocular lens: Secondary | ICD-10-CM

## 2023-10-03 DIAGNOSIS — Z7985 Long-term (current) use of injectable non-insulin antidiabetic drugs: Secondary | ICD-10-CM

## 2023-10-03 DIAGNOSIS — H35033 Hypertensive retinopathy, bilateral: Secondary | ICD-10-CM

## 2023-10-03 DIAGNOSIS — E113593 Type 2 diabetes mellitus with proliferative diabetic retinopathy without macular edema, bilateral: Secondary | ICD-10-CM | POA: Diagnosis not present

## 2023-10-03 DIAGNOSIS — Z7984 Long term (current) use of oral hypoglycemic drugs: Secondary | ICD-10-CM

## 2023-10-03 DIAGNOSIS — H401133 Primary open-angle glaucoma, bilateral, severe stage: Secondary | ICD-10-CM | POA: Diagnosis not present

## 2023-10-24 ENCOUNTER — Other Ambulatory Visit (INDEPENDENT_AMBULATORY_CARE_PROVIDER_SITE_OTHER): Payer: Self-pay | Admitting: Ophthalmology

## 2023-10-30 ENCOUNTER — Other Ambulatory Visit (INDEPENDENT_AMBULATORY_CARE_PROVIDER_SITE_OTHER): Payer: Self-pay

## 2023-10-30 MED ORDER — BRIMONIDINE TARTRATE 0.2 % OP SOLN
1.0000 [drp] | Freq: Two times a day (BID) | OPHTHALMIC | 10 refills | Status: AC
Start: 2023-10-30 — End: ?

## 2023-10-30 MED ORDER — DORZOLAMIDE HCL-TIMOLOL MAL 2-0.5 % OP SOLN: 1.0000 [drp] | Freq: Two times a day (BID) | OPHTHALMIC | 10 refills | Status: AC

## 2024-10-01 ENCOUNTER — Encounter (INDEPENDENT_AMBULATORY_CARE_PROVIDER_SITE_OTHER): Admitting: Ophthalmology
